# Patient Record
Sex: Male | Born: 1970 | Race: Black or African American | Hispanic: No | Marital: Single | Smoking: Former smoker
Health system: Southern US, Community
[De-identification: ages and names within clinical notes are randomized; demographics above are authoritative.]

## PROBLEM LIST (undated history)

## (undated) DIAGNOSIS — I429 Cardiomyopathy, unspecified: Secondary | ICD-10-CM

## (undated) DIAGNOSIS — I509 Heart failure, unspecified: Secondary | ICD-10-CM

## (undated) DIAGNOSIS — I10 Essential (primary) hypertension: Secondary | ICD-10-CM

## (undated) DIAGNOSIS — I639 Cerebral infarction, unspecified: Secondary | ICD-10-CM

## (undated) DIAGNOSIS — M1990 Unspecified osteoarthritis, unspecified site: Secondary | ICD-10-CM

## (undated) DIAGNOSIS — I428 Other cardiomyopathies: Secondary | ICD-10-CM

## (undated) HISTORY — DX: Cerebral infarction, unspecified: I63.9

---

## 2014-02-01 ENCOUNTER — Encounter (HOSPITAL_COMMUNITY): Payer: Self-pay

## 2014-02-01 ENCOUNTER — Ambulatory Visit (HOSPITAL_COMMUNITY)
Admission: RE | Admit: 2014-02-01 | Discharge: 2014-02-01 | Disposition: A | Discharge status: Home / Self Care | Payer: Medicaid Other | Source: Ambulatory Visit | Attending: Internal Medicine | Admitting: Internal Medicine

## 2014-02-01 VITALS — BP 112/58 | HR 95 | Wt 156.5 lb

## 2014-02-01 DIAGNOSIS — I428 Other cardiomyopathies: Secondary | ICD-10-CM | POA: Diagnosis not present

## 2014-02-01 DIAGNOSIS — I429 Cardiomyopathy, unspecified: Secondary | ICD-10-CM

## 2014-02-01 DIAGNOSIS — M109 Gout, unspecified: Secondary | ICD-10-CM | POA: Diagnosis not present

## 2014-02-01 DIAGNOSIS — E859 Amyloidosis, unspecified: Secondary | ICD-10-CM | POA: Insufficient documentation

## 2014-02-01 HISTORY — DX: Cardiomyopathy, unspecified: I42.9

## 2014-02-01 HISTORY — DX: Other cardiomyopathies: I42.8

## 2014-02-01 HISTORY — DX: Essential (primary) hypertension: I10

## 2014-02-01 HISTORY — DX: Unspecified osteoarthritis, unspecified site: M19.90

## 2014-02-01 LAB — CBC WITH DIFFERENTIAL/PLATELET
Basophils Absolute: 0 10*3/uL (ref 0.0–0.1)
Basophils Relative: 1 % (ref 0–1)
Eosinophils Absolute: 0 10*3/uL (ref 0.0–0.7)
Eosinophils Relative: 0 % (ref 0–5)
HCT: 44.9 % (ref 39.0–52.0)
Hemoglobin: 16.1 g/dL (ref 13.0–17.0)
Lymphocytes Relative: 35 % (ref 12–46)
Lymphs Abs: 1.4 10*3/uL (ref 0.7–4.0)
MCH: 31.8 pg (ref 26.0–34.0)
MCHC: 35.9 g/dL (ref 30.0–36.0)
MCV: 88.6 fL (ref 78.0–100.0)
Monocytes Absolute: 0.4 10*3/uL (ref 0.1–1.0)
Monocytes Relative: 9 % (ref 3–12)
Neutro Abs: 2.2 10*3/uL (ref 1.7–7.7)
Neutrophils Relative %: 55 % (ref 43–77)
Platelets: 168 10*3/uL (ref 150–400)
RBC: 5.07 MIL/uL (ref 4.22–5.81)
RDW: 15.1 % (ref 11.5–15.5)
WBC: 4 10*3/uL (ref 4.0–10.5)

## 2014-02-01 LAB — COMPREHENSIVE METABOLIC PANEL
ALT: 22 U/L (ref 0–53)
AST: 35 U/L (ref 0–37)
Albumin: 4 g/dL (ref 3.5–5.2)
Alkaline Phosphatase: 116 U/L (ref 39–117)
BUN: 8 mg/dL (ref 6–23)
CO2: 21 mEq/L (ref 19–32)
Calcium: 9.2 mg/dL (ref 8.4–10.5)
Chloride: 103 mEq/L (ref 96–112)
Creatinine, Ser: 0.72 mg/dL (ref 0.50–1.35)
GFR calc Af Amer: >90 mL/min (ref >90)
GFR calc non Af Amer: >90 mL/min (ref >90)
Glucose, Bld: 92 mg/dL (ref 70–99)
Potassium: 4.2 mEq/L (ref 3.7–5.3)
Sodium: 141 mEq/L (ref 137–147)
Total Bilirubin: 0.7 mg/dL (ref 0.3–1.2)
Total Protein: 7.9 g/dL (ref 6.0–8.3)

## 2014-02-01 LAB — SEDIMENTATION RATE: Sed Rate: 0 mm/hr (ref 0–16)

## 2014-02-01 LAB — URIC ACID: Uric Acid, Serum: 6.5 mg/dL (ref 4.0–7.8)

## 2014-02-01 LAB — RHEUMATOID FACTOR: Rhuematoid fact SerPl-aCnc: <10 IU/mL (ref <=14)

## 2014-02-01 MED ORDER — PREDNISONE 20 MG PO TABS: 40.0000 mg | ORAL_TABLET | Freq: Every day | ORAL | Status: AC

## 2014-02-01 MED ORDER — ALLOPURINOL 100 MG PO TABS: 200.0000 mg | ORAL_TABLET | Freq: Every day | ORAL | Status: DC

## 2014-02-01 NOTE — Progress Notes (Signed)
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 Gershon Mussel Povsic at [facility] had OHTx at 32 age 61 months old due to reported infiltrative CM (exact diagnosis not reported in records I have). Apparently after that, Charles Hawkins underwent cardiac workup and cMRI suggested infiltrative CM with normal EF. Has had exhaustive w/u since that time under the guidance of Dr. Loyal Buba at [facility] without clear diagnosis. 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 Wife got job in [employer] with [employer] and [place]   Say Dr. Amalia Hailey in hematology and [name] in Rheumatology and he was apaprently told he did not have a systemic problem. Marland Kitchen   cMRI - LVEF 63% moderate concentric LVH 1.6 cm. RV normal. Biatrial enlargement, Diffuse infiltrative process involving both ventricles EM biopsy 11/14 - negative Fat pad biopsy - neagtive Serologies all normal Had ETT in 9/14 (no CPX). 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 After about 20 mins of walking back and legs start hurting and limit him. No edema. No syncope. Occasional dizziness. Denies DOE. He denies FHx of HF except for his son.   He is very anxious and worried he is going to die.  Review of Systems: [y] = yes, [ ]  = no   General: Weight gain [ ] ; Weight loss [ ] ; Anorexia [ ] ; Fatigue Blue.Reese ]; Fever [ ] ; Chills [ ] ; Weakness [ ]   Cardiac: Chest pain/pressure [ ] ; Resting SOB [ ] ; Exertional SOB [ ] ; Orthopnea [ ] ; Pedal Edema [ ] ; Palpitations [ ] ; Syncope [ ] ; Presyncope [ ] ; Paroxysmal nocturnal dyspnea[ ]   Pulmonary: Cough [ ] ; Wheezing[ ] ; Hemoptysis[ ] ; Sputum [ ] ; Snoring [ ]   GI: Vomiting[ ] ; Dysphagia[ ] ; Melena[ ] ; Hematochezia [ ] ; Heartburn[ ] ; Abdominal pain [ ] ; Constipation [ ] ;  Diarrhea [ ] ; BRBPR [ ]   GU: Hematuria[ ] ; Dysuria [ ] ; Nocturia[ ]   Vascular: Pain in legs with walking Blue.Reese ]; Pain in feet with lying flat [ ] ; Non-healing sores [ ] ; Stroke [ ] ; TIA [ ] ; Slurred speech [ ] ;  Neuro: Headaches[ ] ; Vertigo[ ] ; Seizures[ ] ; Paresthesias[ ] ;Blurred vision [ ] ; Diplopia [ ] ; Vision changes [ ]   Ortho/Skin: Arthritis Blue.Reese ]; Joint pain Blue.Reese ]; Muscle pain [ ] ; Joint swelling [ y]; Back Pain [ ] ; Rash [ ]   Psych: Depression[ ] ; Anxiety[y ]  Heme: Bleeding problems [ ] ; Clotting disorders [ ] ; Anemia [ ]   Endocrine: Diabetes [ ] ; Thyroid dysfunction[ ]    Past Medical History  Diagnosis Date  . Infiltrative cardiomyopathy     EF normal. Multiple areas of subendocardial enhancement on MRI. Fat pad and myocardial biopsy negative at [facility].   Marland Kitchen HTN (hypertension)   . Arthritis     Current Outpatient Prescriptions  Medication Sig Dispense Refill  . amLODipine (NORVASC) 5 MG tablet Take 5 mg by mouth daily.      . Multiple Vitamin (MULTIVITAMIN) tablet Take 1 tablet by mouth daily.      Marland Kitchen allopurinol (ZYLOPRIM) 100 MG tablet Take 2 tablets (200 mg total) by mouth daily.  60 tablet  6   No current facility-administered medications for this  encounter.    No Known Allergies  History   Social History  . Marital Status: Single    Spouse Name: N/A    Number of Children: N/A  . Years of Education: N/A   Occupational History  . Not on file.   Social History Main Topics  . Smoking status: Current Every Day Smoker -- 0.25 packs/day    Types: Cigarettes  . Smokeless tobacco: Not on file  . Alcohol Use: Yes     Comment: occasionally  . Drug Use: Yes     Comment: marijuana occasionally  . Sexual Activity: Not on file   Other Topics Concern  . Not on file   Social History Narrative  . No narrative on file    Family History  Problem Relation Age of Onset  . Heart failure Son 1    s/p heart transplant  . Stroke Brother 66    Deceased  . Stroke Mother   .  Hypertension Father     PHYSICAL EXAM: Filed Vitals:   02/01/14 1042  BP: 112/58  Pulse: 95  Weight: 156 lb 8 oz (70.988 kg)  SpO2: 100%    General:  Well appearing. No respiratory difficulty HEENT: normal Neck: supple. no JVD. Carotids 2+ bilat; no bruits. No lymphadenopathy or thryomegaly appreciated. Cor: PMI nondisplaced.tachy + s4 no murmur Lungs: clear Abdomen: soft, nontender, nondistended. No hepatosplenomegaly. No bruits or masses. Good bowel sounds. Extremities: braces on LE. no cyanosis, clubbing, rash, edema L 4-5 th fingers DIP and PIP swollen tender. + gout nodule on L elbow,  Neuro: alert & oriented x 3, cranial nerves grossly intact. moves all 4 extremities w/o difficulty. Affect pleasant.   ASSESSMENT & PLAN:  1. 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 Gout, acute 3. HTN - controlled 4. Arthritis  Very complicated situation. cMRI is very suggestive of infiltrative process but an extensive, multidisciplinary work-up at [facility] including endomyocardial biopsy has been completely unrevealing. In addition his LV function remains normal and he has no clinical signs or symptoms of HF. On exam today has evidence of acute gout in L hand. We will start with CPX testing as tolearted by his arthritis to assess for HF limitation. We then have to decide between repeating his cMRI or EM biopsy. I think it may be reasonable to repeat cMRI to look for persistent hyperenhancement versus resolving myocarditis. If hyperenhancement persists will need repeat EM biopsy. I will also attempt to get records from [place] to get further details on his work-up to date. Will start prednisone 40mg  daily for 3 days for acute gout and add allopurinol 200 daily. Check labs including uric acid.   Time spent 50 minutes. With about 1/2 that time discussing the above with patient and the other half reviewing records that came with him from [place].   Shaune Pascal Leman Martinek,MD 10:52 PM

## 2014-02-01 NOTE — Patient Instructions (Signed)
Prednisone 40 mg (2 tabs) daily for 3 days   Allopurinol 200 mg (2 tabs) daily  Labs today  Your physician has recommended that you have a cardiopulmonary stress test (CPX). CPX testing is a non-invasive measurement of heart and lung function. It replaces a traditional treadmill stress test. This type of test provides a tremendous amount of information that relates not only to your present condition but also for future outcomes. This test combines measurements of you ventilation, respiratory gas exchange in the lungs, electrocardiogram (EKG), blood pressure and physical response before, during, and following an exercise protocol.  Your physician recommends that you schedule a follow-up appointment in: 3-4 weeks

## 2014-02-02 LAB — ANA: Anti Nuclear Antibody(ANA): NEGATIVE

## 2014-02-06 ENCOUNTER — Encounter (HOSPITAL_COMMUNITY): Payer: Self-pay

## 2014-02-06 DIAGNOSIS — I429 Cardiomyopathy, unspecified: Secondary | ICD-10-CM | POA: Insufficient documentation

## 2014-02-06 DIAGNOSIS — I428 Other cardiomyopathies: Secondary | ICD-10-CM | POA: Insufficient documentation

## 2014-02-06 DIAGNOSIS — M109 Gout, unspecified: Secondary | ICD-10-CM | POA: Insufficient documentation

## 2014-02-15 ENCOUNTER — Ambulatory Visit (HOSPITAL_COMMUNITY): Payer: Medicaid Other | Attending: Internal Medicine

## 2014-02-15 DIAGNOSIS — I428 Other cardiomyopathies: Secondary | ICD-10-CM

## 2014-02-15 DIAGNOSIS — E859 Amyloidosis, unspecified: Secondary | ICD-10-CM | POA: Insufficient documentation

## 2014-02-22 ENCOUNTER — Encounter (HOSPITAL_COMMUNITY): Payer: Medicaid Other

## 2014-02-22 ENCOUNTER — Other Ambulatory Visit (HOSPITAL_COMMUNITY): Payer: Self-pay | Admitting: Cardiology

## 2014-02-22 DIAGNOSIS — I428 Other cardiomyopathies: Secondary | ICD-10-CM

## 2014-02-26 ENCOUNTER — Encounter: Payer: Self-pay | Admitting: Cardiology

## 2014-03-03 ENCOUNTER — Encounter (HOSPITAL_COMMUNITY): Payer: Self-pay

## 2014-03-03 ENCOUNTER — Ambulatory Visit (HOSPITAL_COMMUNITY)
Admission: RE | Admit: 2014-03-03 | Discharge: 2014-03-03 | Disposition: A | Discharge status: Home / Self Care | Payer: Medicaid Other | Source: Ambulatory Visit | Attending: Internal Medicine | Admitting: Internal Medicine

## 2014-03-03 VITALS — BP 90/64 | HR 97 | Wt 151.4 lb

## 2014-03-03 DIAGNOSIS — I428 Other cardiomyopathies: Secondary | ICD-10-CM | POA: Diagnosis not present

## 2014-03-03 DIAGNOSIS — I509 Heart failure, unspecified: Secondary | ICD-10-CM | POA: Insufficient documentation

## 2014-03-03 DIAGNOSIS — I5033 Acute on chronic diastolic (congestive) heart failure: Secondary | ICD-10-CM | POA: Insufficient documentation

## 2014-03-03 DIAGNOSIS — I5032 Chronic diastolic (congestive) heart failure: Secondary | ICD-10-CM | POA: Diagnosis not present

## 2014-03-03 DIAGNOSIS — I10 Essential (primary) hypertension: Secondary | ICD-10-CM | POA: Insufficient documentation

## 2014-03-03 DIAGNOSIS — I429 Cardiomyopathy, unspecified: Secondary | ICD-10-CM

## 2014-03-03 DIAGNOSIS — R002 Palpitations: Secondary | ICD-10-CM | POA: Diagnosis not present

## 2014-03-03 MED ORDER — CARVEDILOL 3.125 MG PO TABS: 3.1250 mg | ORAL_TABLET | Freq: Two times a day (BID) | ORAL | Status: DC

## 2014-03-03 NOTE — Progress Notes (Signed)
Patient ID: Charles Hawkins, male   DOB: Apr 05, 1971, 43 y.o.   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 Gershon Mussel Povsic at [facility] for further evaluation. I first saw him in 4/15.   Son had OHTx at 22 age 27 months old due to reported infiltrative CM (exact diagnosis not reported in records I have). Apparently after that, Charles Hawkins underwent cardiac workup and cMRI suggested infiltrative CM with normal EF. Has had exhaustive w/u since that time under the guidance of Dr. Loyal Buba at [facility] without clear diagnosis. 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 Wife got job in [employer] with [employer] and [place]. He denies FHx of HF except for his son  Say Dr. Amalia Hailey in hematology and [name] in Rheumatology and he was apaprently told he did not have a systemic problem.   cMRI - LVEF 63% moderate concentric LVH 1.6 cm. RV normal. Biatrial enlargement, Diffuse infiltrative process involving both ventricles EM biopsy 11/14 - negative Fat pad biopsy - neagtive Serologies all normal Had ETT in 9/14 (no CPX). 7.6 METs HR 76-156  He is here for follow-up. Gets winded with mild activity. Occasional episodes of chest pressure can happen at any time. Mild DOE. Losing weight.  No edema. Occasional dizziness. Frequent palpitations + presyncope. Lasts 2-3 minutes. BP remains low. Schedule for repeat cMRI this month.   Had CPX 4/15 which showed significant cardiac limitation  Resting HR: 75 Peak HR: 130 (72% age predicted max HR) BP rest: 102/58 BP peak: 128/62 Peak VO2: 13.9 (38.8% predicted peak VO2) VE/VCO2 slope: 41.4 OUES: 1.09 Peak RER: 1.13 Ventilatory Threshold: 10.2 (28.5% predicted peak VO2) Peak RR 32 Peak Ventilation: 46.3 VE/MVV: 32.2% PETCO2 at peak: 26 O2pulse: 8 (57% predicted O2pulse)   Current Outpatient  Prescriptions on File Prior to Encounter  Medication Sig Dispense Refill  . allopurinol (ZYLOPRIM) 100 MG tablet Take 2 tablets (200 mg total) by mouth daily.  60 tablet  6  . amLODipine (NORVASC) 5 MG tablet Take 5 mg by mouth daily.      . Multiple Vitamin (MULTIVITAMIN) tablet Take 1 tablet by mouth daily.       No current facility-administered medications on file prior to encounter.    PHYSICAL EXAM: Filed Vitals:   03/03/14 0947  BP: 90/64  Pulse: 97  Weight: 151 lb 6.4 oz (68.675 kg)  SpO2: 97%    General:  Well appearing. No respiratory difficulty. Walks with canf HEENT: normal Neck: supple. no JVD. Carotids 2+ bilat; no bruits. No lymphadenopathy or thryomegaly appreciated. Cor: PMI nondisplaced.RRR no murmur Lungs: clear Abdomen: soft, nontender, nondistended. No hepatosplenomegaly. No bruits or masses. Good bowel sounds. Extremities: braces on LE. no cyanosis, clubbing, rash, edema + gout nodules  Neuro: alert & oriented x 3, cranial nerves grossly intact. moves all 4 extremities w/o difficulty. Affect pleasant.   ASSESSMENT & PLAN:  1. 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 Chronic diastolic HF 3. Palpitations with presyncope 4. Gout, 5. HTN -  BP now low   Very complicated situation. cMRI is very suggestive of infiltrative process but an extensive, multidisciplinary work-up at [facility] including endomyocardial biopsy has been completely unrevealing. However, CPX reveals severe cardiac limitation. Will repeat cMRI in next week or two and then refer back  to [facility] for repeat endomyocardial biopsy. He is at high risk for cardiac arrhythmias will place event monitor to evaluate palpitations. Stop amlodipine. Add low dose carvedilol 3.125 bid.   Shaune Pascal Iori Gigante,MD 10:23 AM

## 2014-03-03 NOTE — Patient Instructions (Signed)
Stop Amlodipine (Norvasc)  Start Carvedilol 3.125 mg Twice daily   Your physician has recommended that you wear an event monitor. Event monitors are medical devices that record the heart's electrical activity. Doctors most often [place] these monitors to diagnose arrhythmias. Arrhythmias are problems with the speed or rhythm of the heartbeat. The monitor is a small, portable device. You can wear one while you do your normal daily activities. This is usually used to diagnose what is causing palpitations/syncope (passing out).  Your physician recommends that you schedule a follow-up appointment in: 3-4 weeks  Cardiac MRI is scheduled for 5/18 we will try to move this up

## 2014-03-03 NOTE — Addendum Note (Signed)
Encounter addended by: Scarlette Calico, RN on: 03/03/2014 10:47 AM<BR>     Documentation filed: Medications, Patient Instructions Section, Orders

## 2014-03-05 ENCOUNTER — Ambulatory Visit (HOSPITAL_COMMUNITY)
Admission: RE | Admit: 2014-03-05 | Discharge: 2014-03-05 | Disposition: A | Discharge status: Home / Self Care | Payer: Medicaid Other | Source: Ambulatory Visit | Attending: Cardiology | Admitting: Cardiology

## 2014-03-05 DIAGNOSIS — I428 Other cardiomyopathies: Secondary | ICD-10-CM

## 2014-03-05 MED ORDER — GADOBENATE DIMEGLUMINE 529 MG/ML IV SOLN
20.0000 mL | Freq: Once | INTRAVENOUS | Status: AC
  Administered 2014-03-05: 20 mL via INTRAVENOUS

## 2014-03-08 ENCOUNTER — Encounter: Payer: Self-pay | Admitting: *Deleted

## 2014-03-08 ENCOUNTER — Encounter (INDEPENDENT_AMBULATORY_CARE_PROVIDER_SITE_OTHER): Payer: Medicaid Other

## 2014-03-08 DIAGNOSIS — R002 Palpitations: Secondary | ICD-10-CM

## 2014-03-08 NOTE — Progress Notes (Signed)
Patient ID: Charles Hawkins, male   DOB: 06/30/1971, 43 y.o.   MRN: [id number] E-Cardio verite 30 day cardiac event monitor applied to patient.

## 2014-03-15 ENCOUNTER — Inpatient Hospital Stay (HOSPITAL_COMMUNITY): Admission: RE | Admit: 2014-03-15 | Payer: Medicaid Other | Source: Ambulatory Visit

## 2014-04-01 ENCOUNTER — Inpatient Hospital Stay (HOSPITAL_COMMUNITY): Admission: RE | Admit: 2014-04-01 | Payer: Medicaid Other | Source: Ambulatory Visit

## 2014-05-14 ENCOUNTER — Telehealth (HOSPITAL_COMMUNITY): Payer: Self-pay | Admitting: *Deleted

## 2014-05-14 NOTE — Telephone Encounter (Signed)
Per Dr Vaughan Browner pt has been referred to [facility] for Heart Transplant Eval, records faxed to them at [phone]

## 2014-11-08 ENCOUNTER — Ambulatory Visit (HOSPITAL_COMMUNITY)
Admission: RE | Admit: 2014-11-08 | Discharge: 2014-11-08 | Disposition: A | Discharge status: Home / Self Care | Payer: Medicaid Other | Source: Ambulatory Visit | Attending: Internal Medicine | Admitting: Internal Medicine

## 2014-11-08 ENCOUNTER — Encounter (HOSPITAL_COMMUNITY): Payer: Self-pay

## 2014-11-08 VITALS — BP 120/78 | HR 112 | Wt 161.4 lb

## 2014-11-08 DIAGNOSIS — I493 Ventricular premature depolarization: Secondary | ICD-10-CM | POA: Insufficient documentation

## 2014-11-08 DIAGNOSIS — Z79899 Other long term (current) drug therapy: Secondary | ICD-10-CM | POA: Insufficient documentation

## 2014-11-08 DIAGNOSIS — M109 Gout, unspecified: Secondary | ICD-10-CM | POA: Diagnosis not present

## 2014-11-08 DIAGNOSIS — I5032 Chronic diastolic (congestive) heart failure: Secondary | ICD-10-CM | POA: Insufficient documentation

## 2014-11-08 DIAGNOSIS — I425 Other restrictive cardiomyopathy: Secondary | ICD-10-CM | POA: Insufficient documentation

## 2014-11-08 DIAGNOSIS — I10 Essential (primary) hypertension: Secondary | ICD-10-CM | POA: Insufficient documentation

## 2014-11-08 DIAGNOSIS — I517 Cardiomegaly: Secondary | ICD-10-CM | POA: Insufficient documentation

## 2014-11-08 DIAGNOSIS — I4589 Other specified conduction disorders: Secondary | ICD-10-CM | POA: Insufficient documentation

## 2014-11-08 DIAGNOSIS — R002 Palpitations: Secondary | ICD-10-CM | POA: Insufficient documentation

## 2014-11-08 DIAGNOSIS — Z720 Tobacco use: Secondary | ICD-10-CM | POA: Diagnosis not present

## 2014-11-08 DIAGNOSIS — R000 Tachycardia, unspecified: Secondary | ICD-10-CM | POA: Insufficient documentation

## 2014-11-08 LAB — BASIC METABOLIC PANEL
Anion gap: 7 (ref 5–15)
BUN: 6 mg/dL (ref 6–23)
CO2: 24 mmol/L (ref 19–32)
Calcium: 8.8 mg/dL (ref 8.4–10.5)
Chloride: 101 mEq/L (ref 96–112)
Creatinine, Ser: 0.84 mg/dL (ref 0.50–1.35)
GFR calc Af Amer: >90 mL/min (ref >90)
GFR calc non Af Amer: >90 mL/min (ref >90)
Glucose, Bld: 93 mg/dL (ref 70–99)
Potassium: 4.1 mmol/L (ref 3.5–5.1)
Sodium: 132 mmol/L — ABNORMAL LOW (ref 135–145)

## 2014-11-08 LAB — BRAIN NATRIURETIC PEPTIDE: B Natriuretic Peptide: 2358.6 pg/mL — ABNORMAL HIGH (ref 0.0–100.0)

## 2014-11-08 MED ORDER — CARVEDILOL 6.25 MG PO TABS: 3.1250 mg | ORAL_TABLET | Freq: Two times a day (BID) | ORAL | Status: DC

## 2014-11-08 MED ORDER — ALDACTONE 25 MG PO TABS: 25.0000 mg | ORAL_TABLET | Freq: Every day | ORAL | Status: DC

## 2014-11-08 NOTE — Addendum Note (Signed)
Encounter addended by: Kerry Dory, CMA on: 11/08/2014 10:59 AM<BR>     Documentation filed: Dx Association, Patient Instructions Section, Orders

## 2014-11-08 NOTE — Patient Instructions (Addendum)
Your physician has requested that you have an echocardiogram. Echocardiography is a painless test that uses sound waves to create images of your heart. It provides your doctor with information about the size and shape of your heart and how well your heart's chambers and valves are working. This procedure takes approximately one hour. There are no restrictions for this procedure.  Your physician has recommended that you wear an event monitor. Event monitors are medical devices that record the heart's electrical activity. Doctors most often [place] these monitors to diagnose arrhythmias. Arrhythmias are problems with the speed or rhythm of the heartbeat. The monitor is a small, portable device. You can wear one while you do your normal daily activities. This is usually used to diagnose what is causing palpitations/syncope (passing out).   INCREASE Carvedilol to 6.25 mg twice a day START Spironolactone 25 mg daily  Labs today and again in 1 week (BMET)  Your physician recommends that you schedule a follow-up appointment in: 3-4 weeks  Do the following things EVERYDAY: 1) Weigh yourself in the morning before breakfast. Write it down and keep it in a log. 2) Take your medicines as prescribed 3) Eat low salt foods-Limit salt (sodium) to 2000 mg per day.  4) Stay as active as you can everyday 5) Limit all fluids for the day to less than 2 liters 6)

## 2014-11-08 NOTE — Addendum Note (Signed)
Encounter addended by: Jolaine Artist, MD on: 11/08/2014  2:59 PM<BR>     Documentation filed: Notes Section

## 2014-11-08 NOTE — Progress Notes (Addendum)
Patient ID: Charles Hawkins, male   DOB: February 11, 1971, 44 y.o.   MRN: [id number]   ADVANCED HF CLINIC CONSULT NOTE  HPI:  Charles Hawkins is a 44 y/o male with h/o HTN, h/o bilateral club feet s/p surgical repair, gout, arthritis and restrictive (?infiltrative) CM referred by Dr. Gershon Mussel Povsic at [facility] for further evaluation. I first saw him in 4/15.   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 Apparently after that, Charles Hawkins underwent cardiac workup and cMRI suggested infiltrative CM with normal EF. Has had exhaustive w/u since that time under the guidance of Dr. Loyal Buba at [facility] without clear diagnosis. 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 Wife got job in [employer] with [employer] and [place]. He denies FHx of HF except for his son  Say Dr. Amalia Hailey in hematology and [name] in Rheumatology and he was apaprently told he did not have a systemic problem.   cMRI - LVEF 63% moderate concentric LVH 1.6 cm. RV normal. Biatrial enlargement, Diffuse infiltrative process involving both ventricles EM biopsy 11/14 - negative Fat pad biopsy - neagtive Serologies all normal Had ETT in 9/14 (no CPX). 7.6 METs HR 76-156 Event monitor 5/15; SR with PVCs Repeat cMRI in 5/15      1. Normal LV size and systolic function, EF 89%. Mild LV hypertrophy. Normal RV size and systolic function.    2. Moderate to severe biatrial enlargement.    3. Evidence for diffuse infiltrative process by delayed enhancement imaging, possible cardiac amyloidosis.  Had CPX 4/15 which showed significant cardiac limitation  Resting HR: 75 Peak HR: 130 (72% age predicted max HR) BP rest: 102/58 BP peak: 128/62 Peak VO2: 13.9 (38.8% predicted peak VO2) VE/VCO2 slope: 41.4 OUES: 1.09 Peak RER: 1.13 Ventilatory Threshold: 10.2 (28.5% predicted peak VO2) Peak RR 32 Peak Ventilation: 46.3 VE/MVV: 32.2% PETCO2 at peak: 26 O2pulse: 8 (57%  predicted O2pulse)  Follow-up: Saw Dr. Posey Pronto at [facility] on 06/04/14. They suggested stopping smoking and starting process for OHTx work-up. He has not followed up. Says he is overwhelmed with the process and very anxious and having hard time sleeping. Not weighing everyday. Weight 10 pounds up here since May. + edema. +DOE on 100 yards. No syncope. + severe arthritis. Continue to have tachypalpitations every day. Down to 2 cigarettes per day.    Current Outpatient Prescriptions on File Prior to Encounter  Medication Sig Dispense Refill  . carvedilol (COREG) 3.125 MG tablet Take 1 tablet (3.125 mg total) by mouth 2 (two) times daily. 60 tablet 3  . Multiple Vitamin (MULTIVITAMIN) tablet Take 1 tablet by mouth daily.     No current facility-administered medications on file prior to encounter.    PHYSICAL EXAM: Filed Vitals:   11/08/14 1029  BP: 120/78  Pulse: 112  Weight: 161 lb 6.4 oz (73.211 kg)  SpO2: 95%    General:  Well appearing. No respiratory difficulty.  HEENT: normal Neck: supple. JVP 9-10. Carotids 2+ bilat; no bruits. No lymphadenopathy or thryomegaly appreciated. Cor: PMI nondisplaced.Tachy regular. No s3 Lungs: clear Abdomen: soft, nontender, nondistended. No hepatosplenomegaly. No bruits or masses. Good bowel sounds. Extremities: braces on LE. no cyanosis, clubbing, rash, tr-1+ edema L>R  + gout nodules  Neuro: alert & oriented x 3, cranial nerves grossly intact. moves all 4 extremities w/o difficulty. Affect pleasant.   ASSESSMENT & PLAN:  1. 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 Chronic diastolic HF 3. Palpitations 4. Gout, 5. HTN 6. Tobacco use  cMRI is very suggestive of infiltrative process but an extensive, multidisciplinary work-up at [facility] including endomyocardial biopsy which has not confirmed infiltrative process. He remains with NYHA Class III symptoms with volume overload. Will start spiro  25 daily. Increase carvedilol to 6.25 bid. Will repeat echo. Stressed need to start transplant process through [place] will likely need repeat CPX as well. Add ACE-I at next visit if labs stable after spiro. Blood work today and in 10 days.   RTC in 3-4 weeks.   Lashawn Bromwell,MD 10:33 AM  Addendum: ECG shows sinus rhythm with run of atrial tach. Will place event monitor.   Pamila Mendibles,MD 2:59 PM

## 2014-11-08 NOTE — Addendum Note (Signed)
Encounter addended by: Kerry Dory, CMA on: 11/08/2014 11:25 AM<BR>     Documentation filed: Dx Association, Patient Instructions Section, Orders

## 2014-11-08 NOTE — Addendum Note (Signed)
Encounter addended by: Kerry Dory, CMA on: 11/08/2014 11:21 AM<BR>     Documentation filed: Dx Association, Orders

## 2014-11-09 NOTE — Addendum Note (Signed)
Encounter addended by: Melissa Montane, NT on: 11/09/2014 12:20 PM<BR>     Documentation filed: Charges VN

## 2014-11-17 ENCOUNTER — Encounter (INDEPENDENT_AMBULATORY_CARE_PROVIDER_SITE_OTHER): Payer: Medicaid Other

## 2014-11-17 ENCOUNTER — Ambulatory Visit (HOSPITAL_COMMUNITY)
Admission: RE | Admit: 2014-11-17 | Discharge: 2014-11-17 | Disposition: A | Discharge status: Home / Self Care | Payer: Medicaid Other | Source: Ambulatory Visit | Attending: Internal Medicine | Admitting: Internal Medicine

## 2014-11-17 ENCOUNTER — Encounter: Payer: Self-pay | Admitting: *Deleted

## 2014-11-17 DIAGNOSIS — R002 Palpitations: Secondary | ICD-10-CM | POA: Diagnosis not present

## 2014-11-17 DIAGNOSIS — I5032 Chronic diastolic (congestive) heart failure: Secondary | ICD-10-CM | POA: Diagnosis not present

## 2014-11-17 DIAGNOSIS — I5022 Chronic systolic (congestive) heart failure: Secondary | ICD-10-CM

## 2014-11-17 LAB — BASIC METABOLIC PANEL
Anion gap: 12 (ref 5–15)
BUN: 8 mg/dL (ref 6–23)
CO2: 23 mmol/L (ref 19–32)
Calcium: 9.1 mg/dL (ref 8.4–10.5)
Chloride: 103 mEq/L (ref 96–112)
Creatinine, Ser: 0.86 mg/dL (ref 0.50–1.35)
GFR calc Af Amer: >90 mL/min (ref >90)
GFR calc non Af Amer: >90 mL/min (ref >90)
Glucose, Bld: 95 mg/dL (ref 70–99)
Potassium: 4.6 mmol/L (ref 3.5–5.1)
Sodium: 138 mmol/L (ref 135–145)

## 2014-11-17 NOTE — Progress Notes (Signed)
Patient ID: Charles Hawkins, male   DOB: [birth year], 44 y.o.   MRN: [id number] [employer] 30 day cardiac event monitor applied to patient.

## 2014-11-18 ENCOUNTER — Telehealth (HOSPITAL_COMMUNITY): Payer: Self-pay | Admitting: *Deleted

## 2014-11-18 NOTE — Telephone Encounter (Signed)
Received call from monitor company, pt is wearing 30 day event monitor, they report pt had a 1 min run of SVT at 162, pt now is in SR w/PACs at 82, they did attempt to call pt to check on him with no answer, Dr Haroldine Laws is aware

## 2014-12-06 ENCOUNTER — Telehealth (HOSPITAL_COMMUNITY): Payer: Self-pay | Admitting: Vascular Surgery

## 2014-12-06 ENCOUNTER — Ambulatory Visit (HOSPITAL_BASED_OUTPATIENT_CLINIC_OR_DEPARTMENT_OTHER)
Admission: RE | Admit: 2014-12-06 | Discharge: 2014-12-06 | Disposition: A | Discharge status: Home / Self Care | Payer: Medicaid Other | Source: Ambulatory Visit | Attending: Internal Medicine | Admitting: Internal Medicine

## 2014-12-06 ENCOUNTER — Ambulatory Visit (HOSPITAL_COMMUNITY)
Admission: RE | Admit: 2014-12-06 | Discharge: 2014-12-06 | Disposition: A | Discharge status: Home / Self Care | Payer: Medicaid Other | Source: Ambulatory Visit | Attending: Internal Medicine | Admitting: Internal Medicine

## 2014-12-06 ENCOUNTER — Encounter (HOSPITAL_COMMUNITY): Payer: Self-pay

## 2014-12-06 VITALS — BP 124/106 | HR 88 | Wt 157.8 lb

## 2014-12-06 DIAGNOSIS — I5032 Chronic diastolic (congestive) heart failure: Secondary | ICD-10-CM

## 2014-12-06 DIAGNOSIS — I4892 Unspecified atrial flutter: Secondary | ICD-10-CM | POA: Insufficient documentation

## 2014-12-06 DIAGNOSIS — I340 Nonrheumatic mitral (valve) insufficiency: Secondary | ICD-10-CM | POA: Insufficient documentation

## 2014-12-06 DIAGNOSIS — Z720 Tobacco use: Secondary | ICD-10-CM | POA: Insufficient documentation

## 2014-12-06 DIAGNOSIS — I10 Essential (primary) hypertension: Secondary | ICD-10-CM | POA: Insufficient documentation

## 2014-12-06 DIAGNOSIS — I425 Other restrictive cardiomyopathy: Secondary | ICD-10-CM | POA: Insufficient documentation

## 2014-12-06 DIAGNOSIS — M109 Gout, unspecified: Secondary | ICD-10-CM | POA: Diagnosis not present

## 2014-12-06 DIAGNOSIS — I509 Heart failure, unspecified: Secondary | ICD-10-CM

## 2014-12-06 DIAGNOSIS — Z79899 Other long term (current) drug therapy: Secondary | ICD-10-CM | POA: Diagnosis not present

## 2014-12-06 LAB — COMPREHENSIVE METABOLIC PANEL
ALT: 28 U/L (ref 0–53)
AST: 56 U/L — ABNORMAL HIGH (ref 0–37)
Albumin: 3.7 g/dL (ref 3.5–5.2)
Alkaline Phosphatase: 242 U/L — ABNORMAL HIGH (ref 39–117)
Anion gap: 7 (ref 5–15)
BUN: 7 mg/dL (ref 6–23)
CO2: 27 mmol/L (ref 19–32)
Calcium: 9.1 mg/dL (ref 8.4–10.5)
Chloride: 102 mmol/L (ref 96–112)
Creatinine, Ser: 0.89 mg/dL (ref 0.50–1.35)
GFR calc Af Amer: >90 mL/min (ref >90)
GFR calc non Af Amer: >90 mL/min (ref >90)
Glucose, Bld: 98 mg/dL (ref 70–99)
Potassium: 4.2 mmol/L (ref 3.5–5.1)
Sodium: 136 mmol/L (ref 135–145)
Total Bilirubin: 2.1 mg/dL — ABNORMAL HIGH (ref 0.3–1.2)
Total Protein: 7.7 g/dL (ref 6.0–8.3)

## 2014-12-06 LAB — CBC
HCT: 44.1 % (ref 39.0–52.0)
Hemoglobin: 14.8 g/dL (ref 13.0–17.0)
MCH: 30.9 pg (ref 26.0–34.0)
MCHC: 33.6 g/dL (ref 30.0–36.0)
MCV: 92.1 fL (ref 78.0–100.0)
Platelets: 162 10*3/uL (ref 150–400)
RBC: 4.79 MIL/uL (ref 4.22–5.81)
RDW: 16 % — ABNORMAL HIGH (ref 11.5–15.5)
WBC: 3.9 10*3/uL — ABNORMAL LOW (ref 4.0–10.5)

## 2014-12-06 MED ORDER — CARVEDILOL 6.25 MG PO TABS: 6.2500 mg | ORAL_TABLET | Freq: Two times a day (BID) | ORAL | Status: DC

## 2014-12-06 MED ORDER — AMIODARONE HCL 200 MG PO TABS: 200.0000 mg | ORAL_TABLET | Freq: Two times a day (BID) | ORAL | Status: DC

## 2014-12-06 MED ORDER — APIXABAN 5 MG PO TABS: 5.0000 mg | ORAL_TABLET | Freq: Two times a day (BID) | ORAL | Status: DC

## 2014-12-06 NOTE — Addendum Note (Signed)
Encounter addended by: Scarlette Calico, RN on: 12/06/2014 11:01 AM<BR>     Documentation filed: Dx Association, Patient Instructions Section, Orders

## 2014-12-06 NOTE — Progress Notes (Signed)
  Echocardiogram 2D Echocardiogram has been performed.  Johny Chess 12/06/2014, 9:47 AM

## 2014-12-06 NOTE — Telephone Encounter (Signed)
Open in error

## 2014-12-06 NOTE — Progress Notes (Signed)
Patient ID: Charles [facility], male   DOB: 1971-08-03, 44 y.o.   MRN: [id number]   ADVANCED HF CLINIC CONSULT NOTE  HPI:  Charles [facility] is a 44 y/o male with h/o HTN, h/o bilateral club feet s/p surgical repair, gout, arthritis and restrictive (?infiltrative) CM referred by Dr. Gershon Mussel [facility] at [facility] for further evaluation. I first saw him in 4/15.   Son had OHTx at 55 age 66 months old due to reported infiltrative CM (exact diagnosis not reported in records I have). Apparently after that, [name] underwent cardiac workup and cMRI suggested infiltrative CM with normal EF. Has had exhaustive w/u since that time under the guidance of Dr. Loyal [facility] at [facility] without clear diagnosis. 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 Wife got job in [employer] with [facility] and he has now relocated to the [facility]. He denies FHx of HF except for his son  Say [facility] in hematology and [facility] in Rheumatology and he was apaprently told he did not have a systemic problem.   cMRI - LVEF 63% moderate concentric LVH 1.6 cm. RV normal. Biatrial enlargement, Diffuse infiltrative process involving both ventricles EM biopsy 11/14 - negative Fat pad biopsy - neagtive Serologies all normal Had ETT in 9/14 (no CPX). 7.6 METs HR 76-156 Event monitor 5/15; SR with PVCs Repeat cMRI in 5/15      1. Normal LV size and systolic function, EF 08%. Mild LV hypertrophy. Normal RV size and systolic function.    2. Moderate to severe biatrial enlargement.    3. Evidence for diffuse infiltrative process by delayed enhancement imaging, possible cardiac amyloidosis.  Had CPX 4/15 which showed significant cardiac limitation  Resting HR: 75 Peak HR: 130 (72% age predicted max HR) BP rest: 102/58 BP peak: 128/62 Peak VO2: 13.9 (38.8% predicted peak VO2) VE/VCO2 slope: 41.4 OUES: 1.09 Peak RER: 1.13 Ventilatory Threshold: 10.2 (28.5% predicted peak VO2) Peak RR 32 Peak Ventilation: 46.3 VE/MVV: 32.2% PETCO2 at peak: 26 O2pulse: 8 (57%  predicted O2pulse)  Follow-up: Saw Dr. Posey [facility] at [facility] on 06/04/14. They suggested stopping smoking and starting process for OHTx work-up. He has not followed up. I saw him last month and started spiro 25 daily and increased carvedilol to 6.25 bid. Event monitor also placed for tachypalpitations Which last for a few minutes. Continues with Class III-IIIB symptoms with dyspnea any exertion. Gets sick to his stomach after am carvedilol. Denies edema. Weight down 4 pounds. Continues smoking 3 cigarettes per day.   Monitor reviewed with multiple runs of atrial tach/flutter 150-160s.  Echo reviewed from today: Shows thick RV and LV with starry night pattern. LFVEF 50% RV moderate HK. Severe biatrial enlargement.     Current Outpatient Prescriptions on File Prior to Encounter  Medication Sig Dispense Refill  . ALDACTONE 25 MG tablet Take 1 tablet (25 mg total) by mouth daily. 30 tablet 3  . carvedilol (COREG) 6.25 MG tablet Take 0.5 tablets (3.125 mg total) by mouth 2 (two) times daily. (Patient taking differently: Take 6.25 mg by mouth 2 (two) times daily. ) 60 tablet 3  . Multiple Vitamin (MULTIVITAMIN) tablet Take 1 tablet by mouth daily.    . pregabalin (LYRICA) 75 MG capsule Take 75 mg by mouth 2 (two) times daily.     No current facility-administered medications on file prior to encounter.    PHYSICAL EXAM: Filed Vitals:   12/06/14 0957  BP: 124/106  Pulse: 88  Weight: 157 lb 12.8 oz (71.578 kg)  SpO2: 98%    General:  Well appearing. No respiratory difficulty.  HEENT: normal Neck: supple. JVP 7 Carotids 2+ bilat; no bruits. No lymphadenopathy or thryomegaly appreciated. Cor: PMI nondisplaced. Regular. + s4No s3 Lungs: clear Abdomen: soft, nontender, nondistended. No hepatosplenomegaly. No bruits or masses. Good bowel sounds. Extremities: braces on LE. no cyanosis, clubbing, rash, no edema L>R  + gout nodules  Neuro: alert & oriented x 3, cranial nerves grossly  intact. moves all 4 extremities w/o difficulty. Affect pleasant.   ASSESSMENT & PLAN:  1. 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 Chronic diastolic HF 3. Palpitations with atrial tach/flutter on montior     --CHADSVASC = 2 4. Gout, 5. HTN 6. Tobacco use  cMRI is very suggestive of infiltrative process but an extensive, multidisciplinary work-up at [facility] including endomyocardial biopsy which has not confirmed infiltrative process. I reviewed his echo and his event monitor. Both his LV and RV EF seem down from previous with evidence of infiltrative process. He has sever biatrial enlargement suggestive of restrictive physiology. Monitor shows runs of AFL/ atrial tach. Will start Eliquis 5 bid.  I think he is clearly getting worse both by symptoms and on echo. Now with NYHA IIIB symptoms. I have urged him to follow back up in the [facility] transplant clinic for further testing including possible repeat endomyocardial biopsy. Given AFL/atach will start amiodarone 200 bid. Will see him back in near future to start ACE or ARB as BP tolerates. Will chek labs today. He understands he needs to stop smoking to be a transplant candidate. He is working on this.   RTC in 4 weeks.  Total time spent 40 minutes. Over half that time spent discussing above.     Charles Copado,MD 10:46 AM

## 2014-12-06 NOTE — Patient Instructions (Signed)
Start Amiodarone 200 mg Twice daily   Start Eliquis 5 mg Twice daily   Labs today  Your physician recommends that you schedule a follow-up appointment in: 4 weeks

## 2015-01-03 ENCOUNTER — Inpatient Hospital Stay (HOSPITAL_COMMUNITY): Admission: RE | Admit: 2015-01-03 | Payer: Medicaid Other | Source: Ambulatory Visit

## 2015-02-08 ENCOUNTER — Other Ambulatory Visit (HOSPITAL_COMMUNITY): Payer: Self-pay | Admitting: Cardiology

## 2015-02-08 DIAGNOSIS — I5032 Chronic diastolic (congestive) heart failure: Secondary | ICD-10-CM

## 2015-02-08 MED ORDER — CARVEDILOL 6.25 MG PO TABS: 6.2500 mg | ORAL_TABLET | Freq: Two times a day (BID) | ORAL | Status: DC

## 2015-02-09 ENCOUNTER — Encounter (HOSPITAL_COMMUNITY): Payer: Self-pay | Admitting: Vascular Surgery

## 2015-02-09 ENCOUNTER — Telehealth (HOSPITAL_COMMUNITY): Payer: Self-pay | Admitting: Vascular Surgery

## 2015-02-09 NOTE — Telephone Encounter (Signed)
Not a working #..kas will send letter to address on file

## 2015-03-04 ENCOUNTER — Telehealth (HOSPITAL_COMMUNITY): Payer: Self-pay | Admitting: Cardiology

## 2015-03-04 NOTE — Telephone Encounter (Signed)
Pt called with question regarding Eliquis Pt states he is scheduled for hernia surgery on 03/08/15  Pt would like to know when he should hold eliquis  Per vo Dr.McLean Pt should hold eliqis starting 03/05/15 make sure to keep appt on 03/07/15 for complete cardiac clearance  Pt aware

## 2015-03-07 ENCOUNTER — Encounter (HOSPITAL_COMMUNITY): Payer: Self-pay

## 2015-03-07 ENCOUNTER — Ambulatory Visit (HOSPITAL_COMMUNITY)
Admission: RE | Admit: 2015-03-07 | Discharge: 2015-03-07 | Disposition: A | Discharge status: Home / Self Care | Payer: Medicaid Other | Source: Ambulatory Visit | Attending: Internal Medicine | Admitting: Internal Medicine

## 2015-03-07 VITALS — BP 118/80 | HR 73 | Wt 162.8 lb

## 2015-03-07 DIAGNOSIS — I425 Other restrictive cardiomyopathy: Secondary | ICD-10-CM | POA: Insufficient documentation

## 2015-03-07 DIAGNOSIS — R002 Palpitations: Secondary | ICD-10-CM

## 2015-03-07 DIAGNOSIS — Z79899 Other long term (current) drug therapy: Secondary | ICD-10-CM | POA: Insufficient documentation

## 2015-03-07 DIAGNOSIS — I10 Essential (primary) hypertension: Secondary | ICD-10-CM | POA: Insufficient documentation

## 2015-03-07 DIAGNOSIS — I4892 Unspecified atrial flutter: Secondary | ICD-10-CM | POA: Diagnosis not present

## 2015-03-07 DIAGNOSIS — I429 Cardiomyopathy, unspecified: Secondary | ICD-10-CM

## 2015-03-07 DIAGNOSIS — K439 Ventral hernia without obstruction or gangrene: Secondary | ICD-10-CM | POA: Diagnosis not present

## 2015-03-07 DIAGNOSIS — F17210 Nicotine dependence, cigarettes, uncomplicated: Secondary | ICD-10-CM | POA: Diagnosis not present

## 2015-03-07 DIAGNOSIS — E8770 Fluid overload, unspecified: Secondary | ICD-10-CM | POA: Diagnosis not present

## 2015-03-07 DIAGNOSIS — Z7901 Long term (current) use of anticoagulants: Secondary | ICD-10-CM | POA: Insufficient documentation

## 2015-03-07 DIAGNOSIS — I428 Other cardiomyopathies: Secondary | ICD-10-CM

## 2015-03-07 LAB — HEPATIC FUNCTION PANEL
ALT: 26 U/L (ref 17–63)
AST: 52 U/L — ABNORMAL HIGH (ref 15–41)
Albumin: 3.9 g/dL (ref 3.5–5.0)
Alkaline Phosphatase: 252 U/L — ABNORMAL HIGH (ref 38–126)
Bilirubin, Direct: 1 mg/dL — ABNORMAL HIGH (ref 0.1–0.5)
Indirect Bilirubin: 1.3 mg/dL — ABNORMAL HIGH (ref 0.3–0.9)
Total Bilirubin: 2.3 mg/dL — ABNORMAL HIGH (ref 0.3–1.2)
Total Protein: 8.1 g/dL (ref 6.5–8.1)

## 2015-03-07 LAB — BASIC METABOLIC PANEL
Anion gap: 10 (ref 5–15)
BUN: 10 mg/dL (ref 6–20)
CO2: 23 mmol/L (ref 22–32)
Calcium: 9.1 mg/dL (ref 8.9–10.3)
Chloride: 99 mmol/L — ABNORMAL LOW (ref 101–111)
Creatinine, Ser: 0.93 mg/dL (ref 0.61–1.24)
GFR calc Af Amer: >60 mL/min (ref >60)
GFR calc non Af Amer: >60 mL/min (ref >60)
Glucose, Bld: 85 mg/dL (ref 70–99)
Potassium: 3.7 mmol/L (ref 3.5–5.1)
Sodium: 132 mmol/L — ABNORMAL LOW (ref 135–145)

## 2015-03-07 LAB — CBC
HCT: 42.8 % (ref 39.0–52.0)
Hemoglobin: 14.2 g/dL (ref 13.0–17.0)
MCH: 30.4 pg (ref 26.0–34.0)
MCHC: 33.2 g/dL (ref 30.0–36.0)
MCV: 91.6 fL (ref 78.0–100.0)
Platelets: 176 10*3/uL (ref 150–400)
RBC: 4.67 MIL/uL (ref 4.22–5.81)
RDW: 15.4 % (ref 11.5–15.5)
WBC: 3.6 10*3/uL — ABNORMAL LOW (ref 4.0–10.5)

## 2015-03-07 LAB — BRAIN NATRIURETIC PEPTIDE: B Natriuretic Peptide: 2282.2 pg/mL — ABNORMAL HIGH (ref 0.0–100.0)

## 2015-03-07 LAB — TSH: TSH: 8.357 u[IU]/mL — ABNORMAL HIGH (ref 0.350–4.500)

## 2015-03-07 MED ORDER — POTASSIUM CHLORIDE ER 10 MEQ PO TBCR: 20.0000 meq | EXTENDED_RELEASE_TABLET | Freq: Every day | ORAL | Status: DC

## 2015-03-07 MED ORDER — FUROSEMIDE 40 MG PO TABS: 40.0000 mg | ORAL_TABLET | Freq: Every day | ORAL | Status: DC

## 2015-03-07 MED ORDER — AMIODARONE HCL 200 MG PO TABS: 200.0000 mg | ORAL_TABLET | Freq: Every day | ORAL | Status: DC

## 2015-03-07 NOTE — Progress Notes (Signed)
Patient ID: Charles Hawkins, male   DOB: Mar 11, 1971, 44 y.o.   MRN: [id number]  ADVANCED HF CLINIC CONSULT NOTE  HPI:  Charles Hawkins is a 44 y/o male with h/o HTN, h/o bilateral club feet s/p surgical repair, gout, arthritis and restrictive (?infiltrative) CM referred by [facility] at [facility] for further evaluation. I first saw him in 4/15.   Son had heart transplant at 62 age 37 months old due to reported infiltrative CM (exact diagnosis not reported in records I have). Apparently after that, Charles [facility] underwent cardiac workup and cMRI suggested infiltrative CM with normal EF. Has had exhaustive w/u since that time under the guidance of [facility] at [facility] without clear diagnosis. 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 Wife got job in [employer] with [facility] and he has now relocated to the [facility]. He denies FHx of HF except for his son  Say [facility] in hematology and [facility] in Rheumatology and he was apparently told he did not have a systemic problem.   Studies:  - cMRI - LVEF 63% moderate concentric LVH 1.6 cm. RV normal. Biatrial enlargement, Diffuse infiltrative process involving both ventricles - EM biopsy 11/14 - negative - Fat pad biopsy - neagtive - Serologies all normal - Had ETT in 9/14 (no CPX). 7.6 METs HR 76-156 - Event monitor 5/15; SR with PVCs - Repeat cMRI in 5/15      1. Normal LV size and systolic function, EF 34%. Mild LV     hypertrophy. Normal RV size and systolic function.    2. Moderate to severe biatrial enlargement.    3. Evidence for diffuse infiltrative process by delayed enhancement     imaging, possible cardiac amyloidosis. - Had CPX 4/15 which showed significant cardiac limitation  Resting HR: 75 Peak HR: 130 (72% age predicted max HR) BP rest: 102/58 BP peak: 128/62 Peak VO2: 13.9 (38.8% predicted peak VO2) VE/VCO2 slope: 41.4 OUES: 1.09 Peak RER: 1.13 Ventilatory Threshold: 10.2 (28.5% predicted peak VO2) Peak RR 32 Peak Ventilation: 46.3 VE/MVV:  32.2% PETCO2 at peak: 26 O2pulse: 8 (57% predicted O2pulse) - Echo (2/16) with EF 45-50%, thick LV and RV with starry night pattern, moderately hypokinetic RV, severe biatrial enlargement.   Saw [facility] at [facility] on 06/04/14. They suggested stopping smoking and starting process for [facility]. He has not followed up at [facility] yet.   Event monitor was done, showing multiple runs of atrial tach/flutter 150-160s. He was started on amiodarone and apixaban.  He returns for followup.  Gradual worsening of symptoms.  He is short of breath after walking 1/2 block.  He has orthopnea.  He has early satiety and abdominal discomfort/bloating.  No chest pain.  He was started on spironolactone after last appointment but would become nauseated and vomit shortly after taking the medication.  This resolved when it was stopped. He continues to smoke 2 cigs/day.   He has been evaluated at [facility] for ventral hernia repair.  He has a small, non-incarcerated and nontender ventral hernia.  He has no symptoms clearly referable to this.   Labs (2/16): K 4.2, creatinine 0.89, AST 56, ALT 28, tbili 2.1, HCT 41.1   Current Outpatient Prescriptions on File Prior to Encounter  Medication Sig Dispense Refill  . apixaban (ELIQUIS) 5 MG TABS tablet Take 1 tablet (5 mg total) by mouth 2 (two) times daily. 60 tablet 6  . carvedilol (COREG) 6.25 MG tablet Take 1 tablet (6.25 mg total) by mouth  2 (two) times daily. 60 tablet 3  . Multiple Vitamin (MULTIVITAMIN) tablet Take 1 tablet by mouth daily.    . pregabalin (LYRICA) 75 MG capsule Take 75 mg by mouth every other day.     Marland Kitchen ALDACTONE 25 MG tablet Take 1 tablet (25 mg total) by mouth daily. (Patient not taking: Reported on 03/07/2015) 30 tablet 3   No current facility-administered medications on file prior to encounter.    PHYSICAL EXAM: Filed Vitals:   03/07/15 1117  BP: 118/80  Pulse: 73  Weight: 162 lb 12.8 oz (73.846 kg)  SpO2: 98%    General:  Well  appearing. No respiratory difficulty.  HEENT: normal Neck: supple. JVP 10-12 cm. Carotids 2+ bilat; no bruits. No lymphadenopathy or thryomegaly appreciated. Cor: PMI nondisplaced. Regular. + s4. No s3 Lungs: clear Abdomen: soft, nontender, nondistended. No hepatosplenomegaly. Small non-tender and non-incarcerated ventral hernia. Good bowel sounds. Extremities: braces on LE. no cyanosis, clubbing, rash. 1+ left ankle edema. Neuro: alert & oriented x 3, cranial nerves grossly intact. moves all 4 extremities w/o difficulty. Affect pleasant.   ASSESSMENT & PLAN:  1. Restrictive cardiomyopathy: Cardiac MRI suggestive of infiltration but an extensive, multidisciplinary [facility] at [facility] including endomyocardial biopsy has not confirmed infiltrative process. On last echo in 2/16, both his LV and RV EF seem down from previous with evidence of infiltrative process. He has sever biatrial enlargement suggestive of restrictive physiology.  He had severe cardiac limitation on CPX.  He is getting worse gradually, NYHA class III symptoms.  On exam today, he is volume overloaded.   - I will refer back to [facility] transplant clinic for further evaluation and possible repeat endomyocardial biopsy.  - Needs to quit smoking completely to be a transplant candidate.  - Continue Coreg 6.25 mg bid.  He was unable to tolerate spironolactone.  - Start Lasix 40 mg daily with KCl 20 daily. Check BMET/BNP today and repeat BMET in 2 wks.  - Next appointment, will add ACEI.   2. Palpitations with atrial tach/flutter on monitor: He is now on amiodarone and Eliquis (CHADSVASC = 2 => HTN, CHF).  He is in NSR today and says palpitations improved on amiodarone.  - Decrease amiodarone to 200 mg daily.  LFTs were elevated in 2/16.  Need to recheck LFTs today along with TSH.  He will need regular eye exams with amiodarone use.   - Continue Eliquis, will need to check CBC today.  3. Ventral hernia: Small, non-tender, and non-incarcerated.  He is scheduled for surgery this week at [facility].  I think that his symptoms of abdominal fullness are likely due to CHF/volume overload.  I think he would be high risk for surgery and should avoid surgery for something that is not significantly symptomatic or life-threatening. I advised him against ventral hernia surgery at this time if the hernia is not symptomatic (and it does not seem to be symptomatic).  4. Smoking: I advised him strongly to quit smoking.   Fantasy Donald,MD 03/07/2015

## 2015-03-07 NOTE — Patient Instructions (Signed)
START Lasix 40 mg daily START Potassium 20 meQ daily DECREASE Amiodarone to 200 mg daily Labs today  You have NOT been cleared for surgery, we will re evaulate you in 2 weeks  You have been referred back to [facility] transplant clinic, once they have reviewed your records they will be in contact to arrange a appointment.  Your physician recommends that you schedule a follow-up appointment in: 2 weeks with labs (BMET)  Do the following things EVERYDAY: 1) Weigh yourself in the morning before breakfast. Write it down and keep it in a log. 2) Take your medicines as prescribed 3) Eat low salt foods-Limit salt (sodium) to 2000 mg per day.  4) Stay as active as you can everyday 5) Limit all fluids for the day to less than 2 liters 6)

## 2015-03-22 ENCOUNTER — Encounter (HOSPITAL_COMMUNITY): Payer: Self-pay

## 2015-03-22 ENCOUNTER — Ambulatory Visit (HOSPITAL_COMMUNITY)
Admission: RE | Admit: 2015-03-22 | Discharge: 2015-03-22 | Disposition: A | Discharge status: Home / Self Care | Payer: Medicaid Other | Source: Ambulatory Visit | Attending: Cardiology | Admitting: Cardiology

## 2015-03-22 VITALS — BP 120/68 | HR 74 | Wt 149.5 lb

## 2015-03-22 DIAGNOSIS — I4892 Unspecified atrial flutter: Secondary | ICD-10-CM | POA: Diagnosis not present

## 2015-03-22 DIAGNOSIS — I10 Essential (primary) hypertension: Secondary | ICD-10-CM | POA: Insufficient documentation

## 2015-03-22 DIAGNOSIS — M109 Gout, unspecified: Secondary | ICD-10-CM | POA: Diagnosis not present

## 2015-03-22 DIAGNOSIS — K439 Ventral hernia without obstruction or gangrene: Secondary | ICD-10-CM | POA: Insufficient documentation

## 2015-03-22 DIAGNOSIS — I429 Cardiomyopathy, unspecified: Secondary | ICD-10-CM | POA: Diagnosis not present

## 2015-03-22 DIAGNOSIS — R002 Palpitations: Secondary | ICD-10-CM | POA: Insufficient documentation

## 2015-03-22 DIAGNOSIS — Z79899 Other long term (current) drug therapy: Secondary | ICD-10-CM | POA: Diagnosis not present

## 2015-03-22 DIAGNOSIS — I5032 Chronic diastolic (congestive) heart failure: Secondary | ICD-10-CM | POA: Diagnosis not present

## 2015-03-22 DIAGNOSIS — I428 Other cardiomyopathies: Secondary | ICD-10-CM

## 2015-03-22 DIAGNOSIS — Z7901 Long term (current) use of anticoagulants: Secondary | ICD-10-CM | POA: Insufficient documentation

## 2015-03-22 DIAGNOSIS — I425 Other restrictive cardiomyopathy: Secondary | ICD-10-CM | POA: Insufficient documentation

## 2015-03-22 DIAGNOSIS — F17210 Nicotine dependence, cigarettes, uncomplicated: Secondary | ICD-10-CM | POA: Insufficient documentation

## 2015-03-22 LAB — BASIC METABOLIC PANEL
Anion gap: 9 (ref 5–15)
BUN: 12 mg/dL (ref 6–20)
CO2: 24 mmol/L (ref 22–32)
Calcium: 9.6 mg/dL (ref 8.9–10.3)
Chloride: 98 mmol/L — ABNORMAL LOW (ref 101–111)
Creatinine, Ser: 0.91 mg/dL (ref 0.61–1.24)
GFR calc Af Amer: >60 mL/min (ref >60)
GFR calc non Af Amer: >60 mL/min (ref >60)
Glucose, Bld: 95 mg/dL (ref 65–99)
Potassium: 4.5 mmol/L (ref 3.5–5.1)
Sodium: 131 mmol/L — ABNORMAL LOW (ref 135–145)

## 2015-03-22 LAB — HEPATIC FUNCTION PANEL
ALT: 41 U/L (ref 17–63)
AST: 62 U/L — ABNORMAL HIGH (ref 15–41)
Albumin: 4.3 g/dL (ref 3.5–5.0)
Alkaline Phosphatase: 267 U/L — ABNORMAL HIGH (ref 38–126)
Bilirubin, Direct: 0.7 mg/dL — ABNORMAL HIGH (ref 0.1–0.5)
Indirect Bilirubin: 1 mg/dL — ABNORMAL HIGH (ref 0.3–0.9)
Total Bilirubin: 1.7 mg/dL — ABNORMAL HIGH (ref 0.3–1.2)
Total Protein: 9.6 g/dL — ABNORMAL HIGH (ref 6.5–8.1)

## 2015-03-22 LAB — BRAIN NATRIURETIC PEPTIDE: B Natriuretic Peptide: 1242.5 pg/mL — ABNORMAL HIGH (ref 0.0–100.0)

## 2015-03-22 MED ORDER — AMIODARONE HCL 100 MG PO TABS: 100.0000 mg | ORAL_TABLET | Freq: Every day | ORAL | Status: DC

## 2015-03-22 MED ORDER — LISINOPRIL 5 MG PO TABS: 5.0000 mg | ORAL_TABLET | Freq: Every day | ORAL | Status: DC

## 2015-03-22 MED ORDER — NICOTINE 7 MG/24HR TD PT24: 7.0000 mg | MEDICATED_PATCH | Freq: Every day | TRANSDERMAL | Status: DC

## 2015-03-22 MED ORDER — FUROSEMIDE 40 MG PO TABS: ORAL_TABLET | ORAL | Status: DC

## 2015-03-22 NOTE — Patient Instructions (Signed)
DECREASE Amiodarone to 100 mg once daily. Can cut current 200 mg tablets in half.  New Rx will be 100 mg tablets.  INCREASE Lasix to 40 mg (1 tablet) in am and 20 mg (1/2 tablet) in pm.  START Lisinopril 5mg  (1 tablet) once daily. May have a dry cough with this medication.  Let us know if this does not subside after 7-10 days.  START Nicoderm patch once every 24 hrs. Put on clean, dry skin.  Avoid lotions/creams.  Change sites each time.  Return in 2 weeks for lab work.  Follow up 1 month.  Do the following things EVERYDAY: 1) Weigh yourself in the morning before breakfast. Write it down and keep it in a log. 2) Take your medicines as prescribed 3) Eat low salt foods-Limit salt (sodium) to 2000 mg per day.  4) Stay as active as you can everyday 5) Limit all fluids for the day to less than 2 liters

## 2015-03-22 NOTE — Progress Notes (Signed)
Patient ID: Charles [facility], male   DOB: 23-Oct-1971, 44 y.o.   MRN: [id number]  ADVANCED HF CLINIC CONSULT NOTE  HPI:  Mr. Zeiss is a 44 y/o male with h/o HTN, h/o bilateral club feet s/p surgical repair, gout, arthritis and restrictive (?infiltrative) CM referred by Dr. Gershon Mussel Povsic at [facility] for further evaluation. I first saw him in 42/15.   Son had heart transplant at 74 age 67 months old due to reported infiltrative CM (exact diagnosis not reported in records I have). Apparently after that, Mr. Haggart underwent cardiac workup and cMRI suggested infiltrative CM with normal EF. Has had exhaustive w/u since that time under the guidance of Dr. Loyal Buba at [facility] without clear diagnosis. 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 Wife got job in [employer] with [employer] and he has now relocated to the [place]. He denies FHx of HF except for his son  Saw Dr. Amalia Hailey in hematology and [name] in Rheumatology and he was apparently told he did not have a systemic problem at that time.   Studies:  - cMRI - LVEF 63% moderate concentric LVH 1.6 cm. RV normal. Biatrial enlargement, Diffuse infiltrative process involving both ventricles - EM biopsy 11/14 - negative - Fat pad biopsy - neagtive - Serologies all normal - Had ETT in 9/14 (no CPX). 7.6 METs HR 76-156 - Event monitor 5/15; SR with PVCs - Repeat cMRI in 5/15      1. Normal LV size and systolic function, EF 32%. Mild LV     hypertrophy. Normal RV size and systolic function.    2. Moderate to severe biatrial enlargement.    3. Evidence for diffuse infiltrative process by delayed enhancement     imaging, possible cardiac amyloidosis. - Had CPX 4/15 which showed significant cardiac limitation  Resting HR: 75 Peak HR: 130 (72% age predicted max HR) BP rest: 102/58 BP peak: 128/62 Peak VO2: 13.9 (38.8% predicted peak VO2) VE/VCO2 slope: 41.4 OUES: 1.09 Peak RER: 1.13 Ventilatory Threshold: 10.2 (28.5% predicted peak VO2) Peak RR 32 Peak Ventilation:  46.3 VE/MVV: 32.2% PETCO2 at peak: 26 O2pulse: 8 (57% predicted O2pulse) - Echo (2/16) with EF 45-50%, thick LV and RV with starry night pattern, moderately hypokinetic RV, severe biatrial enlargement.   Saw Dr. Posey Pronto at [facility] on 06/04/14. They suggested stopping smoking and starting process for OHTx work-up. He has not followed up at [facility] yet.   Event monitor was done, showing multiple runs of atrial tach/flutter 150-160s. He was started on amiodarone and apixaban.  He returns for followup.  Did not have the ventral hernia repair.  I started him on Lasix at last appointment for volume overload.  Weight is down 13 lbs and his breathing is much better.  He is not short of breath walking on flat ground or walking up a flight of stairs.  Dyspnea with heavier exertion.  No chest pain, no palpitations, no lightheadedness/syncope.  No BRBPR or melena. He continues to smoke 2-3 cigs/day, and he drinks two 24 ounce beer cans usually twice a week on the weekend typically. AST and bilirubin have been high on LFTs.   Labs (2/16): K 4.2, creatinine 0.89, AST 56, ALT 28, tbili 2.1, HCT 41.1 Labs (5/16): K 3.7, creatinine 0.93, TSH 8.357, BNP 2282, AST 52, ALT 26, tbili 2.3, HCT 42.8   Current Outpatient Prescriptions on File Prior to Encounter  Medication Sig Dispense Refill  . apixaban (ELIQUIS) 5 MG TABS tablet Take 1 tablet (5 mg total)  by mouth 2 (two) times daily. 60 tablet 6  . carvedilol (COREG) 6.25 MG tablet Take 1 tablet (6.25 mg total) by mouth 2 (two) times daily. 60 tablet 3  . Multiple Vitamin (MULTIVITAMIN) tablet Take 1 tablet by mouth daily.    . potassium chloride (K-DUR) 10 MEQ tablet Take 2 tablets (20 mEq total) by mouth daily. 60 tablet 3  . pregabalin (LYRICA) 75 MG capsule Take 75 mg by mouth every other day.      No current facility-administered medications on file prior to encounter.    PHYSICAL EXAM: Filed Vitals:   03/22/15 0940  BP: 120/68  Pulse: 74   Weight: 149 lb 8 oz (67.813 kg)  SpO2: 98%    General:  Well appearing. No respiratory difficulty.  HEENT: normal Neck: supple. JVP 8-9 cm cm. Carotids 2+ bilat; no bruits. No lymphadenopathy or thryomegaly appreciated. Cor: PMI nondisplaced. Regular. +s4. No s3 Lungs: clear Abdomen: soft, nontender, nondistended. No hepatosplenomegaly. Small non-tender and non-incarcerated ventral hernia. Good bowel sounds. Extremities: braces on LE. no cyanosis, clubbing, rash.  No edema. Neuro: alert & oriented x 3, cranial nerves grossly intact. moves all 4 extremities w/o difficulty. Affect pleasant.   ASSESSMENT & PLAN:  1. Restrictive cardiomyopathy: Cardiac MRI suggestive of infiltration but an extensive, multidisciplinary work-up at [facility] including endomyocardial biopsy has not confirmed infiltrative process. On last echo in 2/16, both his LV and RV EF seem down from previous with evidence of infiltrative process. He has severe biatrial enlargement suggestive of restrictive physiology.  He had severe cardiac limitation on CPX.  He has gotten worse gradually, NYHA class III symptoms at last appointment.  I started him on Lasix, and symptoms have improved => NYHA class II.  On exam today, he is still volume overloaded but improved from last visit.  Weight down 13 lbs.   - He has an appointment Thursday at the [facility] transplant clinic for further evaluation and possible repeat endomyocardial biopsy.  - Needs to quit smoking completely to be a transplant candidate => he wants to try nicotine patches, I will prescribe.  Given cardiomyopathy, I asked him to cut back on ETOH intake.  - Continue Coreg 6.25 mg bid.  He was unable to tolerate spironolactone.  - Increase Lasix to 40 qam, 20 qpm.  He will need BMET in 2 wks.  - Add lisinopril 5 mg daily.   2. Palpitations with atrial tach/flutter on monitor: He is now on amiodarone and Eliquis (CHADSVASC = 2 => HTN, CHF).  He is in NSR today and says palpitations  are improved on amiodarone.  LFTs are elevated, this may be due to hepatic congestion from CHF but amiodarone liver toxicity is a concern.  TSH is also high.  - Decrease amiodarone to 100 mg daily.  Repeat LFTs today (see if they improved with diuresis => would suggest congestive hepatopathy), check TSH/free T4/free T3.  He will need regular eye exams with amiodarone use.   - Continue Eliquis.  3. Ventral hernia: Small, non-tender, and non-incarcerated. Given lack of symptoms, no surgery.   4. Smoking: I advised him strongly to quit smoking. He wants to try nicotine patches. I will provide prescription.   Supreme Rybarczyk,MD 03/22/2015

## 2015-04-06 ENCOUNTER — Other Ambulatory Visit (HOSPITAL_COMMUNITY): Payer: Medicaid Other

## 2015-04-13 ENCOUNTER — Telehealth (HOSPITAL_COMMUNITY): Payer: Self-pay | Admitting: Cardiology

## 2015-04-13 ENCOUNTER — Ambulatory Visit (HOSPITAL_COMMUNITY)
Admission: RE | Admit: 2015-04-13 | Discharge: 2015-04-13 | Disposition: A | Discharge status: Home / Self Care | Payer: Medicaid Other | Source: Ambulatory Visit | Attending: Cardiology | Admitting: Cardiology

## 2015-04-13 DIAGNOSIS — I5022 Chronic systolic (congestive) heart failure: Secondary | ICD-10-CM | POA: Diagnosis not present

## 2015-04-13 DIAGNOSIS — I5032 Chronic diastolic (congestive) heart failure: Secondary | ICD-10-CM | POA: Diagnosis not present

## 2015-04-13 LAB — BASIC METABOLIC PANEL
Anion gap: 11 (ref 5–15)
BUN: 15 mg/dL (ref 6–20)
CO2: 21 mmol/L — ABNORMAL LOW (ref 22–32)
Calcium: 9.8 mg/dL (ref 8.9–10.3)
Chloride: 103 mmol/L (ref 101–111)
Creatinine, Ser: 1.05 mg/dL (ref 0.61–1.24)
GFR calc Af Amer: >60 mL/min (ref >60)
GFR calc non Af Amer: >60 mL/min (ref >60)
Glucose, Bld: 81 mg/dL (ref 65–99)
Potassium: 4 mmol/L (ref 3.5–5.1)
Sodium: 135 mmol/L (ref 135–145)

## 2015-04-13 NOTE — Telephone Encounter (Signed)
During patients lab appointment, patient requested a letter of support for his disability claim States he is in his 4th appeal and was told by disability coordinator that a letter of support could possibly help his claim  Thanks

## 2015-04-21 ENCOUNTER — Encounter (HOSPITAL_COMMUNITY): Payer: Self-pay

## 2015-04-21 ENCOUNTER — Ambulatory Visit (HOSPITAL_COMMUNITY)
Admission: RE | Admit: 2015-04-21 | Discharge: 2015-04-21 | Disposition: A | Discharge status: Home / Self Care | Payer: Medicare Other | Source: Ambulatory Visit | Attending: Cardiology | Admitting: Cardiology

## 2015-04-21 VITALS — BP 116/62 | HR 66 | Wt 147.2 lb

## 2015-04-21 DIAGNOSIS — R002 Palpitations: Secondary | ICD-10-CM

## 2015-04-21 DIAGNOSIS — I428 Other cardiomyopathies: Secondary | ICD-10-CM

## 2015-04-21 DIAGNOSIS — I429 Cardiomyopathy, unspecified: Secondary | ICD-10-CM

## 2015-04-21 DIAGNOSIS — F17210 Nicotine dependence, cigarettes, uncomplicated: Secondary | ICD-10-CM | POA: Insufficient documentation

## 2015-04-21 DIAGNOSIS — Z7901 Long term (current) use of anticoagulants: Secondary | ICD-10-CM | POA: Insufficient documentation

## 2015-04-21 DIAGNOSIS — I5032 Chronic diastolic (congestive) heart failure: Secondary | ICD-10-CM

## 2015-04-21 DIAGNOSIS — I425 Other restrictive cardiomyopathy: Secondary | ICD-10-CM | POA: Insufficient documentation

## 2015-04-21 DIAGNOSIS — Z79899 Other long term (current) drug therapy: Secondary | ICD-10-CM | POA: Insufficient documentation

## 2015-04-21 DIAGNOSIS — K439 Ventral hernia without obstruction or gangrene: Secondary | ICD-10-CM | POA: Diagnosis not present

## 2015-04-21 LAB — COMPREHENSIVE METABOLIC PANEL
ALT: 35 U/L (ref 17–63)
AST: 55 U/L — ABNORMAL HIGH (ref 15–41)
Albumin: 4.5 g/dL (ref 3.5–5.0)
Alkaline Phosphatase: 194 U/L — ABNORMAL HIGH (ref 38–126)
Anion gap: 9 (ref 5–15)
BUN: 10 mg/dL (ref 6–20)
CO2: 26 mmol/L (ref 22–32)
Calcium: 9.8 mg/dL (ref 8.9–10.3)
Chloride: 100 mmol/L — ABNORMAL LOW (ref 101–111)
Creatinine, Ser: 1.03 mg/dL (ref 0.61–1.24)
GFR calc Af Amer: >60 mL/min (ref >60)
GFR calc non Af Amer: >60 mL/min (ref >60)
Glucose, Bld: 88 mg/dL (ref 65–99)
Potassium: 5.3 mmol/L — ABNORMAL HIGH (ref 3.5–5.1)
Sodium: 135 mmol/L (ref 135–145)
Total Bilirubin: 1.9 mg/dL — ABNORMAL HIGH (ref 0.3–1.2)
Total Protein: 9.8 g/dL — ABNORMAL HIGH (ref 6.5–8.1)

## 2015-04-21 LAB — T4, FREE: Free T4: 1.2 ng/dL — ABNORMAL HIGH (ref 0.61–1.12)

## 2015-04-21 LAB — TSH: TSH: 4.289 u[IU]/mL (ref 0.350–4.500)

## 2015-04-21 LAB — BRAIN NATRIURETIC PEPTIDE: B Natriuretic Peptide: 1202.5 pg/mL — ABNORMAL HIGH (ref 0.0–100.0)

## 2015-04-21 MED ORDER — BUPROPION HCL ER (SR) 150 MG PO TB12: 150.0000 mg | ORAL_TABLET | Freq: Two times a day (BID) | ORAL | Status: DC

## 2015-04-21 MED ORDER — SACUBITRIL-VALSARTAN 24-26 MG PO TABS: 1.0000 | ORAL_TABLET | Freq: Two times a day (BID) | ORAL | Status: DC

## 2015-04-21 NOTE — Patient Instructions (Signed)
LABS today (tsh cmet bnp freet3 freet4)  START Wellbutrin 150mg  (1 tablet) daily for 3 days then TAKE 1 tablet twice a day.    START Entresto 24-26mg  (1 tablet) on Saturday.  LABS in 10days 9bmet)  FOLLOW UP in 2 months.

## 2015-04-21 NOTE — Progress Notes (Signed)
Patient ID: Charles Hawkins, male   DOB: 1971-09-28, 44 y.o.   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 Gershon Mussel Povsic at [facility] for further evaluation. I first saw him in 4/15.   Son had heart transplant at 41 age 63 months old due to reported infiltrative CM (exact diagnosis not reported in records I have). Apparently after that, Charles Hawkins underwent cardiac workup and cMRI suggested infiltrative CM with normal EF. Has had exhaustive w/u since that time under the guidance of Dr. Loyal Buba at [facility] without clear diagnosis. 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 Wife got job in [employer] with [employer] and [place] now [place]. He denies FHx of HF except for his son  Saw Dr. Amalia Hailey in hematology and [name] in Rheumatology and he was apparently told he did not have a systemic problem at that time.   Studies:  - cMRI - LVEF 63% moderate concentric LVH 1.6 cm. RV normal. Biatrial enlargement, Diffuse infiltrative process involving both ventricles - EM biopsy 11/14 - negative - Fat pad biopsy - neagtive - Serologies all normal - Had ETT in 9/14 (no CPX). 7.6 METs HR 76-156 - Event monitor 5/15; SR with PVCs - Repeat cMRI in 5/15      1. Normal LV size and systolic function, EF 88%. Mild LV     hypertrophy. Normal RV size and systolic function.    2. Moderate to severe biatrial enlargement.    3. Evidence for diffuse infiltrative process by delayed enhancement     imaging, possible cardiac amyloidosis. - Had CPX 4/15 which showed significant cardiac limitation  Resting HR: 75 Peak HR: 130 (72% age predicted max HR) BP rest: 102/58 BP peak: 128/62 Peak VO2: 13.9 (38.8% predicted peak VO2) VE/VCO2 slope: 41.4 OUES: 1.09 Peak RER: 1.13 Ventilatory Threshold: 10.2 (28.5% predicted peak VO2) Peak RR 32 Peak Ventilation:  46.3 VE/MVV: 32.2% PETCO2 at peak: 26 O2pulse: 8 (57% predicted O2pulse) - Echo (2/16) with EF 35-40%, thick LV and RV with starry night pattern, moderately hypokinetic RV, severe biatrial enlargement.   Saw Dr. Posey Pronto at [facility] on 06/04/14. They suggested stopping smoking and starting process for OHTx work-up. Returns to [facility] later this month.   Event monitor was done recently, showing multiple runs of atrial tach/flutter 150-160s. He was started on amiodarone and apixaban.  He returns for followup.  Weight is down 15 lbs overall and his breathing continues to improve.  He is not short of breath walking about 100 yards on flat ground or walking up a flight of stairs. He says he got a little winded pulling the trashcans back from the curb on Monday, but otherwise hasn't had any problems, DOE only with moderate to heavy exertion. Had a little bit of chest pressure a few days ago, but thinks it was anxiety related when disability was denied though he says it did last for about 2 hours. He had some lightheadedness on standing along with this. No palpitations, no syncope.  No BRBPR or melena. He continues to smoke 2-3 cigs/day. Said he tried the nicotine patches but went back to smoking when he was denied disability. Still drinks two 24 ounce beer cans usually twice a week on the weekend typically.   Labs (2/16): K 4.2, creatinine 0.89, AST 56, ALT 28, tbili  2.1, HCT 41.1 Labs (5/16): K 3.7, creatinine 0.93, TSH 8.357, BNP 2282 => 1242, AST 52 => 62, ALT 26 => 41, tbili 2.3, HCT 42.8 Labs (04/13/15): K 4.0, Creatinine 1.05   Current Outpatient Prescriptions on File Prior to Encounter  Medication Sig Dispense Refill  . amiodarone (PACERONE) 100 MG tablet Take 1 tablet (100 mg total) by mouth daily. 30 tablet 6  . apixaban (ELIQUIS) 5 MG TABS tablet Take 1 tablet (5 mg total) by mouth 2 (two) times daily. 60 tablet 6  . carvedilol (COREG) 6.25 MG tablet Take 1 tablet (6.25 mg total) by  mouth 2 (two) times daily. 60 tablet 3  . furosemide (LASIX) 40 MG tablet Take 40 mg (1 tablet) in am and 20 mg (1/2 tablet) in pm 45 tablet 3  . lisinopril (PRINIVIL,ZESTRIL) 5 MG tablet Take 1 tablet (5 mg total) by mouth daily. 90 tablet 3  . nicotine (NICODERM CQ - DOSED IN MG/24 HR) 7 mg/24hr patch Place 1 patch (7 mg total) onto the skin daily. 28 patch 0  . potassium chloride (K-DUR) 10 MEQ tablet Take 2 tablets (20 mEq total) by mouth daily. 60 tablet 3  . pregabalin (LYRICA) 75 MG capsule Take 75 mg by mouth every other day.     . Multiple Vitamin (MULTIVITAMIN) tablet Take 1 tablet by mouth daily.     No current facility-administered medications on file prior to encounter.    PHYSICAL EXAM: Filed Vitals:   04/21/15 0933  BP: 116/62  Pulse: 66  Weight: 147 lb 4 oz (66.792 kg)  SpO2: 99%    General:  Well appearing. No respiratory difficulty.  HEENT: normal Neck: supple. JVP not elevated. Carotids 2+ bilat; no bruits. No lymphadenopathy or thryomegaly appreciated. Cor: PMI nondisplaced. Regular. +s4. No s3 Lungs: clear Abdomen: soft, nontender, nondistended. No hepatosplenomegaly. Small non-tender and non-incarcerated ventral hernia. Good bowel sounds. Extremities: braces on LE. no cyanosis, clubbing, rash.  No edema. Neuro: alert & oriented x 3, cranial nerves grossly intact. moves all 4 extremities w/o difficulty. Affect pleasant.  ASSESSMENT & PLAN:  1. Restrictive cardiomyopathy: Cardiac MRI suggestive of infiltration but an extensive, multidisciplinary work-up at [facility] including endomyocardial biopsy has not confirmed amyloidosis. 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  On last echo in 3/16, both his LV (EF 35-40%) and RV seemed down from previous with evidence of infiltrative process. He has severe biatrial enlargement suggestive of restrictive physiology.  He had severe cardiac limitation on CPX.  I started him on Lasix, and symptoms have improved =>  NYHA class II.  Weight down another 2 lbs. 15 lbs overall.  He is not volume overloaded on exam.  - He has an appointment Thursday at the [facility] transplant clinic for further evaluation.  - Needs to quit smoking completely to be a transplant candidate => nicotine patches failed, I will have him try Wellbutrin.  Given cardiomyopathy, needs to cut back on ETOH intake.  - Continue Coreg 6.25 mg bid.  He was unable to tolerate spironolactone.  - Continue Lasix to 40 qam, 20 qpm.  BMET today.  - Switch to Entresto 24/26 bid after 36 hr lisinopril washout. 2. Palpitations with atrial tach/flutter on monitor: He is now on amiodarone and Eliquis (CHADSVASC = 2 => HTN, CHF).  He is in NSR today and says palpitations are improved on amiodarone.  LFTs were elevated when last checked, this may be due to hepatic congestion from CHF but amiodarone liver toxicity is a concern.  TSH was also high.  - Continue amiodarone at low dose, 100 mg daily.  Will check CMET, TSH, T3/T4 today.  LFTs may be improved with diuresis if elevation is due to congestive hepatopathy.  He will need regular eye exams with amiodarone use.   - Continue Eliquis.  3. Ventral hernia: Small, non-tender, and non-incarcerated. Given lack of symptoms, no surgery.  Says it is still spontaneously reducible 4. Smoking: I advised him strongly to quit smoking.  - Nicotine patches tried, but smoked again 2/2 stress.  - Will try Wellbutrin  Loralie Champagne 04/21/2015

## 2015-04-22 LAB — T3, FREE: T3, Free: 3 pg/mL (ref 2.0–4.4)

## 2015-04-26 NOTE — Progress Notes (Signed)
Spoke with pt and gave lab results.  Pt verbally  Understood medication change.

## 2015-04-27 ENCOUNTER — Ambulatory Visit (HOSPITAL_COMMUNITY)
Admission: RE | Admit: 2015-04-27 | Discharge: 2015-04-27 | Disposition: A | Discharge status: Home / Self Care | Payer: Medicaid Other | Source: Ambulatory Visit | Attending: Cardiology | Admitting: Cardiology

## 2015-04-27 DIAGNOSIS — I5032 Chronic diastolic (congestive) heart failure: Secondary | ICD-10-CM | POA: Diagnosis not present

## 2015-04-27 DIAGNOSIS — I5022 Chronic systolic (congestive) heart failure: Secondary | ICD-10-CM

## 2015-04-27 LAB — BASIC METABOLIC PANEL
Anion gap: 9 (ref 5–15)
BUN: 21 mg/dL — ABNORMAL HIGH (ref 6–20)
CO2: 26 mmol/L (ref 22–32)
Calcium: 9.8 mg/dL (ref 8.9–10.3)
Chloride: 100 mmol/L — ABNORMAL LOW (ref 101–111)
Creatinine, Ser: 1.08 mg/dL (ref 0.61–1.24)
GFR calc Af Amer: >60 mL/min (ref >60)
GFR calc non Af Amer: >60 mL/min (ref >60)
Glucose, Bld: 68 mg/dL (ref 65–99)
Potassium: 3.8 mmol/L (ref 3.5–5.1)
Sodium: 135 mmol/L (ref 135–145)

## 2015-05-02 ENCOUNTER — Other Ambulatory Visit (HOSPITAL_COMMUNITY): Payer: Self-pay | Admitting: Cardiology

## 2015-05-17 ENCOUNTER — Telehealth (HOSPITAL_COMMUNITY): Payer: Self-pay | Admitting: *Deleted

## 2015-05-17 ENCOUNTER — Other Ambulatory Visit (HOSPITAL_COMMUNITY): Payer: Self-pay | Admitting: Internal Medicine

## 2015-05-17 NOTE — Telephone Encounter (Signed)
Completed PA for Entresto 24/26 through [employer], med was approved 05/03/15-04/27/16 PA #[id number], pharmacy aware

## 2015-06-19 ENCOUNTER — Other Ambulatory Visit (HOSPITAL_COMMUNITY): Payer: Self-pay | Admitting: Internal Medicine

## 2015-06-30 ENCOUNTER — Telehealth (HOSPITAL_COMMUNITY): Payer: Self-pay | Admitting: Cardiology

## 2015-06-30 NOTE — Telephone Encounter (Signed)
Patient called to request that someone review his cardiac medication list and see if blurred vision is a side effect Patient states for the past month,  his vision has a halo effect, can only see the boarder of images, "bright sun" at times Denies HA, SOB, change in blood pressure, CBG's normal at last PCP visit.  Just wanted to make sure these symptoms are not side effects before making eye appointment   Advised will review with provider and or HF pharmacist and some one will give him a call

## 2015-07-05 NOTE — Telephone Encounter (Signed)
Medications reviewed in detail with Jacob Moores, Pharm D Amiodarone is the main concern as significant  Adverse reactions include: Corneal deposits (>90%; causes visual disturbance in <10%), visual halos (?10%), visual disturbance (2% to 9%), optic neuritis (1%), photophobia Wellbutrin and Lyrica also have visual disturbance SE, however these symptoms are likely to present themselves shortly after starting    Patient advised he should follow up with Opthalmology ASAP Patient reports he is no longer taking Lyrica and Wellbutrin advised would speak with provider regarding continuing Amiodarone    Please advise

## 2015-07-20 NOTE — Telephone Encounter (Signed)
Agree with pharmacist note. Have him f/u with ophtho asap.  Marajade Lei,MD 11:04 PM

## 2015-08-17 ENCOUNTER — Other Ambulatory Visit (HOSPITAL_COMMUNITY): Payer: Self-pay | Admitting: Internal Medicine

## 2015-08-19 ENCOUNTER — Other Ambulatory Visit (HOSPITAL_COMMUNITY): Payer: Self-pay | Admitting: Cardiology

## 2015-08-22 MED ORDER — FUROSEMIDE 40 MG PO TABS: ORAL_TABLET | ORAL | Status: DC

## 2015-08-22 NOTE — Telephone Encounter (Signed)
Sent in refill for Lasix.  Called patient to let him know.  Patient requested 90 day supply with 3 refills

## 2015-09-23 ENCOUNTER — Other Ambulatory Visit (HOSPITAL_COMMUNITY): Payer: Self-pay | Admitting: Cardiology

## 2016-05-09 ENCOUNTER — Other Ambulatory Visit (HOSPITAL_COMMUNITY): Payer: Self-pay | Admitting: Internal Medicine

## 2016-05-14 ENCOUNTER — Other Ambulatory Visit (HOSPITAL_COMMUNITY): Payer: Self-pay | Admitting: Cardiology

## 2016-07-27 ENCOUNTER — Other Ambulatory Visit: Payer: Self-pay | Admitting: Physician Assistant

## 2016-07-27 DIAGNOSIS — R600 Localized edema: Secondary | ICD-10-CM

## 2016-07-31 ENCOUNTER — Ambulatory Visit
Admission: RE | Admit: 2016-07-31 | Discharge: 2016-07-31 | Disposition: A | Discharge status: Home / Self Care | Payer: Medicare Other | Source: Ambulatory Visit | Attending: Physician Assistant | Admitting: Physician Assistant

## 2016-07-31 DIAGNOSIS — R600 Localized edema: Secondary | ICD-10-CM

## 2016-08-02 ENCOUNTER — Other Ambulatory Visit (HOSPITAL_COMMUNITY): Payer: Self-pay | Admitting: Respiratory Therapy

## 2016-08-02 DIAGNOSIS — R569 Unspecified convulsions: Secondary | ICD-10-CM

## 2016-08-07 ENCOUNTER — Ambulatory Visit (HOSPITAL_COMMUNITY)
Admission: RE | Admit: 2016-08-07 | Discharge: 2016-08-07 | Disposition: A | Discharge status: Home / Self Care | Payer: Medicare Other | Source: Ambulatory Visit | Attending: Neurology | Admitting: Neurology

## 2016-08-07 DIAGNOSIS — Z7901 Long term (current) use of anticoagulants: Secondary | ICD-10-CM | POA: Diagnosis not present

## 2016-08-07 DIAGNOSIS — R42 Dizziness and giddiness: Secondary | ICD-10-CM | POA: Diagnosis not present

## 2016-08-07 DIAGNOSIS — Z79899 Other long term (current) drug therapy: Secondary | ICD-10-CM | POA: Insufficient documentation

## 2016-08-07 DIAGNOSIS — R569 Unspecified convulsions: Secondary | ICD-10-CM | POA: Diagnosis not present

## 2016-08-07 NOTE — Progress Notes (Signed)
EEG completed, results pending. 

## 2016-08-07 NOTE — Procedures (Signed)
ELECTROENCEPHALOGRAM REPORT  Date of Study: 08/07/2016  Patient's Name: Charles Hawkins MRN: [id number] Date of Birth: 12/02/1970  Referring Provider: Renaldo Fiddler   Clinical History: This is a 45 year old man with dizziness and episodes of shaking.  Medications: pregabalin (LYRICA) 75 MG capsule Take 75 mg by mouth every other day.  amiodarone (PACERONE) 100 MG tablet Take 1 tablet (100 mg total) by mouth daily. amiodarone (PACERONE) 200 MG tablet TAKE 1 TABLET (200 MG TOTAL) BY MOUTH 2 (TWO) TIMES DAILY. buPROPion (WELLBUTRIN SR) 150 MG 12 hr tablet Take 1 tablet (150 mg total) by mouth 2 (two) times daily. carvedilol (COREG) 6.25 MG tablet TAKE 1 TABLET BY MOUTH TWICE A DAY ELIQUIS 5 MG TABS tablet TAKE 1 TABLET (5 MG TOTAL) BY MOUTH 2 (TWO) TIMES DAILY. furosemide (LASIX) 40 MG tablet Take 40 mg (1 tablet) in the morningTake 20 mg ( 1/2 tablet) in the evening Multiple Vitamin (MULTIVITAMIN) tablet Take 1 tablet by mouth daily. nicotine (NICODERM CQ - DOSED IN MG/24 HR) 7 mg/24hr patch PLACE 1 PATCH (7 MG TOTAL) ONTO THE SKIN DAILY. potassium chloride (MICRO-K) 10 MEQ CR capsule TAKE 2 TABLETS (20 MEQ TOTAL) BY MOUTH DAILY. sacubitril-valsartan (ENTRESTO) 24-26 MG   Technical Summary: A multichannel digital EEG recording measured by the international 10-20 system with electrodes applied with paste and impedances below 5000 ohms performed in our laboratory with EKG monitoring in an awake and drowsy patient.  Hyperventilation was not performed. Photic stimulation was performed.  The digital EEG was referentially recorded, reformatted, and digitally filtered in a variety of bipolar and referential montages for optimal display.    Description: The patient is awake and drowsy during the recording.  During maximal wakefulness, there is a symmetric, medium voltage 8.5 Hz posterior dominant rhythm that attenuates with eye opening.  The record is symmetric.  During drowsiness, there is  an increase in theta slowing of the background.  Deeper stages of sleep were not seen.  Photic stimulation did not elicit any abnormalities.  There were no epileptiform discharges or electrographic seizures seen.    EKG lead was unremarkable.  Impression: This awake and drowsy EEG is normal.    Clinical Correlation: A normal EEG does not exclude a clinical diagnosis of epilepsy. Clinical correlation is advised.   Ellouise Newer, M.D.

## 2016-08-28 ENCOUNTER — Other Ambulatory Visit (HOSPITAL_BASED_OUTPATIENT_CLINIC_OR_DEPARTMENT_OTHER): Payer: Self-pay

## 2016-08-28 DIAGNOSIS — R5383 Other fatigue: Secondary | ICD-10-CM

## 2016-09-23 ENCOUNTER — Ambulatory Visit (HOSPITAL_BASED_OUTPATIENT_CLINIC_OR_DEPARTMENT_OTHER): Payer: Medicare Other

## 2016-09-26 ENCOUNTER — Other Ambulatory Visit: Payer: Self-pay | Admitting: Unknown Physician Specialty

## 2016-09-26 DIAGNOSIS — D110 Benign neoplasm of parotid gland: Secondary | ICD-10-CM

## 2016-10-03 ENCOUNTER — Ambulatory Visit: Payer: Medicare Other

## 2016-10-03 ENCOUNTER — Ambulatory Visit
Admission: RE | Admit: 2016-10-03 | Discharge: 2016-10-03 | Disposition: A | Discharge status: Home / Self Care | Payer: Medicare Other | Source: Ambulatory Visit | Attending: Unknown Physician Specialty | Admitting: Unknown Physician Specialty

## 2016-10-03 DIAGNOSIS — R590 Localized enlarged lymph nodes: Secondary | ICD-10-CM | POA: Insufficient documentation

## 2016-10-03 DIAGNOSIS — D110 Benign neoplasm of parotid gland: Secondary | ICD-10-CM | POA: Diagnosis not present

## 2016-10-03 DIAGNOSIS — J90 Pleural effusion, not elsewhere classified: Secondary | ICD-10-CM | POA: Diagnosis not present

## 2016-10-03 HISTORY — DX: Heart failure, unspecified: I50.9

## 2016-10-03 LAB — POCT I-STAT CREATININE: Creatinine, Ser: 0.8 mg/dL (ref 0.61–1.24)

## 2016-10-03 MED ORDER — IOPAMIDOL (ISOVUE-300) INJECTION 61%
75.0000 mL | Freq: Once | INTRAVENOUS | Status: AC | PRN
  Administered 2016-10-03: 75 mL via INTRAVENOUS

## 2016-10-15 ENCOUNTER — Other Ambulatory Visit: Payer: Self-pay | Admitting: Unknown Physician Specialty

## 2016-10-15 DIAGNOSIS — R222 Localized swelling, mass and lump, trunk: Secondary | ICD-10-CM

## 2016-10-15 DIAGNOSIS — K118 Other diseases of salivary glands: Secondary | ICD-10-CM

## 2016-10-16 ENCOUNTER — Other Ambulatory Visit: Payer: Self-pay | Admitting: Radiology

## 2016-10-16 NOTE — Discharge Instructions (Signed)
Needle Biopsy, Care After °Introduction °Refer to this sheet in the next few weeks. These instructions provide you with information about caring for yourself after your procedure. Your health care provider may also give you more specific instructions. Your treatment has been planned according to current medical practices, but problems sometimes occur. Call your health care provider if you have any problems or questions after your procedure. °What can I expect after the procedure? °After your procedure, it is common to have soreness, bruising, or mild pain at the biopsy site. This should go away in a few days. °Follow these instructions at home: °· Rest as directed by your health care provider. °· Take medicines only as directed by your health care provider. °· There are many different ways to close and cover the biopsy site, including stitches (sutures), skin glue, and adhesive strips. Follow your health care provider's instructions about: °¨ Biopsy site care. °¨ Bandage (dressing) changes and removal. °¨ Biopsy site closure removal. °· Check your biopsy site every day for signs of infection. Watch for: °¨ Redness, swelling, or pain. °¨ Fluid, blood, or pus. °Contact a health care provider if: °· You have a fever. °· You have redness, swelling, or pain at the biopsy site that lasts longer than a few days. °· You have fluid, blood, or pus coming from the biopsy site. °· You feel nauseous. °· You vomit. °Get help right away if: °· You have shortness of breath. °· You have trouble breathing. °· You have chest pain. °· You feel dizzy or you faint. °· You have bleeding that does not stop with pressure or a bandage. °· You cough up blood. °· You have pain in your abdomen. °This information is not intended to replace advice given to you by your health care provider. Make sure you discuss any questions you have with your health care provider. °Document Released: 03/01/2015 Document Revised: 03/22/2016 Document Reviewed:  10/11/2014 °© 2017 Elsevier ° °

## 2016-10-17 ENCOUNTER — Other Ambulatory Visit: Payer: Self-pay | Admitting: Unknown Physician Specialty

## 2016-10-17 ENCOUNTER — Ambulatory Visit
Admission: RE | Admit: 2016-10-17 | Discharge: 2016-10-17 | Disposition: A | Discharge status: Home / Self Care | Payer: Medicare Other | Source: Ambulatory Visit | Attending: Unknown Physician Specialty | Admitting: Unknown Physician Specialty

## 2016-10-17 DIAGNOSIS — R590 Localized enlarged lymph nodes: Secondary | ICD-10-CM | POA: Diagnosis not present

## 2016-10-17 DIAGNOSIS — I425 Other restrictive cardiomyopathy: Secondary | ICD-10-CM | POA: Insufficient documentation

## 2016-10-17 DIAGNOSIS — R05 Cough: Secondary | ICD-10-CM | POA: Insufficient documentation

## 2016-10-17 DIAGNOSIS — K118 Other diseases of salivary glands: Secondary | ICD-10-CM

## 2016-10-17 DIAGNOSIS — E041 Nontoxic single thyroid nodule: Secondary | ICD-10-CM | POA: Insufficient documentation

## 2016-10-17 DIAGNOSIS — Z87891 Personal history of nicotine dependence: Secondary | ICD-10-CM | POA: Insufficient documentation

## 2016-10-17 DIAGNOSIS — R222 Localized swelling, mass and lump, trunk: Secondary | ICD-10-CM

## 2016-10-17 DIAGNOSIS — I509 Heart failure, unspecified: Secondary | ICD-10-CM | POA: Diagnosis not present

## 2016-10-17 DIAGNOSIS — Z79899 Other long term (current) drug therapy: Secondary | ICD-10-CM | POA: Diagnosis not present

## 2016-10-17 LAB — CBC
HCT: 41.3 % (ref 40.0–52.0)
Hemoglobin: 13.9 g/dL (ref 13.0–18.0)
MCH: 29.2 pg (ref 26.0–34.0)
MCHC: 33.6 g/dL (ref 32.0–36.0)
MCV: 87 fL (ref 80.0–100.0)
Platelets: 209 10*3/uL (ref 150–440)
RBC: 4.75 MIL/uL (ref 4.40–5.90)
RDW: 18.9 % — ABNORMAL HIGH (ref 11.5–14.5)
WBC: 2.6 10*3/uL — ABNORMAL LOW (ref 3.8–10.6)

## 2016-10-17 LAB — PROTIME-INR
INR: 1.46
Prothrombin Time: 17.9 seconds — ABNORMAL HIGH (ref 11.4–15.2)

## 2016-10-17 LAB — APTT: aPTT: 35 seconds (ref 24–36)

## 2016-10-17 MED ORDER — SODIUM CHLORIDE 0.9 % IV SOLN: INTRAVENOUS | Status: DC

## 2016-10-17 NOTE — Progress Notes (Signed)
Dr Louanna Raw in to speak with pt with questions answered regarding procedure, pt and md agreed upon no iv placement, pt states he was ok for no iv,just numbing area. COnsent signed.

## 2016-10-17 NOTE — H&P (Signed)
Chief Complaint: Patient was seen in consultation today for ultrasound guided biopsies at the request of [place]  Referring Physician(s): McQueen,Chapman  Patient Status: [facility] - Out-pt  History of Present Illness: Charles Hawkins is a 45 y.o. male with a complex medical history including restrictive cardiomyopathy. Patient recently had a brain MRI that demonstrated a small nodule in the deep left parotid lobe. Patient also had a neck CT which demonstrated a vague nodule in the left parotid region and also demonstrated some lymphadenopathy in the mediastinum and right supraclavicular area. Patient has been referred to radiology for fine-needle aspiration of the left parotid lesion and biopsy of the supraclavicular lymph node. Patient has had a long-standing cough for approximately 2 months. He says that the cough is getting better but still has difficulty breathing when he is lying down. He stopped antibiotics approximately 1 month ago.   Past Medical History:  Diagnosis Date  . Arthritis   . CHF (congestive heart failure) ([place])   . HTN (hypertension)   . 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 Multiple areas of subendocardial enhancement on MRI. Fat pad and myocardial biopsy negative at [facility].     No past surgical history on file.  Allergies: Patient has no known allergies.  Medications: Prior to Admission medications   Medication Sig Start Date End Date Taking? Authorizing Provider  amiodarone (PACERONE) 100 MG tablet Take 1 tablet (100 mg total) by mouth daily. 03/22/15  Yes Larey Dresser, MD  amiodarone (PACERONE) 200 MG tablet TAKE 1 TABLET (200 MG TOTAL) BY MOUTH 2 (TWO) TIMES DAILY. 05/17/15  Yes Jolaine Artist, MD  buPROPion [facility] SR) 150 MG 12 hr tablet Take 1 tablet (150 mg total) by mouth 2 (two) times daily. 04/21/15  Yes Shaune Pascal Bensimhon, MD  carvedilol (COREG) 6.25 MG tablet TAKE 1 TABLET BY MOUTH TWICE A DAY 06/20/15  Yes Shaune Pascal Bensimhon,  MD  ELIQUIS 5 MG TABS tablet TAKE 1 TABLET (5 MG TOTAL) BY MOUTH 2 (TWO) TIMES DAILY. 05/09/16  Yes Jolaine Artist, MD  furosemide (LASIX) 40 MG tablet Take 40 mg (1 tablet) in the morningTake 20 mg ( 1/2 tablet) in the evening 08/22/15  Yes Larey Dresser, MD  Multiple Vitamin (MULTIVITAMIN) tablet Take 1 tablet by mouth daily.   Yes Historical Provider, MD  potassium chloride (MICRO-K) 10 MEQ CR capsule TAKE 2 TABLETS (20 MEQ TOTAL) BY MOUTH DAILY. 05/14/16  Yes Larey Dresser, MD  pregabalin (LYRICA) 75 MG capsule Take 75 mg by mouth every other day.    Yes Historical Provider, MD  sacubitril-valsartan (ENTRESTO) 24-26 MG Take 1 tablet by mouth 2 (two) times daily. 04/21/15  Yes Shirley Friar, PA-C  nicotine (NICODERM CQ - DOSED IN MG/24 HR) 7 mg/24hr patch PLACE 1 PATCH (7 MG TOTAL) ONTO THE SKIN DAILY. Patient not taking: Reported on 10/17/2016 05/03/15   Larey Dresser, MD     Family History  Problem Relation Age of Onset  . Heart failure Son 1    s/p heart transplant  . Stroke Brother 23    Deceased  . Stroke Mother   . Hypertension Father     Social History   Social History  . Marital status: Single    Spouse name: N/A  . Number of children: N/A  . Years of education: N/A   Social History Main Topics  . Smoking status: Former Smoker    Packs/day: 0.25    Types: Cigarettes  .  Smokeless tobacco: Not on file  . Alcohol use Yes     Comment: occasionally  . Drug use:      Comment: marijuana occasionally  . Sexual activity: Not on file   Other Topics Concern  . Not on file   Social History Narrative  . No narrative on file    Review of Systems  Constitutional: Negative.   Respiratory: Positive for cough and shortness of breath.     Vital Signs: BP 114/85   Pulse 88   Temp 98.7 F (37.1 C) (Oral)   Resp 20   Ht 5\' 8"  (1.727 m)   Wt 165 lb (74.8 kg)   SpO2 96%   BMI 25.09 kg/m   Physical Exam  Cardiovascular:  Irregular heart rhythm.    Pulmonary/Chest: Effort normal and breath sounds normal.  Abdominal: Soft. Bowel sounds are normal.    Mallampati Score:  MD Evaluation Airway: WNL Heart: Other (comments) Heart  comments: Irregular heart beat Abdomen: WNL Chest/ Lungs: WNL ASA  Classification: 2 Mallampati/Airway Score: Two  Imaging: Ct Soft Tissue Neck W Contrast  Result Date: 10/03/2016 CLINICAL DATA:  44 year old male with possible left deep lobe parotid mass. The patient cannot palpate the lesion. He reports congestion and nonproductive cough for 48 weeks. Initial encounter. EXAM: CT NECK WITH CONTRAST TECHNIQUE: Multidetector CT imaging of the neck was performed using the standard protocol following the bolus administration of intravenous contrast. CONTRAST:  34mL ISOVUE-300 IOPAMIDOL (ISOVUE-300) INJECTION 61% COMPARISON:  By report there was a recent neck ultrasound, but I find no images or report of that exam in [facility] PACS or toggle case. There was a [facility] lower extremity ultrasound on 07/31/2016. Prior chest radiographs 10/05/2014. FINDINGS: Pharynx and larynx: The glottis is closed. Laryngeal soft tissue contours are within normal limits. Pharyngeal soft tissue contours are within normal limits. Both parapharyngeal spaces appear normal. Negative retropharyngeal space. Salivary glands: Negative sublingual space. The submandibular glands appear symmetric and normal aside from mild hilar ductal ectasia. No sialolithiasis. The right parotid gland appears within normal limits. The left parotid gland also appears largely normal. There could be a small nearly isointense 11 mm soft tissue nodule in the deep lobe of the parotid as seen on series 2, image 24, but this might instead be streak artifact from the adjacent mandibular ramus. There is no mass effect or abnormality of the left parapharyngeal space. The left stylomastoid foramen appears normal by CT. Thyroid: Negative. Lymph nodes: Asymmetric appearance of  the bilateral level 4 nodal stations near the thoracic inlet. On the left there is indistinct fat stranding best seen on coronal image 56, and at the right level 4 and supraclavicular nodal station there is an abnormal number of lymph nodes ranging from 3 mm short axis up to 9 in 10 mm short axis. See also series 2, image 77. However, the bilateral level 3 nodal stations then appears symmetric and normal. The bilateral level 2 nodal stations also appears symmetric and within normal limits. Small level 1 lymph nodes are within normal limits. No parotid space lymphadenopathy identified. Vascular: Major vascular flow voids in the neck and at the skullbase are patent. Minimal carotid atherosclerosis with no stenosis in the neck. Limited intracranial: Negative. Visualized orbits: Negative. Mastoids and visualized paranasal sinuses: Clear. Skeleton: No acute osseous abnormality identified. Upper chest: Abnormal superior mediastinum with increased size and number of lymph nodes at all visible mediastinal nodal stations. The largest prevascular nodes are 12 mm short axis. There are  right peritracheal and anterior carina nodes up to 12 mm short axis. Visible left axillary lymph nodes by comparison appear fairly normal. There is a layering right pleural effusion, and a right pleural effusion was also present in 2015. Scattered pulmonary nodularity, and partially visible more confluent abnormal peribronchial ground-glass and nodular opacity near the right hilum. Trace retained secretions in the left mainstem bronchus. IMPRESSION: 1. No definite left parotid lesion, although it is difficult to exclude 11 mm isodense nodule in the deep lobe immediately posterior to the left mandible ramus on series 2, image 24. Face MRI (or an IAC protocol brain MRI with special instruction to include all of the parotid tissue) without and with contrast would best confirm. 2. Abnormal lymphadenopathy at the thoracic inlet and in the visualized  mediastinum. Superimposed right pleural effusion (apparently chronic) and abnormal pulmonary nodularity. Sarcoidosis would be a leading consideration, although the appearance is nonspecific and lymphoproliferative disorders and metastatic disease could also have this appearance. 3. No cervical lymphadenopathy above the level 4 stations. Electronically Signed   By: Genevie Ann M.D.   On: 10/03/2016 14:04    Labs:  CBC:  Recent Labs  10/17/16 1336  WBC 2.6*  HGB 13.9  HCT 41.3  PLT 209    COAGS:  Recent Labs  10/17/16 1336  INR 1.46  APTT 35    BMP:  Recent Labs  10/03/16 1114  CREATININE 0.80    LIVER FUNCTION TESTS: No results for input(s): BILITOT, AST, ALT, ALKPHOS, PROT, ALBUMIN in the last 8760 hours.  TUMOR MARKERS: No results for input(s): AFPTM, CEA, CA199, CHROMGRNA in the last 8760 hours.  Assessment and Plan:  45 year old male with left parotid lesion and right supraclavicular lymphadenopathy. Plan for ultrasound guided biopsy of both areas. Discussed the procedure with the patient and informed consent was obtained. Patient would like to try the procedures without moderate sedation and local anesthetic only. Plan for ultrasound-guided fine-needle aspiration of the left parotid lesion and core biopsy of the right supraclavicular lymph nodes.  Thank you for this interesting consult.  I greatly enjoyed meeting [facility] and look forward to participating in their care.  A copy of this report was sent to the requesting provider on this date.  Electronically Signed: Carylon Perches 10/17/2016, 2:04 PM   I spent a total of  15 Minutes   in face to face in clinical consultation, greater than 50% of which was counseling/coordinating care for ultrasound guided biopsies.

## 2016-10-17 NOTE — Procedures (Signed)
US guided FNA of right supraclavicular lymph node.  6 FNAs obtained, attempted cores but unsuccessful.  No immediate complication.  Evaluated left parotid gland.  Identified small intra-parotid lymph nodes, no suspicious parotid lesion.  No parotid biopsy performed.

## 2016-10-18 LAB — CYTOLOGY - NON PAP

## 2016-10-24 ENCOUNTER — Ambulatory Visit: Payer: Medicare Other

## 2016-11-07 ENCOUNTER — Encounter (HOSPITAL_BASED_OUTPATIENT_CLINIC_OR_DEPARTMENT_OTHER): Payer: Medicare Other

## 2016-11-08 DIAGNOSIS — I4439 Other atrioventricular block: Secondary | ICD-10-CM | POA: Diagnosis not present

## 2016-11-08 DIAGNOSIS — Z7901 Long term (current) use of anticoagulants: Secondary | ICD-10-CM | POA: Diagnosis not present

## 2016-11-08 DIAGNOSIS — R9431 Abnormal electrocardiogram [ECG] [EKG]: Secondary | ICD-10-CM | POA: Diagnosis not present

## 2016-11-08 DIAGNOSIS — F17200 Nicotine dependence, unspecified, uncomplicated: Secondary | ICD-10-CM | POA: Diagnosis not present

## 2016-11-08 DIAGNOSIS — I425 Other restrictive cardiomyopathy: Secondary | ICD-10-CM | POA: Diagnosis not present

## 2016-11-08 DIAGNOSIS — Z79899 Other long term (current) drug therapy: Secondary | ICD-10-CM | POA: Diagnosis not present

## 2016-11-08 DIAGNOSIS — I5022 Chronic systolic (congestive) heart failure: Secondary | ICD-10-CM | POA: Diagnosis not present

## 2016-11-08 DIAGNOSIS — I421 Obstructive hypertrophic cardiomyopathy: Secondary | ICD-10-CM | POA: Diagnosis not present

## 2016-11-08 DIAGNOSIS — I422 Other hypertrophic cardiomyopathy: Secondary | ICD-10-CM | POA: Diagnosis not present

## 2016-11-08 DIAGNOSIS — I4892 Unspecified atrial flutter: Secondary | ICD-10-CM | POA: Diagnosis not present

## 2016-11-08 DIAGNOSIS — I517 Cardiomegaly: Secondary | ICD-10-CM | POA: Diagnosis not present

## 2016-11-08 DIAGNOSIS — F419 Anxiety disorder, unspecified: Secondary | ICD-10-CM | POA: Diagnosis not present

## 2016-11-14 ENCOUNTER — Other Ambulatory Visit: Payer: Self-pay | Admitting: Internal Medicine

## 2016-12-03 DIAGNOSIS — I425 Other restrictive cardiomyopathy: Secondary | ICD-10-CM | POA: Diagnosis not present

## 2016-12-03 DIAGNOSIS — K219 Gastro-esophageal reflux disease without esophagitis: Secondary | ICD-10-CM | POA: Diagnosis not present

## 2016-12-03 DIAGNOSIS — I429 Cardiomyopathy, unspecified: Secondary | ICD-10-CM | POA: Diagnosis not present

## 2016-12-03 DIAGNOSIS — J309 Allergic rhinitis, unspecified: Secondary | ICD-10-CM | POA: Diagnosis not present

## 2016-12-03 DIAGNOSIS — Z0000 Encounter for general adult medical examination without abnormal findings: Secondary | ICD-10-CM | POA: Diagnosis not present

## 2016-12-03 DIAGNOSIS — Z720 Tobacco use: Secondary | ICD-10-CM | POA: Diagnosis not present

## 2016-12-03 DIAGNOSIS — I10 Essential (primary) hypertension: Secondary | ICD-10-CM | POA: Diagnosis not present

## 2016-12-03 DIAGNOSIS — G629 Polyneuropathy, unspecified: Secondary | ICD-10-CM | POA: Diagnosis not present

## 2016-12-03 DIAGNOSIS — I5020 Unspecified systolic (congestive) heart failure: Secondary | ICD-10-CM | POA: Diagnosis not present

## 2016-12-03 DIAGNOSIS — Z125 Encounter for screening for malignant neoplasm of prostate: Secondary | ICD-10-CM | POA: Diagnosis not present

## 2016-12-12 ENCOUNTER — Other Ambulatory Visit: Payer: Self-pay | Admitting: Cardiology

## 2017-01-07 NOTE — Addendum Note (Signed)
Encounter addended by: Vernie Murders, RT on: 01/07/2017 10:29 AM<BR>    Actions taken: Imaging Exam ended, Charge Capture section accepted

## 2017-01-22 ENCOUNTER — Other Ambulatory Visit: Payer: Self-pay | Admitting: Cardiology

## 2017-02-07 ENCOUNTER — Other Ambulatory Visit: Payer: Self-pay | Admitting: Cardiology

## 2017-02-12 DIAGNOSIS — F419 Anxiety disorder, unspecified: Secondary | ICD-10-CM | POA: Diagnosis not present

## 2017-02-12 DIAGNOSIS — I425 Other restrictive cardiomyopathy: Secondary | ICD-10-CM | POA: Diagnosis not present

## 2017-02-12 DIAGNOSIS — I5022 Chronic systolic (congestive) heart failure: Secondary | ICD-10-CM | POA: Diagnosis not present

## 2017-02-12 DIAGNOSIS — I422 Other hypertrophic cardiomyopathy: Secondary | ICD-10-CM | POA: Diagnosis not present

## 2017-02-12 DIAGNOSIS — Z79899 Other long term (current) drug therapy: Secondary | ICD-10-CM | POA: Diagnosis not present

## 2017-02-12 DIAGNOSIS — Z7901 Long term (current) use of anticoagulants: Secondary | ICD-10-CM | POA: Diagnosis not present

## 2017-02-12 DIAGNOSIS — I4892 Unspecified atrial flutter: Secondary | ICD-10-CM | POA: Diagnosis not present

## 2017-02-20 ENCOUNTER — Other Ambulatory Visit (HOSPITAL_COMMUNITY): Payer: Self-pay | Admitting: Cardiology

## 2017-02-26 DIAGNOSIS — E039 Hypothyroidism, unspecified: Secondary | ICD-10-CM | POA: Diagnosis not present

## 2017-02-26 DIAGNOSIS — I10 Essential (primary) hypertension: Secondary | ICD-10-CM | POA: Diagnosis not present

## 2017-02-26 DIAGNOSIS — K219 Gastro-esophageal reflux disease without esophagitis: Secondary | ICD-10-CM | POA: Diagnosis not present

## 2017-02-26 DIAGNOSIS — R7303 Prediabetes: Secondary | ICD-10-CM | POA: Diagnosis not present

## 2017-02-26 DIAGNOSIS — Z720 Tobacco use: Secondary | ICD-10-CM | POA: Diagnosis not present

## 2017-02-26 DIAGNOSIS — J309 Allergic rhinitis, unspecified: Secondary | ICD-10-CM | POA: Diagnosis not present

## 2017-02-26 DIAGNOSIS — I425 Other restrictive cardiomyopathy: Secondary | ICD-10-CM | POA: Diagnosis not present

## 2017-02-26 DIAGNOSIS — I5020 Unspecified systolic (congestive) heart failure: Secondary | ICD-10-CM | POA: Diagnosis not present

## 2017-03-11 ENCOUNTER — Other Ambulatory Visit (HOSPITAL_COMMUNITY): Payer: Self-pay | Admitting: *Deleted

## 2017-03-11 MED ORDER — POTASSIUM CHLORIDE ER 10 MEQ PO CPCR: ORAL_CAPSULE | ORAL | 3 refills | Status: DC

## 2017-05-15 DIAGNOSIS — Z720 Tobacco use: Secondary | ICD-10-CM | POA: Diagnosis not present

## 2017-05-15 DIAGNOSIS — I5020 Unspecified systolic (congestive) heart failure: Secondary | ICD-10-CM | POA: Diagnosis not present

## 2017-05-15 DIAGNOSIS — K219 Gastro-esophageal reflux disease without esophagitis: Secondary | ICD-10-CM | POA: Diagnosis not present

## 2017-05-15 DIAGNOSIS — R7303 Prediabetes: Secondary | ICD-10-CM | POA: Diagnosis not present

## 2017-05-15 DIAGNOSIS — J309 Allergic rhinitis, unspecified: Secondary | ICD-10-CM | POA: Diagnosis not present

## 2017-05-15 DIAGNOSIS — E039 Hypothyroidism, unspecified: Secondary | ICD-10-CM | POA: Diagnosis not present

## 2017-05-15 DIAGNOSIS — I10 Essential (primary) hypertension: Secondary | ICD-10-CM | POA: Diagnosis not present

## 2017-05-15 DIAGNOSIS — I425 Other restrictive cardiomyopathy: Secondary | ICD-10-CM | POA: Diagnosis not present

## 2017-05-27 DIAGNOSIS — J309 Allergic rhinitis, unspecified: Secondary | ICD-10-CM | POA: Diagnosis not present

## 2017-05-27 DIAGNOSIS — R7303 Prediabetes: Secondary | ICD-10-CM | POA: Diagnosis not present

## 2017-05-27 DIAGNOSIS — K219 Gastro-esophageal reflux disease without esophagitis: Secondary | ICD-10-CM | POA: Diagnosis not present

## 2017-05-27 DIAGNOSIS — I10 Essential (primary) hypertension: Secondary | ICD-10-CM | POA: Diagnosis not present

## 2017-05-27 DIAGNOSIS — E039 Hypothyroidism, unspecified: Secondary | ICD-10-CM | POA: Diagnosis not present

## 2017-05-27 DIAGNOSIS — Z720 Tobacco use: Secondary | ICD-10-CM | POA: Diagnosis not present

## 2017-05-27 DIAGNOSIS — I5020 Unspecified systolic (congestive) heart failure: Secondary | ICD-10-CM | POA: Diagnosis not present

## 2017-05-27 DIAGNOSIS — I425 Other restrictive cardiomyopathy: Secondary | ICD-10-CM | POA: Diagnosis not present

## 2017-07-03 ENCOUNTER — Other Ambulatory Visit: Payer: Self-pay | Admitting: Internal Medicine

## 2017-07-16 ENCOUNTER — Other Ambulatory Visit: Payer: Self-pay | Admitting: Internal Medicine

## 2017-09-03 DIAGNOSIS — K219 Gastro-esophageal reflux disease without esophagitis: Secondary | ICD-10-CM | POA: Diagnosis not present

## 2017-09-03 DIAGNOSIS — I10 Essential (primary) hypertension: Secondary | ICD-10-CM | POA: Diagnosis not present

## 2017-09-03 DIAGNOSIS — E039 Hypothyroidism, unspecified: Secondary | ICD-10-CM | POA: Diagnosis not present

## 2017-09-03 DIAGNOSIS — J309 Allergic rhinitis, unspecified: Secondary | ICD-10-CM | POA: Diagnosis not present

## 2017-09-03 DIAGNOSIS — I5020 Unspecified systolic (congestive) heart failure: Secondary | ICD-10-CM | POA: Diagnosis not present

## 2017-09-03 DIAGNOSIS — Z23 Encounter for immunization: Secondary | ICD-10-CM | POA: Diagnosis not present

## 2017-09-03 DIAGNOSIS — Z720 Tobacco use: Secondary | ICD-10-CM | POA: Diagnosis not present

## 2017-09-03 DIAGNOSIS — I425 Other restrictive cardiomyopathy: Secondary | ICD-10-CM | POA: Diagnosis not present

## 2017-09-03 DIAGNOSIS — R7303 Prediabetes: Secondary | ICD-10-CM | POA: Diagnosis not present

## 2017-12-01 ENCOUNTER — Emergency Department (HOSPITAL_COMMUNITY)
Admission: EM | Admit: 2017-12-01 | Discharge: 2017-12-01 | Disposition: A | Discharge status: Home / Self Care | Payer: Medicare Other | Attending: Emergency Medicine | Admitting: Emergency Medicine

## 2017-12-01 ENCOUNTER — Emergency Department (HOSPITAL_COMMUNITY): Payer: Medicare Other

## 2017-12-01 ENCOUNTER — Other Ambulatory Visit: Payer: Self-pay

## 2017-12-01 ENCOUNTER — Encounter (HOSPITAL_COMMUNITY): Payer: Self-pay

## 2017-12-01 DIAGNOSIS — I110 Hypertensive heart disease with heart failure: Secondary | ICD-10-CM | POA: Diagnosis not present

## 2017-12-01 DIAGNOSIS — Z79899 Other long term (current) drug therapy: Secondary | ICD-10-CM | POA: Insufficient documentation

## 2017-12-01 DIAGNOSIS — I5032 Chronic diastolic (congestive) heart failure: Secondary | ICD-10-CM | POA: Diagnosis not present

## 2017-12-01 DIAGNOSIS — M79641 Pain in right hand: Secondary | ICD-10-CM | POA: Diagnosis not present

## 2017-12-01 DIAGNOSIS — M25521 Pain in right elbow: Secondary | ICD-10-CM | POA: Diagnosis not present

## 2017-12-01 DIAGNOSIS — Z7901 Long term (current) use of anticoagulants: Secondary | ICD-10-CM | POA: Diagnosis not present

## 2017-12-01 DIAGNOSIS — Z87891 Personal history of nicotine dependence: Secondary | ICD-10-CM | POA: Insufficient documentation

## 2017-12-01 DIAGNOSIS — M7521 Bicipital tendinitis, right shoulder: Secondary | ICD-10-CM | POA: Diagnosis not present

## 2017-12-01 DIAGNOSIS — M7989 Other specified soft tissue disorders: Secondary | ICD-10-CM | POA: Diagnosis not present

## 2017-12-01 LAB — COMPREHENSIVE METABOLIC PANEL
ALT: 30 U/L (ref 17–63)
AST: 50 U/L — ABNORMAL HIGH (ref 15–41)
Albumin: 3.6 g/dL (ref 3.5–5.0)
Alkaline Phosphatase: 195 U/L — ABNORMAL HIGH (ref 38–126)
Anion gap: 13 (ref 5–15)
BUN: 10 mg/dL (ref 6–20)
CO2: 22 mmol/L (ref 22–32)
Calcium: 9 mg/dL (ref 8.9–10.3)
Chloride: 96 mmol/L — ABNORMAL LOW (ref 101–111)
Creatinine, Ser: 0.96 mg/dL (ref 0.61–1.24)
GFR calc Af Amer: >60 mL/min (ref >60)
GFR calc non Af Amer: >60 mL/min (ref >60)
Glucose, Bld: 92 mg/dL (ref 65–99)
Potassium: 3.7 mmol/L (ref 3.5–5.1)
Sodium: 131 mmol/L — ABNORMAL LOW (ref 135–145)
Total Bilirubin: 2.8 mg/dL — ABNORMAL HIGH (ref 0.3–1.2)
Total Protein: 7.9 g/dL (ref 6.5–8.1)

## 2017-12-01 LAB — CBC WITH DIFFERENTIAL/PLATELET
Basophils Absolute: 0 10*3/uL (ref 0.0–0.1)
Basophils Relative: 1 %
Eosinophils Absolute: 0 10*3/uL (ref 0.0–0.7)
Eosinophils Relative: 0 %
HCT: 45.2 % (ref 39.0–52.0)
Hemoglobin: 15.5 g/dL (ref 13.0–17.0)
Lymphocytes Relative: 35 %
Lymphs Abs: 1.3 10*3/uL (ref 0.7–4.0)
MCH: 31.8 pg (ref 26.0–34.0)
MCHC: 34.3 g/dL (ref 30.0–36.0)
MCV: 92.8 fL (ref 78.0–100.0)
Monocytes Absolute: 0.5 10*3/uL (ref 0.1–1.0)
Monocytes Relative: 13 %
Neutro Abs: 1.9 10*3/uL (ref 1.7–7.7)
Neutrophils Relative %: 51 %
Platelets: 185 10*3/uL (ref 150–400)
RBC: 4.87 MIL/uL (ref 4.22–5.81)
RDW: 15.1 % (ref 11.5–15.5)
WBC: 3.7 10*3/uL — ABNORMAL LOW (ref 4.0–10.5)

## 2017-12-01 LAB — C-REACTIVE PROTEIN: CRP: 4.6 mg/dL — ABNORMAL HIGH (ref <1.0)

## 2017-12-01 LAB — SEDIMENTATION RATE: Sed Rate: 23 mm/hr — ABNORMAL HIGH (ref 0–16)

## 2017-12-01 MED ORDER — COLCHICINE 0.6 MG PO TABS
1.2000 mg | ORAL_TABLET | Freq: Once | ORAL | Status: AC
  Administered 2017-12-01: 1.2 mg via ORAL
  Filled 2017-12-01: qty 2

## 2017-12-01 MED ORDER — GADOBENATE DIMEGLUMINE 529 MG/ML IV SOLN
15.0000 mL | Freq: Once | INTRAVENOUS | Status: AC | PRN
  Administered 2017-12-01: 15 mL via INTRAVENOUS

## 2017-12-01 MED ORDER — COLCHICINE 0.6 MG PO TABS: 0.6000 mg | ORAL_TABLET | Freq: Every day | ORAL | 0 refills | Status: DC

## 2017-12-01 NOTE — ED Notes (Signed)
Patient transported to MRI 

## 2017-12-01 NOTE — ED Triage Notes (Signed)
Hot tea given to family.

## 2017-12-01 NOTE — ED Notes (Signed)
Patient transported to X-ray 

## 2017-12-01 NOTE — ED Triage Notes (Signed)
Patient complains of right elbow swelling for the past several days and right hand pain. Patient thinks its gout or arthritis. No trauma

## 2017-12-01 NOTE — Discharge Instructions (Signed)
Please take Colchicine 0.6mg  1-2 times a day for the next week or until the pain and swelling gets better Please follow up with rheumatology Return if worsening

## 2017-12-01 NOTE — ED Provider Notes (Signed)
[place] EMERGENCY DEPARTMENT Provider Note   CSN: [id number] Arrival date & time: 12/01/17  1044     History   Chief Complaint No chief complaint on file.   HPI Charles Hawkins is a 47 y.o. male who presents with right elbow pain and right hand pain. PMH significant for infiltrative restrictive cardiomyopathy, CHF EF 55% (in 2016), A flutter on Eliquis, HTN, hx of gout and arthritis. He states that he first started to have issues with his right elbow and hand about a month ago. It got better on its own. Over the past couple of days the swelling and pain has return and gradually worsened. He cannot fully range his elbow. He has been diagnosed with osteoarthritis in the past and gout. He has not had any imaging of his joints. He previously worked as a Administrator.  Cardiologist: Dr. Posey Pronto at [facility] PCP: Osei-Bonsu  HPI  Past Medical History:  Diagnosis Date  . Arthritis   . CHF (congestive heart failure) ([place])   . HTN (hypertension)   . 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 Multiple areas of subendocardial enhancement on MRI. Fat pad and myocardial biopsy negative at [facility].     Patient Active Problem List   Diagnosis Date Noted  . Atrial flutter ([place]) 12/06/2014  . Chronic diastolic heart failure ([place]) 03/03/2014  . Palpitations 03/03/2014  . Acute gout 02/06/2014  . Infiltrative cardiomyopathy ([place]) 02/06/2014    History reviewed. No pertinent surgical history.     Home Medications    Prior to Admission medications   Medication Sig Start Date End Date Taking? Authorizing Provider  amiodarone (PACERONE) 100 MG tablet Take 1 tablet (100 mg total) by mouth daily. 03/22/15   Larey Dresser, MD  amiodarone (PACERONE) 200 MG tablet TAKE 1 TABLET (200 MG TOTAL) BY MOUTH 2 (TWO) TIMES DAILY. 05/17/15   Bensimhon, Shaune Pascal, MD  buPROPion (WELLBUTRIN SR) 150 MG 12 hr tablet Take 1 tablet (150 mg total) by mouth 2 (two) times daily. 04/21/15    Bensimhon, Shaune Pascal, MD  carvedilol (COREG) 6.25 MG tablet TAKE 1 TABLET BY MOUTH TWICE A DAY 06/20/15   Bensimhon, Shaune Pascal, MD  ELIQUIS 5 MG TABS tablet TAKE 1 TABLET (5 MG TOTAL) BY MOUTH 2 (TWO) TIMES DAILY. 11/16/16   Bensimhon, Shaune Pascal, MD  furosemide (LASIX) 40 MG tablet Take 40 mg (1 tablet) in the morningTake 20 mg ( 1/2 tablet) in the evening 08/22/15   Larey Dresser, MD  Multiple Vitamin (MULTIVITAMIN) tablet Take 1 tablet by mouth daily.    [provider]  nicotine (NICODERM CQ - DOSED IN MG/24 HR) 7 mg/24hr patch PLACE 1 PATCH (7 MG TOTAL) ONTO THE SKIN DAILY. Patient not taking: Reported on 10/17/2016 05/03/15   Larey Dresser, MD  potassium chloride (MICRO-K) 10 MEQ CR capsule TAKE 2 TABLETS (20 MEQ TOTAL) BY MOUTH DAILY. 03/11/17   Larey Dresser, MD  pregabalin (LYRICA) 75 MG capsule Take 75 mg by mouth every other day.     [provider]  sacubitril-valsartan (ENTRESTO) 24-26 MG Take 1 tablet by mouth 2 (two) times daily. 04/21/15   Shirley Friar, PA-C    Family History Family History  Problem Relation Age of Onset  . Heart failure Son 1       s/p heart transplant  . Stroke Brother 40       Deceased  . Stroke Mother   . Hypertension Father  Social History Social History   Tobacco Use  . Smoking status: Former Smoker    Packs/day: 0.25    Types: Cigarettes  Substance Use Topics  . Alcohol use: Yes    Comment: occasionally  . Drug use: Yes    Comment: marijuana occasionally     Allergies   Patient has no known allergies.   Review of Systems Review of Systems  Constitutional: Negative for fever.  Musculoskeletal: Positive for arthralgias and joint swelling. Negative for gait problem.  Skin: Negative for wound.  Neurological: Negative for weakness and numbness.     Physical Exam Updated Vital Signs BP 118/84 (BP Location: Left Arm)   Pulse 75   Temp 99 F (37.2 C) (Oral)   Resp 12   Ht 5\' 8"  (1.727 m)   Wt  73.5 kg (162 lb)   SpO2 99%   BMI 24.63 kg/m   Physical Exam  Constitutional: He is oriented to person, place, and time. He appears well-developed and well-nourished. No distress.  HENT:  Head: Normocephalic and atraumatic.  Eyes: Conjunctivae are normal. Pupils are equal, round, and reactive to light. Right eye exhibits no discharge. Left eye exhibits no discharge. No scleral icterus.  Neck: Normal range of motion.  Cardiovascular: Normal rate.  Pulmonary/Chest: Effort normal. No respiratory distress.  Abdominal: He exhibits no distension.  Musculoskeletal:  Right elbow: Mild-moderate swelling of olecranon without redness or warmth. Tenderness to palpation of medial aspect of the elbow. ROM without pain to ~100 degrees of flexion. 5/5 strength. N/V intact.  Right hand: Mild ulnar deviation. MCP joints are swollen. There is a hard nodule over the dorsal aspect of hand over the 5th metacarpal which is nontender   Neurological: He is alert and oriented to person, place, and time.  Skin: Skin is warm and dry.  Psychiatric: He has a normal mood and affect. His behavior is normal.  Nursing note and vitals reviewed.    ED Treatments / Results  Labs (all labs ordered are listed, but only abnormal results are displayed) Labs Reviewed  CBC WITH DIFFERENTIAL/PLATELET - Abnormal; Notable for the following components:      Result Value   WBC 3.7 (*)    All other components within normal limits  COMPREHENSIVE METABOLIC PANEL - Abnormal; Notable for the following components:   Sodium 131 (*)    Chloride 96 (*)    AST 50 (*)    Alkaline Phosphatase 195 (*)    Total Bilirubin 2.8 (*)    All other components within normal limits  SEDIMENTATION RATE - Abnormal; Notable for the following components:   Sed Rate 23 (*)    All other components within normal limits  C-REACTIVE PROTEIN - Abnormal; Notable for the following components:   CRP 4.6 (*)    All other components within normal limits      EKG  EKG Interpretation None       Radiology Dg Elbow Complete Right  Result Date: 12/01/2017 CLINICAL DATA:  Right elbow swelling. EXAM: RIGHT ELBOW - COMPLETE 3+ VIEW COMPARISON:  None. FINDINGS: There is no evidence of fracture, or dislocation. Well corticated erosive changes are seen at the olecranon, where there is overlying soft tissue swelling. IMPRESSION: Well corticated erosive changes at the olecranon with overlying soft tissue swelling. These findings may represent inflammatory arthropathy or potentially osteomyelitis. Electronically Signed   By: Fidela Salisbury M.D.   On: 12/01/2017 12:55   Mr Elbow Right W Wo Contrast  Result Date: 12/01/2017 CLINICAL  DATA:  Right elbow pain. EXAM: MRI OF THE RIGHT ELBOW WITHOUT AND WITH CONTRAST TECHNIQUE: Multiplanar, multisequence MR imaging of the elbow was performed before and after the administration of intravenous contrast. CONTRAST:  10mL MULTIHANCE GADOBENATE DIMEGLUMINE 529 MG/ML IV SOLN COMPARISON:  None. FINDINGS: Patient motion degrades image quality limiting evaluation. TENDONS Common forearm flexor origin: Intact. Common forearm extensor origin: Intact. Biceps: Intact. Triceps: Severe tendinosis of the triceps tendon at its insertion with severe surrounding soft tissue inflammatory changes and a erosion in the olecranon adjacent to the triceps tendon insertion. 9 x 3 x 5 mm fluid collection along the anterior distal insertion of the triceps tendon adjacent to the erosion. These findings can be seen with a crystalline arthropathy such as gout versus less likely infection. LIGAMENTS Medial stabilizers: Intact. Lateral stabilizers:  Intact. Cartilage: No chondral defect. Joint: Small joint effusion. No synovitis. No intra-articular loose body. Cubital tunnel: Normal. Bones: No other marrow signal abnormality. No fracture or dislocation. No aggressive osseous lesion. Soft tissue: Muscles are normal.  No muscle atrophy. IMPRESSION: 1.  Severe tendinosis of the triceps tendon at its insertion with severe surrounding soft tissue inflammatory changes and a erosion in the olecranon adjacent to the triceps tendon insertion. These findings can be seen with a crystalline arthropathy such as gout versus less likely infection. Correlate with uric acid levels. Electronically Signed   By: Kathreen Devoid   On: 12/01/2017 16:30   Dg Hand Complete Right  Result Date: 12/01/2017 CLINICAL DATA:  RIGHT elbow swelling.  RIGHT hand pain. EXAM: RIGHT HAND - COMPLETE 3+ VIEW COMPARISON:  None. FINDINGS: There is no evidence of fracture or dislocation. There is no evidence of arthropathy or other focal bone abnormality. There is mild dorsal soft tissue swelling, at the MCP joints, but no erosive changes are seen. IMPRESSION: No abnormal osseous findings are identified. Dorsal soft tissue swelling Electronically Signed   By: Staci Righter M.D.   On: 12/01/2017 12:55    Procedures Procedures (including critical care time)  Medications Ordered in ED Medications - No data to display   Initial Impression / Assessment and Plan / ED Course  I have reviewed the triage vital signs and the nursing notes.  Pertinent labs & imaging results that were available during my care of the patient were reviewed by me and considered in my medical decision making (see chart for details).  47 year old male presents with atraumatic right elbow and right hand pain and swelling. Vitals are normal. Exam is remarkable for a significant amount of swelling. He is able to range the elbow and fingers without significant pain so doubt a septic joint. Xray obtained to further evaluated reveals soft tissue swelling of right hand without osseous abnormality and erosive changes of the olecranon. Most likely it is due to an inflammatory arthritis however osteomyelitis is on the differential. Discussed with Dr. Lita Mains. Will obtain lab work and MRI to further evaluate.  MRI shows evidence  of severe tendinosis, a joint effusion, and erosive arthropathy the most likely due to gout.  CBC is remarkable for mild leukopenia.  CMP is remarkable for mild hyponatremia, hypochloremia, and elevation of alkaline phosphatase, AST, bilirubin.  Sed rate and CRP were also mildly elevated.  Results were discussed with the patient and his wife.  They were given a dose of colchicine in the ED and a prescription for home.  They are again also given a referral for rheumatology.  Return precautions were given.   Final  Clinical Impressions(s) / ED Diagnoses   Final diagnoses:  Right elbow pain  Right hand pain    ED Discharge Orders    None       Recardo Evangelist, PA-C 12/01/17 1933    Julianne Rice, MD 12/05/17 [phone]

## 2017-12-26 DIAGNOSIS — I425 Other restrictive cardiomyopathy: Secondary | ICD-10-CM | POA: Diagnosis not present

## 2017-12-26 DIAGNOSIS — R898 Other abnormal findings in specimens from other organs, systems and tissues: Secondary | ICD-10-CM | POA: Diagnosis not present

## 2017-12-26 DIAGNOSIS — Z8249 Family history of ischemic heart disease and other diseases of the circulatory system: Secondary | ICD-10-CM | POA: Diagnosis not present

## 2017-12-26 DIAGNOSIS — I4892 Unspecified atrial flutter: Secondary | ICD-10-CM | POA: Diagnosis not present

## 2017-12-26 DIAGNOSIS — Z79899 Other long term (current) drug therapy: Secondary | ICD-10-CM | POA: Diagnosis not present

## 2017-12-31 DIAGNOSIS — Z0000 Encounter for general adult medical examination without abnormal findings: Secondary | ICD-10-CM | POA: Diagnosis not present

## 2017-12-31 DIAGNOSIS — I429 Cardiomyopathy, unspecified: Secondary | ICD-10-CM | POA: Diagnosis not present

## 2017-12-31 DIAGNOSIS — I5020 Unspecified systolic (congestive) heart failure: Secondary | ICD-10-CM | POA: Diagnosis not present

## 2017-12-31 DIAGNOSIS — I425 Other restrictive cardiomyopathy: Secondary | ICD-10-CM | POA: Diagnosis not present

## 2017-12-31 DIAGNOSIS — K219 Gastro-esophageal reflux disease without esophagitis: Secondary | ICD-10-CM | POA: Diagnosis not present

## 2017-12-31 DIAGNOSIS — Z125 Encounter for screening for malignant neoplasm of prostate: Secondary | ICD-10-CM | POA: Diagnosis not present

## 2017-12-31 DIAGNOSIS — J309 Allergic rhinitis, unspecified: Secondary | ICD-10-CM | POA: Diagnosis not present

## 2017-12-31 DIAGNOSIS — Z720 Tobacco use: Secondary | ICD-10-CM | POA: Diagnosis not present

## 2017-12-31 DIAGNOSIS — G629 Polyneuropathy, unspecified: Secondary | ICD-10-CM | POA: Diagnosis not present

## 2017-12-31 DIAGNOSIS — I10 Essential (primary) hypertension: Secondary | ICD-10-CM | POA: Diagnosis not present

## 2018-02-06 DIAGNOSIS — M2550 Pain in unspecified joint: Secondary | ICD-10-CM | POA: Diagnosis not present

## 2018-02-06 DIAGNOSIS — Z6825 Body mass index (BMI) 25.0-25.9, adult: Secondary | ICD-10-CM | POA: Diagnosis not present

## 2018-02-06 DIAGNOSIS — M1A09X1 Idiopathic chronic gout, multiple sites, with tophus (tophi): Secondary | ICD-10-CM | POA: Diagnosis not present

## 2018-02-06 DIAGNOSIS — E663 Overweight: Secondary | ICD-10-CM | POA: Diagnosis not present

## 2018-02-11 DIAGNOSIS — I425 Other restrictive cardiomyopathy: Secondary | ICD-10-CM | POA: Diagnosis not present

## 2018-02-11 DIAGNOSIS — J309 Allergic rhinitis, unspecified: Secondary | ICD-10-CM | POA: Diagnosis not present

## 2018-02-11 DIAGNOSIS — Z720 Tobacco use: Secondary | ICD-10-CM | POA: Diagnosis not present

## 2018-02-11 DIAGNOSIS — I429 Cardiomyopathy, unspecified: Secondary | ICD-10-CM | POA: Diagnosis not present

## 2018-02-11 DIAGNOSIS — G629 Polyneuropathy, unspecified: Secondary | ICD-10-CM | POA: Diagnosis not present

## 2018-02-11 DIAGNOSIS — I10 Essential (primary) hypertension: Secondary | ICD-10-CM | POA: Diagnosis not present

## 2018-02-11 DIAGNOSIS — K219 Gastro-esophageal reflux disease without esophagitis: Secondary | ICD-10-CM | POA: Diagnosis not present

## 2018-02-11 DIAGNOSIS — M109 Gout, unspecified: Secondary | ICD-10-CM | POA: Diagnosis not present

## 2018-02-11 DIAGNOSIS — I5020 Unspecified systolic (congestive) heart failure: Secondary | ICD-10-CM | POA: Diagnosis not present

## 2018-03-04 DIAGNOSIS — M2550 Pain in unspecified joint: Secondary | ICD-10-CM | POA: Diagnosis not present

## 2018-03-04 DIAGNOSIS — M1A09X1 Idiopathic chronic gout, multiple sites, with tophus (tophi): Secondary | ICD-10-CM | POA: Diagnosis not present

## 2018-03-04 DIAGNOSIS — E663 Overweight: Secondary | ICD-10-CM | POA: Diagnosis not present

## 2018-03-04 DIAGNOSIS — Z6825 Body mass index (BMI) 25.0-25.9, adult: Secondary | ICD-10-CM | POA: Diagnosis not present

## 2018-03-26 DIAGNOSIS — M1A09X1 Idiopathic chronic gout, multiple sites, with tophus (tophi): Secondary | ICD-10-CM | POA: Diagnosis not present

## 2018-03-26 DIAGNOSIS — Z79899 Other long term (current) drug therapy: Secondary | ICD-10-CM | POA: Diagnosis not present

## 2018-04-17 DIAGNOSIS — M1A09X1 Idiopathic chronic gout, multiple sites, with tophus (tophi): Secondary | ICD-10-CM | POA: Diagnosis not present

## 2018-05-18 ENCOUNTER — Inpatient Hospital Stay (HOSPITAL_COMMUNITY): Payer: Medicare Other

## 2018-05-18 ENCOUNTER — Inpatient Hospital Stay (HOSPITAL_COMMUNITY)
Admission: EM | Admit: 2018-05-18 | Discharge: 2018-05-20 | DRG: 023 | Disposition: A | Discharge status: Home / Self Care | Payer: Medicare Other | Attending: Neurology | Admitting: Neurology

## 2018-05-18 ENCOUNTER — Inpatient Hospital Stay (HOSPITAL_COMMUNITY): Payer: Medicare Other | Admitting: Certified Registered Nurse Anesthetist

## 2018-05-18 ENCOUNTER — Emergency Department (HOSPITAL_COMMUNITY): Payer: Medicare Other

## 2018-05-18 ENCOUNTER — Encounter (HOSPITAL_COMMUNITY): Payer: Self-pay | Admitting: *Deleted

## 2018-05-18 ENCOUNTER — Encounter (HOSPITAL_COMMUNITY)
Admission: EM | Disposition: A | Discharge status: Home / Self Care | Payer: Self-pay | Source: Home / Self Care | Attending: Neurology

## 2018-05-18 DIAGNOSIS — I340 Nonrheumatic mitral (valve) insufficiency: Secondary | ICD-10-CM | POA: Diagnosis not present

## 2018-05-18 DIAGNOSIS — Z8249 Family history of ischemic heart disease and other diseases of the circulatory system: Secondary | ICD-10-CM | POA: Diagnosis not present

## 2018-05-18 DIAGNOSIS — E039 Hypothyroidism, unspecified: Secondary | ICD-10-CM | POA: Diagnosis present

## 2018-05-18 DIAGNOSIS — I639 Cerebral infarction, unspecified: Secondary | ICD-10-CM

## 2018-05-18 DIAGNOSIS — E785 Hyperlipidemia, unspecified: Secondary | ICD-10-CM | POA: Diagnosis present

## 2018-05-18 DIAGNOSIS — Z823 Family history of stroke: Secondary | ICD-10-CM | POA: Diagnosis not present

## 2018-05-18 DIAGNOSIS — R29721 NIHSS score 21: Secondary | ICD-10-CM | POA: Diagnosis present

## 2018-05-18 DIAGNOSIS — M109 Gout, unspecified: Secondary | ICD-10-CM | POA: Diagnosis present

## 2018-05-18 DIAGNOSIS — R4701 Aphasia: Secondary | ICD-10-CM | POA: Diagnosis not present

## 2018-05-18 DIAGNOSIS — J189 Pneumonia, unspecified organism: Secondary | ICD-10-CM | POA: Diagnosis not present

## 2018-05-18 DIAGNOSIS — I491 Atrial premature depolarization: Secondary | ICD-10-CM | POA: Diagnosis not present

## 2018-05-18 DIAGNOSIS — I10 Essential (primary) hypertension: Secondary | ICD-10-CM | POA: Diagnosis present

## 2018-05-18 DIAGNOSIS — J440 Chronic obstructive pulmonary disease with acute lower respiratory infection: Secondary | ICD-10-CM | POA: Diagnosis present

## 2018-05-18 DIAGNOSIS — R4781 Slurred speech: Secondary | ICD-10-CM | POA: Diagnosis not present

## 2018-05-18 DIAGNOSIS — I4892 Unspecified atrial flutter: Secondary | ICD-10-CM | POA: Diagnosis not present

## 2018-05-18 DIAGNOSIS — Z7901 Long term (current) use of anticoagulants: Secondary | ICD-10-CM | POA: Diagnosis not present

## 2018-05-18 DIAGNOSIS — I482 Chronic atrial fibrillation: Secondary | ICD-10-CM | POA: Diagnosis present

## 2018-05-18 DIAGNOSIS — Z720 Tobacco use: Secondary | ICD-10-CM | POA: Diagnosis present

## 2018-05-18 DIAGNOSIS — I110 Hypertensive heart disease with heart failure: Secondary | ICD-10-CM | POA: Diagnosis present

## 2018-05-18 DIAGNOSIS — R29810 Facial weakness: Secondary | ICD-10-CM | POA: Diagnosis not present

## 2018-05-18 DIAGNOSIS — R918 Other nonspecific abnormal finding of lung field: Secondary | ICD-10-CM

## 2018-05-18 DIAGNOSIS — I425 Other restrictive cardiomyopathy: Secondary | ICD-10-CM | POA: Diagnosis not present

## 2018-05-18 DIAGNOSIS — R531 Weakness: Secondary | ICD-10-CM | POA: Diagnosis not present

## 2018-05-18 DIAGNOSIS — Z87891 Personal history of nicotine dependence: Secondary | ICD-10-CM | POA: Diagnosis not present

## 2018-05-18 DIAGNOSIS — H518 Other specified disorders of binocular movement: Secondary | ICD-10-CM | POA: Diagnosis present

## 2018-05-18 DIAGNOSIS — Z8673 Personal history of transient ischemic attack (TIA), and cerebral infarction without residual deficits: Secondary | ICD-10-CM

## 2018-05-18 DIAGNOSIS — I509 Heart failure, unspecified: Secondary | ICD-10-CM | POA: Diagnosis not present

## 2018-05-18 DIAGNOSIS — I63411 Cerebral infarction due to embolism of right middle cerebral artery: Principal | ICD-10-CM | POA: Diagnosis present

## 2018-05-18 DIAGNOSIS — I6601 Occlusion and stenosis of right middle cerebral artery: Secondary | ICD-10-CM | POA: Diagnosis present

## 2018-05-18 DIAGNOSIS — Z79899 Other long term (current) drug therapy: Secondary | ICD-10-CM | POA: Diagnosis not present

## 2018-05-18 DIAGNOSIS — R05 Cough: Secondary | ICD-10-CM | POA: Diagnosis not present

## 2018-05-18 DIAGNOSIS — Z886 Allergy status to analgesic agent status: Secondary | ICD-10-CM

## 2018-05-18 DIAGNOSIS — F1290 Cannabis use, unspecified, uncomplicated: Secondary | ICD-10-CM | POA: Diagnosis present

## 2018-05-18 DIAGNOSIS — F1011 Alcohol abuse, in remission: Secondary | ICD-10-CM | POA: Diagnosis present

## 2018-05-18 DIAGNOSIS — R29818 Other symptoms and signs involving the nervous system: Secondary | ICD-10-CM | POA: Diagnosis not present

## 2018-05-18 DIAGNOSIS — I4891 Unspecified atrial fibrillation: Secondary | ICD-10-CM | POA: Diagnosis not present

## 2018-05-18 DIAGNOSIS — R001 Bradycardia, unspecified: Secondary | ICD-10-CM | POA: Diagnosis not present

## 2018-05-18 HISTORY — PX: IR ANGIO VERTEBRAL SEL SUBCLAVIAN INNOMINATE UNI R MOD SED: IMG5365

## 2018-05-18 HISTORY — PX: IR CT HEAD LTD: IMG2386

## 2018-05-18 HISTORY — PX: RADIOLOGY WITH ANESTHESIA: SHX6223

## 2018-05-18 HISTORY — PX: IR PERCUTANEOUS ART THROMBECTOMY/INFUSION INTRACRANIAL INC DIAG ANGIO: IMG6087

## 2018-05-18 LAB — ETHANOL: Alcohol, Ethyl (B): 43 mg/dL — ABNORMAL HIGH (ref <10)

## 2018-05-18 LAB — I-STAT CHEM 8, ED
BUN: 10 mg/dL (ref 6–20)
Calcium, Ion: 0.98 mmol/L — ABNORMAL LOW (ref 1.15–1.40)
Chloride: 101 mmol/L (ref 98–111)
Creatinine, Ser: 0.9 mg/dL (ref 0.61–1.24)
Glucose, Bld: 101 mg/dL — ABNORMAL HIGH (ref 70–99)
HCT: 54 % — ABNORMAL HIGH (ref 39.0–52.0)
Hemoglobin: 18.4 g/dL — ABNORMAL HIGH (ref 13.0–17.0)
Potassium: 4.5 mmol/L (ref 3.5–5.1)
Sodium: 137 mmol/L (ref 135–145)
TCO2: 23 mmol/L (ref 22–32)

## 2018-05-18 LAB — COMPREHENSIVE METABOLIC PANEL
ALT: 28 U/L (ref 0–44)
AST: 48 U/L — ABNORMAL HIGH (ref 15–41)
Albumin: 3.2 g/dL — ABNORMAL LOW (ref 3.5–5.0)
Alkaline Phosphatase: 202 U/L — ABNORMAL HIGH (ref 38–126)
Anion gap: 15 (ref 5–15)
BUN: 7 mg/dL (ref 6–20)
CO2: 20 mmol/L — ABNORMAL LOW (ref 22–32)
Calcium: 8.8 mg/dL — ABNORMAL LOW (ref 8.9–10.3)
Chloride: 101 mmol/L (ref 98–111)
Creatinine, Ser: 1.04 mg/dL (ref 0.61–1.24)
GFR calc Af Amer: >60 mL/min (ref >60)
GFR calc non Af Amer: >60 mL/min (ref >60)
Glucose, Bld: 103 mg/dL — ABNORMAL HIGH (ref 70–99)
Potassium: 4.6 mmol/L (ref 3.5–5.1)
Sodium: 136 mmol/L (ref 135–145)
Total Bilirubin: 2.5 mg/dL — ABNORMAL HIGH (ref 0.3–1.2)
Total Protein: 7 g/dL (ref 6.5–8.1)

## 2018-05-18 LAB — RAPID URINE DRUG SCREEN, HOSP PERFORMED
Amphetamines: NOT DETECTED
Barbiturates: NOT DETECTED
Benzodiazepines: NOT DETECTED
Cocaine: NOT DETECTED
Opiates: NOT DETECTED
Tetrahydrocannabinol: POSITIVE — AB

## 2018-05-18 LAB — URINALYSIS, ROUTINE W REFLEX MICROSCOPIC
Bilirubin Urine: NEGATIVE
Glucose, UA: NEGATIVE mg/dL
Ketones, ur: NEGATIVE mg/dL
Leukocytes, UA: NEGATIVE
Nitrite: NEGATIVE
Protein, ur: NEGATIVE mg/dL
Specific Gravity, Urine: 1.011 (ref 1.005–1.030)
pH: 5 (ref 5.0–8.0)

## 2018-05-18 LAB — DIFFERENTIAL
Abs Immature Granulocytes: 0 10*3/uL (ref 0.0–0.1)
Basophils Absolute: 0 10*3/uL (ref 0.0–0.1)
Basophils Relative: 1 %
Eosinophils Absolute: 0 10*3/uL (ref 0.0–0.7)
Eosinophils Relative: 0 %
Immature Granulocytes: 0 %
Lymphocytes Relative: 49 %
Lymphs Abs: 2.7 10*3/uL (ref 0.7–4.0)
Monocytes Absolute: 0.7 10*3/uL (ref 0.1–1.0)
Monocytes Relative: 12 %
Neutro Abs: 2.1 10*3/uL (ref 1.7–7.7)
Neutrophils Relative %: 38 %

## 2018-05-18 LAB — CBC
HCT: 50.4 % (ref 39.0–52.0)
Hemoglobin: 16.3 g/dL (ref 13.0–17.0)
MCH: 29.5 pg (ref 26.0–34.0)
MCHC: 32.3 g/dL (ref 30.0–36.0)
MCV: 91.3 fL (ref 78.0–100.0)
Platelets: 315 10*3/uL (ref 150–400)
RBC: 5.52 MIL/uL (ref 4.22–5.81)
RDW: 15.3 % (ref 11.5–15.5)
WBC: 5.5 10*3/uL (ref 4.0–10.5)

## 2018-05-18 LAB — I-STAT TROPONIN, ED: Troponin i, poc: 0.03 ng/mL (ref 0.00–0.08)

## 2018-05-18 LAB — PROTIME-INR
INR: 1.31
Prothrombin Time: 16.1 seconds — ABNORMAL HIGH (ref 11.4–15.2)

## 2018-05-18 LAB — APTT: aPTT: 27 seconds (ref 24–36)

## 2018-05-18 SURGERY — RADIOLOGY WITH ANESTHESIA
Anesthesia: General

## 2018-05-18 MED ORDER — PHENYLEPHRINE HCL 10 MG/ML IJ SOLN
INTRAMUSCULAR | Status: DC | PRN
  Administered 2018-05-18 (×3): 120 ug via INTRAVENOUS

## 2018-05-18 MED ORDER — ACETAMINOPHEN 325 MG PO TABS: 650.0000 mg | ORAL_TABLET | ORAL | Status: DC | PRN

## 2018-05-18 MED ORDER — ACETAMINOPHEN 650 MG RE SUPP: 650.0000 mg | RECTAL | Status: DC | PRN

## 2018-05-18 MED ORDER — ONDANSETRON HCL 4 MG/2ML IJ SOLN: 4.0000 mg | Freq: Four times a day (QID) | INTRAMUSCULAR | Status: DC | PRN

## 2018-05-18 MED ORDER — IOPAMIDOL (ISOVUE-370) INJECTION 76%
50.0000 mL | Freq: Once | INTRAVENOUS | Status: AC | PRN
  Administered 2018-05-18: 50 mL via INTRAVENOUS

## 2018-05-18 MED ORDER — ROCURONIUM BROMIDE 100 MG/10ML IV SOLN
INTRAVENOUS | Status: DC | PRN
  Administered 2018-05-18: 50 mg via INTRAVENOUS

## 2018-05-18 MED ORDER — SODIUM CHLORIDE 0.9 % IV SOLN
INTRAVENOUS | Status: DC | PRN
  Administered 2018-05-18: 50 ug/min via INTRAVENOUS

## 2018-05-18 MED ORDER — ACETAMINOPHEN 160 MG/5ML PO SOLN: 650.0000 mg | ORAL | Status: DC | PRN

## 2018-05-18 MED ORDER — OXYCODONE HCL 5 MG/5ML PO SOLN: 5.0000 mg | Freq: Once | ORAL | Status: DC | PRN

## 2018-05-18 MED ORDER — FENTANYL CITRATE (PF) 100 MCG/2ML IJ SOLN: 25.0000 ug | INTRAMUSCULAR | Status: DC | PRN

## 2018-05-18 MED ORDER — LIDOCAINE HCL (CARDIAC) PF 100 MG/5ML IV SOSY
PREFILLED_SYRINGE | INTRAVENOUS | Status: DC | PRN
  Administered 2018-05-18: 60 mg via INTRAVENOUS

## 2018-05-18 MED ORDER — IOHEXOL 300 MG/ML  SOLN: 100.0000 mL | Freq: Once | INTRAMUSCULAR | Status: DC | PRN

## 2018-05-18 MED ORDER — EPTIFIBATIDE 20 MG/10ML IV SOLN
INTRAVENOUS | Status: AC
  Filled 2018-05-18: qty 10

## 2018-05-18 MED ORDER — PROPOFOL 10 MG/ML IV BOLUS
INTRAVENOUS | Status: DC | PRN
  Administered 2018-05-18: 100 mg via INTRAVENOUS

## 2018-05-18 MED ORDER — NITROGLYCERIN 1 MG/10 ML FOR IR/CATH LAB
INTRA_ARTERIAL | Status: AC
  Administered 2018-05-18 (×2): 25 ug via INTRA_ARTERIAL
  Administered 2018-05-18: 25 ug
  Administered 2018-05-18 (×2): 25 ug via INTRA_ARTERIAL
  Filled 2018-05-18: qty 10

## 2018-05-18 MED ORDER — SODIUM CHLORIDE 0.9 % IV SOLN
INTRAVENOUS | Status: DC
  Administered 2018-05-18: 18:00:00 via INTRAVENOUS

## 2018-05-18 MED ORDER — TICAGRELOR 90 MG PO TABS
ORAL_TABLET | ORAL | Status: AC
  Filled 2018-05-18: qty 2

## 2018-05-18 MED ORDER — CLOPIDOGREL BISULFATE 300 MG PO TABS
ORAL_TABLET | ORAL | Status: AC
  Filled 2018-05-18: qty 1

## 2018-05-18 MED ORDER — SODIUM CHLORIDE 0.9 % IV SOLN
INTRAVENOUS | Status: DC
  Administered 2018-05-18 – 2018-05-19 (×2): via INTRAVENOUS

## 2018-05-18 MED ORDER — ONDANSETRON HCL 4 MG/2ML IJ SOLN
INTRAMUSCULAR | Status: DC | PRN
  Administered 2018-05-18: 4 mg via INTRAVENOUS

## 2018-05-18 MED ORDER — SUCCINYLCHOLINE CHLORIDE 20 MG/ML IJ SOLN
INTRAMUSCULAR | Status: DC | PRN
  Administered 2018-05-18: 100 mg via INTRAVENOUS

## 2018-05-18 MED ORDER — SUGAMMADEX SODIUM 500 MG/5ML IV SOLN
INTRAVENOUS | Status: DC | PRN
  Administered 2018-05-18: 300 mg via INTRAVENOUS

## 2018-05-18 MED ORDER — OXYCODONE HCL 5 MG PO TABS: 5.0000 mg | ORAL_TABLET | Freq: Once | ORAL | Status: DC | PRN

## 2018-05-18 MED ORDER — CEFAZOLIN SODIUM-DEXTROSE 2-4 GM/100ML-% IV SOLN
INTRAVENOUS | Status: AC
  Filled 2018-05-18: qty 100

## 2018-05-18 MED ORDER — FENTANYL CITRATE (PF) 100 MCG/2ML IJ SOLN
INTRAMUSCULAR | Status: AC
  Filled 2018-05-18: qty 2

## 2018-05-18 MED ORDER — NICARDIPINE HCL IN NACL 40-0.83 MG/200ML-% IV SOLN
0.0000 mg/h | INTRAVENOUS | Status: DC
  Filled 2018-05-18: qty 200

## 2018-05-18 MED ORDER — STROKE: EARLY STAGES OF RECOVERY BOOK
Freq: Once | Status: AC
  Administered 2018-05-19: 06:00:00
  Filled 2018-05-18 (×2): qty 1

## 2018-05-18 MED ORDER — SENNOSIDES-DOCUSATE SODIUM 8.6-50 MG PO TABS: 1.0000 | ORAL_TABLET | Freq: Every evening | ORAL | Status: DC | PRN

## 2018-05-18 MED ORDER — EPHEDRINE SULFATE 50 MG/ML IJ SOLN
INTRAMUSCULAR | Status: DC | PRN
  Administered 2018-05-18 (×4): 5 mg via INTRAVENOUS

## 2018-05-18 MED ORDER — IOHEXOL 300 MG/ML  SOLN: 50.0000 mL | Freq: Once | INTRAMUSCULAR | Status: DC | PRN

## 2018-05-18 MED ORDER — ASPIRIN 325 MG PO TABS
ORAL_TABLET | ORAL | Status: AC
  Filled 2018-05-18: qty 1

## 2018-05-18 MED ORDER — CEFAZOLIN SODIUM-DEXTROSE 2-3 GM-%(50ML) IV SOLR
INTRAVENOUS | Status: DC | PRN
  Administered 2018-05-18: 2 g via INTRAVENOUS

## 2018-05-18 MED ORDER — TIROFIBAN HCL IN NACL 5-0.9 MG/100ML-% IV SOLN
INTRAVENOUS | Status: AC
  Filled 2018-05-18: qty 100

## 2018-05-18 NOTE — Anesthesia Procedure Notes (Addendum)
Procedure Name: Intubation Date/Time: 05/18/2018 5:56 PM Performed by: Babs Bertin, CRNA Pre-anesthesia Checklist: Patient identified, Emergency Drugs available, Suction available and Patient being monitored Patient Re-evaluated:Patient Re-evaluated prior to induction Oxygen Delivery Method: Circle system utilized Preoxygenation: Pre-oxygenation with 100% oxygen Induction Type: IV induction, Rapid sequence and Cricoid Pressure applied Laryngoscope Size: Mac and 3 Grade View: Grade I Tube type: Subglottic suction tube Tube size: 7.5 mm Number of attempts: 1 Airway Equipment and Method: Stylet Placement Confirmation: ETT inserted through vocal cords under direct vision,  positive ETCO2 and breath sounds checked- equal and bilateral Secured at: 23 cm Tube secured with: Tape Dental Injury: Teeth and Oropharynx as per pre-operative assessment

## 2018-05-18 NOTE — Anesthesia Preprocedure Evaluation (Addendum)
Anesthesia Evaluation  Patient identified by MRN, date of birth, ID band Patient confused    Reviewed: Unable to perform ROS - Chart review onlyPreop documentation limited or incomplete due to emergent nature of procedure.  Airway   TM Distance: >3 FB    Comment: uncooperative Dental  (+) Missing, Poor Dentition   Pulmonary former smoker,    breath sounds clear to auscultation       Cardiovascular hypertension, Pt. on medications +CHF  + dysrhythmias Atrial Fibrillation  Rhythm:Irregular Rate:Normal     Neuro/Psych CVA    GI/Hepatic   Endo/Other    Renal/GU      Musculoskeletal  (+) Arthritis ,   Abdominal   Peds  Hematology   Anesthesia Other Findings   Reproductive/Obstetrics                            Anesthesia Physical Anesthesia Plan  ASA: IV and emergent  Anesthesia Plan: General   Post-op Pain Management:    Induction: Intravenous  PONV Risk Score and Plan: 2 and Ondansetron, Dexamethasone and Treatment may vary due to age or medical condition  Airway Management Planned: Oral ETT  Additional Equipment: Arterial line  Intra-op Plan:   Post-operative Plan: Possible Post-op intubation/ventilation  Informed Consent: I have reviewed the patients History and Physical, chart, labs and discussed the procedure including the risks, benefits and alternatives for the proposed anesthesia with the patient or authorized representative who has indicated his/her understanding and acceptance.   History available from chart only and Only emergency history available  Plan Discussed with: Anesthesiologist, Surgeon and CRNA  Anesthesia Plan Comments:        Anesthesia Quick Evaluation

## 2018-05-18 NOTE — Procedures (Signed)
S/P RT common carotid arteriogram followed by endovascular complete revascularization of occluded RT MCA M 1 seg  With x 1 pass with 36mm x 64mm embotrap retrieve device achieving a TICI 3  Reperfusion.

## 2018-05-18 NOTE — Code Documentation (Signed)
Patient was last known well by wife at 53 when she came back upstairs he was not responding and not moving his left side.  EMS called code stroke enroute at 1635  Patient arrived at 1648  Stat labs and head CT done.  Dr Rory Percy met patient upon arrival.  Per patient he took his eliquis at 2pm this afternoon therefore decision was made not to give TPA.  CTA head and neck done.   NIHSS improving  Upon arrival NIHSS 21, post head CT NIHSS 17  And when transferred to IR table NIHSS 10.  2 PIV placed, 20 ga right arm, 18 ga left arm.  Foley placed, pedal pulses confirmed by doppler and marked.  Hand off with IR team.  Plan admit to neuro ICU

## 2018-05-18 NOTE — Progress Notes (Signed)
Patient ID: Charles Hawkins, male   DOB: 1971-02-28, 47 y.o.   MRN: [id number] INR.  47 Y RH male LSW 4.05 pm .Sudden onset of Lt sided weakness ,RT gaze deviation   And dysarthria.   Premorbid Modified rankin score  1. CT brain No ICH. ASPECTS 9. CTA RT MCA M1 occlusion. Endovascular revascularization of RT MCA discussed with spouse. Procedure,reasons ,risks  and alternatives discussed with spouse and sister.Risks of ICH 10 % ,worsening neuro deficit,vent dependency ,death,inability to revascularize and vascular injury discussed with family. .Questions answered to their understanding and satisfaction. Informed witnessed consent obtained for endovascular revascularization under [place]. S.Verla Bryngelson MD
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
## 2018-05-18 NOTE — H&P (Signed)
Neurology Consultation  CC: Right gaze, left sided weakness and neglect  History is obtained from:Chart, patient's wife  HPI: Charles Hawkins is a 47 y.o. 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 No preceding illnesses have been reported.  He has not had a stroke in his life before He took his last dose of Eliquis at noon today.  Reports compliance to all his medications. He follows with multiple doctors at [facility], [facility] and [facility] system. His wife and sister arrived at bedside and provided and confirmed the history that was initially provided by EMS. He was taken for a Noncon CT of the head that showed a dense MCA sign and aspect score of 9.  CT angiogram head and neck showed a right M1 cutoff. An endovascular consultation was obtained emergently and decision was made to take him for emergent endovascular thrombectomy.  He is not a candidate for TPA due to last dose of Eliquis being taken just a few hours prior to presentation.   LKW: 1605 hrs 05/18/18 tpa given?: no, on Eliquis Premorbid modified Rankin scale (mRS): 1  ROS: ROS was performed and is negative except as noted in the HPI.   Past Medical History:  Diagnosis Date  . Arthritis   . CHF (congestive heart failure) ([place])   . HTN (hypertension)   . 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 Multiple areas of subendocardial enhancement on MRI. Fat pad and myocardial biopsy negative at [facility].     Family History  Problem Relation Age of Onset  . Heart failure Son 1       s/p heart transplant  . Stroke Brother 56       Deceased  . Stroke Mother   . Hypertension Father    Social History:   reports that he has quit  smoking. His smoking use included cigarettes. He smoked 0.25 packs per day. He does not have any smokeless tobacco history on file. He reports that he drinks alcohol. He reports that he has current or past drug history.  Medications  Current Facility-Administered Medications:  .  iohexol (OMNIPAQUE) 300 MG/ML solution 50 mL, 50 mL, Intravenous, Once PRN, Amie Portland, MD  Current Outpatient Medications:  .  amiodarone (PACERONE) 100 MG tablet, Take 1 tablet (100 mg total) by mouth daily., Disp: 30 tablet, Rfl: 6 .  amiodarone (PACERONE) 200 MG tablet, TAKE 1 TABLET (200 MG TOTAL) BY MOUTH 2 (TWO) TIMES DAILY., Disp: 60 tablet, Rfl: 3 .  buPROPion (WELLBUTRIN SR) 150 MG 12 hr tablet, Take 1 tablet (150 mg total) by mouth 2 (two) times daily., Disp: 60 tablet, Rfl: 3 .  carvedilol (COREG) 6.25 MG tablet, TAKE 1 TABLET BY MOUTH TWICE A DAY, Disp: 60 tablet, Rfl: 3 .  colchicine 0.6 MG tablet, Take 1 tablet (0.6 mg total) by mouth daily. 1-2 times daily, Disp: 14 tablet, Rfl: 0 .  ELIQUIS 5 MG TABS tablet, TAKE 1 TABLET (5 MG TOTAL) BY MOUTH 2 (TWO) TIMES DAILY., Disp: 60 tablet, Rfl: 5 .  furosemide (LASIX) 40 MG tablet, Take 40 mg (1 tablet) in the morningTake 20 mg ( 1/2 tablet) in the evening, Disp: 135 tablet, Rfl: 3 .  Multiple Vitamin (  MULTIVITAMIN) tablet, Take 1 tablet by mouth daily., Disp: , Rfl:  .  nicotine (NICODERM CQ - DOSED IN MG/24 HR) 7 mg/24hr patch, PLACE 1 PATCH (7 MG TOTAL) ONTO THE SKIN DAILY. (Patient not taking: Reported on 10/17/2016), Disp: 28 patch, Rfl: 0 .  potassium chloride (MICRO-K) 10 MEQ CR capsule, TAKE 2 TABLETS (20 MEQ TOTAL) BY MOUTH DAILY., Disp: 60 capsule, Rfl: 3 .  pregabalin (LYRICA) 75 MG capsule, Take 75 mg by mouth every other day. , Disp: , Rfl:  .  sacubitril-valsartan (ENTRESTO) 24-26 MG, Take 1 tablet by mouth 2 (two) times daily., Disp: 60 tablet, Rfl: 3  Exam: Current vital signs: There were no vitals taken for this visit. Vital signs in last  24 hours:   GENERAL: Awake, alert in NAD HEENT: - Normocephalic and atraumatic, dry mm, no LN++, no Thyromegally LUNGS - Clear to auscultation bilaterally with no wheezes CV - S1S2 RRR, no m/r/g, equal pulses bilaterally. ABDOMEN - Soft, nontender, nondistended with normoactive BS Ext: warm, well perfused, intact peripheral pulses, no edema  NEURO:  Mental Status: AA&Ox3  Language: speech is severe dysartrhic.  Naming, repetition, fluency, and comprehension intact. Cranial Nerves: PERRL. EOM exam shows forced right gaze, left homonymous hemianopsia, , left facial weakness, sternocleidomastoid and trapezius muscle strength. No evidence of tongue atrophy or fibrillations Motor: 1/5 LUE, 4/5 LLE, 5/5 RUE, 5/5 RLE Tone: is normal and bulk is normal Sensation- severely diminished to LT on left. Neglects left side strongly. Has extinction - visual and tactile. Coordination: FTN intact right, unable to perform on left due to weakness Gait- deferred  NIHSS - 16 Labs I have reviewed labs in epic and the results pertinent to this consultation are: CBC    Component Value Date/Time   WBC 5.5 05/18/2018 1715   RBC 5.52 05/18/2018 1715   HGB 16.3 05/18/2018 1715   HCT 50.4 05/18/2018 1715   PLT 315 05/18/2018 1715   MCV 91.3 05/18/2018 1715   MCH 29.5 05/18/2018 1715   MCHC 32.3 05/18/2018 1715   RDW 15.3 05/18/2018 1715   LYMPHSABS 2.7 05/18/2018 1715   MONOABS 0.7 05/18/2018 1715   EOSABS 0.0 05/18/2018 1715   BASOSABS 0.0 05/18/2018 1715    CMP     Component Value Date/Time   NA 137 05/18/2018 1703   K 4.5 05/18/2018 1703   CL 101 05/18/2018 1703   CO2 22 12/01/2017 1311   GLUCOSE 101 (H) 05/18/2018 1703   BUN 10 05/18/2018 1703   CREATININE 0.90 05/18/2018 1703   CALCIUM 9.0 12/01/2017 1311   PROT 7.9 12/01/2017 1311   ALBUMIN 3.6 12/01/2017 1311   AST 50 (H) 12/01/2017 1311   ALT 30 12/01/2017 1311   ALKPHOS 195 (H) 12/01/2017 1311   BILITOT 2.8 (H) 12/01/2017 1311    GFRNONAA >60 12/01/2017 1311   GFRAA >60 12/01/2017 1311   Imaging I have reviewed the images obtained: CT-scan of the brain- ASPECTS 9 for RMCA territory stroke. Dense RMCA. CTA head neck: right distal M1 occlusion.  Assessment:  47 year old man with restrictive cardiomyopathy presenting with clinical signs of right MCA syndrome. CT head showed no bleed.  Aspect score 9.  CT angios head and neck showed right distal M1 occlusion. Last seen normal 4:05 PM today on the day of presentation.  He is a candidate for endovascular thrombectomy. Dr. Estanislado Pandy was contacted and IR team was paged. He will be undergoing interventional radiology guided endovascular thrombectomy and will be admitted to the neurological  ICU under the stroke service thereafter.  Impression: Acute ischemic stroke - likely cardioembolic from the cardiomyopathy   PLAN: Acute Ischemic Stroke Cerebral infarction due to embolism of left middle cerebral artery  Acuity: Acute Current Suspected Etiology: cardioembolic Continue Evaluation:  -Admit to: -Continue Aspirin/ Statin -Continue Statin -Hold Aspirin until 24 hour post tPA neuroimaging is stable and without evidence of bleeding -Blood pressure control, goal of SYS <140 if recanalized.  If the endovascular thrombectomy feels to recanalize, his systolic blood pressures should be allowed to rise high for permissive hypertension but given the fact that he is on Eliquis and has a right MCA stroke, I would keep the upper limit of his systolics less than 292 t reduce risk of hemorrhagic transformationo. -MRI/ECHO/A1C/Lipid panel. -Hyperglycemia management per SSI to maintain glucose 140-180mg /dL. -PT/OT/ST therapies and recommendations when able  CNS -Close neuro monitoring  Dysarthria Dysphagia following cerebral infarction  -NPO until cleared by speech -ST -Advance diet as tolerated  Hemiplegia and hemiparesis following cerebral infarction affecting left  non-dominant side  -PT/OT  RESP Will be Acute Respiratory Failure  -vent management per ICU -wean when able  CV Hypertensive encephalopathy Essential (primary) hypertension Hypertensive Emergency Hypertensive Urgency -Aggressive BP control - as above  Heart failure, unspecified Restrictive cardiomyopathy. -Cardiology consult-on call paged. Spoke with Dr. Jamelle Haring who will see the patient once out of IR. -TTE  Hyperlipidemia, unspecified  - Statin for goal LDL < 70  A. Flutter -On eliquis at home. -Hold for now. -May need to resume [facility] soon if LV thrombus.  HEME -Monitor -transfuse for hgb < 7  ENDO -goal HgbA1c < 7 -SSI  GI/GU -Gentle hydration  Fluid/Electrolyte Disorders -Replete -Repeat labs  ID Possible Aspiration PNA -CXR -NPO -Monitor  Prophylaxis DVT: SCDs for now   GI: NA Bowel: doc senna   Diet: NPO until cleared by speech  Code Status: Full Code  THE FOLLOWING WERE PRESENT ON ADMISSION: CNS -  AIS, hemiplegia ID: possible aspiration pna CVS: cardiomyopathy, a.flutter  -- Amie Portland, MD [facility] Pager: [phone] If 7pm to 7am, please call on call as listed on AMION.  CRITICAL CARE ATTESTATION This patient is critically ill and at significant risk of neurological worsening, death and care requires constant monitoring of vital signs, hemodynamics,respiratory and cardiac monitoring. I spent 55  minutes of neurocritical care time performing neurological assessment, discussion with family, other specialists and medical decision making of high complexityin the care of  this patient.

## 2018-05-18 NOTE — ED Triage Notes (Signed)
Pt here from home via [facility] as a code stroke.  Wife walked downstairs and husband was normal (lsn 1605) and when she came upstairs her husband was not responding, R sided gaze with L sided flacidity.  CBG 89.  VS stable.

## 2018-05-18 NOTE — Transfer of Care (Signed)
Immediate Anesthesia Transfer of Care Note  Patient: [facility]  Procedure(s) Performed: RADIOLOGY WITH ANESTHESIA (N/A )  Patient Location: PACU  Anesthesia Type:General  Level of Consciousness: awake, alert  and oriented  Airway & Oxygen Therapy: Patient Spontanous Breathing and Patient connected to nasal cannula oxygen  Post-op Assessment: Report given to RN, Post -op Vital signs reviewed and stable and Patient moving all extremities X 4  Post vital signs: Reviewed and stable  Last Vitals:  Vitals Value Taken Time  BP 111/83 05/18/2018  8:11 PM  Temp    Pulse    Resp 22 05/18/2018  8:11 PM  SpO2    Vitals shown include unvalidated device data.  Last Pain: There were no vitals filed for this visit.       Complications: No apparent anesthesia complications

## 2018-05-18 NOTE — ED Provider Notes (Signed)
[facility] EMERGENCY DEPARTMENT Provider Note   CSN: [id number] Arrival date & time: 05/18/18  1701     History   Chief Complaint Chief Complaint  Patient presents with  . Code Stroke    HPI Charles Hawkins is a 47 y.o. male.  47 yo M with a chief complaint of sudden onset difficulty with speech and left-sided weakness.  Patient arrived as a code stroke I saw the patient at the bridge and he was taken urgently back to CT.  Limited history due to urgency of illness.  Level 5 caveat  The history is provided by the patient.  Illness  This is a new problem. The current episode started less than 1 hour ago. The problem occurs constantly. The problem has not changed since onset.Pertinent negatives include no chest pain, no abdominal pain, no headaches and no shortness of breath. Nothing aggravates the symptoms. Nothing relieves the symptoms. He has tried nothing for the symptoms. The treatment provided no relief.    Past Medical History:  Diagnosis Date  . Arthritis   . CHF (congestive heart failure) ([place])   . HTN (hypertension)   . 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 Multiple areas of subendocardial enhancement on MRI. Fat pad and myocardial biopsy negative at [facility].     Patient Active Problem List   Diagnosis Date Noted  . Atrial flutter ([place]) 12/06/2014  . Chronic diastolic heart failure ([place]) 03/03/2014  . Palpitations 03/03/2014  . Acute gout 02/06/2014  . Infiltrative cardiomyopathy ([place]) 02/06/2014    No past surgical history on file.      Home Medications    Prior to Admission medications   Medication Sig Start Date End Date Taking? Authorizing Provider  amiodarone (PACERONE) 100 MG tablet Take 1 tablet (100 mg total) by mouth daily. 03/22/15   Larey Dresser, MD  amiodarone (PACERONE) 200 MG tablet TAKE 1 TABLET (200 MG TOTAL) BY MOUTH 2 (TWO) TIMES DAILY. 05/17/15   Bensimhon, Shaune Pascal, MD  buPROPion (WELLBUTRIN SR) 150 MG 12 hr  tablet Take 1 tablet (150 mg total) by mouth 2 (two) times daily. 04/21/15   Bensimhon, Shaune Pascal, MD  carvedilol (COREG) 6.25 MG tablet TAKE 1 TABLET BY MOUTH TWICE A DAY 06/20/15   Bensimhon, Shaune Pascal, MD  colchicine 0.6 MG tablet Take 1 tablet (0.6 mg total) by mouth daily. 1-2 times daily 12/01/17   Recardo Evangelist, PA-C  ELIQUIS 5 MG TABS tablet TAKE 1 TABLET (5 MG TOTAL) BY MOUTH 2 (TWO) TIMES DAILY. 11/16/16   Bensimhon, Shaune Pascal, MD  furosemide (LASIX) 40 MG tablet Take 40 mg (1 tablet) in the morningTake 20 mg ( 1/2 tablet) in the evening 08/22/15   Larey Dresser, MD  Multiple Vitamin (MULTIVITAMIN) tablet Take 1 tablet by mouth daily.    [provider]  nicotine (NICODERM CQ - DOSED IN MG/24 HR) 7 mg/24hr patch PLACE 1 PATCH (7 MG TOTAL) ONTO THE SKIN DAILY. Patient not taking: Reported on 10/17/2016 05/03/15   Larey Dresser, MD  potassium chloride (MICRO-K) 10 MEQ CR capsule TAKE 2 TABLETS (20 MEQ TOTAL) BY MOUTH DAILY. 03/11/17   Larey Dresser, MD  pregabalin (LYRICA) 75 MG capsule Take 75 mg by mouth every other day.     [provider]  sacubitril-valsartan (ENTRESTO) 24-26 MG Take 1 tablet by mouth 2 (two) times daily. 04/21/15   Shirley Friar, PA-C    Family History Family History  Problem  Relation Age of Onset  . Heart failure Son 1       s/p heart transplant  . Stroke Brother 49       Deceased  . Stroke Mother   . Hypertension Father     Social History Social History   Tobacco Use  . Smoking status: Former Smoker    Packs/day: 0.25    Types: Cigarettes  Substance Use Topics  . Alcohol use: Yes    Comment: occasionally  . Drug use: Yes    Comment: marijuana occasionally     Allergies   Nsaids   Review of Systems Review of Systems  Constitutional: Negative for chills and fever.  HENT: Negative for congestion and facial swelling.   Eyes: Negative for discharge and visual disturbance.  Respiratory: Negative for shortness of  breath.   Cardiovascular: Negative for chest pain and palpitations.  Gastrointestinal: Negative for abdominal pain, diarrhea and vomiting.  Musculoskeletal: Negative for arthralgias and myalgias.  Skin: Negative for color change and rash.  Neurological: Positive for speech difficulty and weakness. Negative for tremors, syncope and headaches.  Psychiatric/Behavioral: Negative for confusion and dysphoric mood.     Physical Exam Updated Vital Signs There were no vitals taken for this visit.  Physical Exam  Constitutional: He appears well-developed and well-nourished.  HENT:  Head: Normocephalic and atraumatic.  Eyes: Pupils are equal, round, and reactive to light. EOM are normal.  Neck: Normal range of motion. Neck supple. No JVD present.  Cardiovascular: Normal rate and regular rhythm. Exam reveals no gallop and no friction rub.  No murmur heard. Pulmonary/Chest: No respiratory distress. He has no wheezes.  Abdominal: He exhibits no distension. There is no rebound and no guarding.  Musculoskeletal: Normal range of motion.  Neurological: He is alert.  Left-sided neglect, left-sided paralysis  Skin: No rash noted. No pallor.  Psychiatric: He has a normal mood and affect. His behavior is normal.  Nursing note and vitals reviewed.    ED Treatments / Results  Labs (all labs ordered are listed, but only abnormal results are displayed) Labs Reviewed  PROTIME-INR - Abnormal; Notable for the following components:      Result Value   Prothrombin Time 16.1 (*)    All other components within normal limits  I-STAT CHEM 8, ED - Abnormal; Notable for the following components:   Glucose, Bld 101 (*)    Calcium, Ion 0.98 (*)    Hemoglobin 18.4 (*)    HCT 54.0 (*)    All other components within normal limits  APTT  CBC  DIFFERENTIAL  ETHANOL  COMPREHENSIVE METABOLIC PANEL  RAPID URINE DRUG SCREEN, HOSP PERFORMED  URINALYSIS, ROUTINE W REFLEX MICROSCOPIC  I-STAT TROPONIN, ED     EKG None  Radiology Ct Head Code Stroke Wo Contrast`  Result Date: 05/18/2018 CLINICAL DATA:  Code stroke.  LEFT-sided weakness.  Aphasia. EXAM: CT HEAD WITHOUT CONTRAST TECHNIQUE: Contiguous axial images were obtained from the base of the skull through the vertex without intravenous contrast. COMPARISON:  None. FINDINGS: The patient was unable to remain motionless for the exam. Small or subtle lesions could be overlooked. Brain: Slight hypoattenuation of the RIGHT insular ribbon as compared to the LEFT, could represent early ischemia. No hemorrhage, mass lesion, hydrocephalus, or extra-axial fluid. Normal for age cerebral volume. No definite white matter disease. Vascular: There is Hounsfield artifact and mild motion which obscure the slices through the circle of Willis. However, the RIGHT MCA is questionably more dense than the LEFT, as  seen on coronal series 5 image 25 and sagittal series 6, image 25. Skull: Normal. Negative for fracture or focal lesion. Sinuses/Orbits: No acute finding. Other: None. ASPECTS [facility] Stroke Program Early CT Score) - Ganglionic level infarction (caudate, lentiform nuclei, internal capsule, insula, M1-M3 cortex): 6 - Supraganglionic infarction (M4-M6 cortex): 3 Total score (0-10 with 10 being normal): 9 IMPRESSION: 1. Somewhat reduced quality exam demonstrating equivocal hypoattenuation of the RIGHT insular ribbon is compared to the LEFT. RIGHT MCA may be questionably more dense than LEFT, on multiplanar reformations. 2. ASPECTS is 9. These results were communicated to Dr. Rory Percy at 5:21 pmon 7/21/2019by text page via the [facility] messaging system. Electronically Signed   By: Staci Righter M.D.   On: 05/18/2018 17:22    Procedures Procedures (including critical care time)  Medications Ordered in ED Medications  iohexol (OMNIPAQUE) 300 MG/ML solution 50 mL ( Intravenous Canceled Entry 05/18/18 1731)  iopamidol (ISOVUE-370) 76 % injection 50 mL (50 mLs Intravenous  Contrast Given 05/18/18 1726)     Initial Impression / Assessment and Plan / ED Course  I have reviewed the triage vital signs and the nursing notes.  Pertinent labs & imaging results that were available during my care of the patient were reviewed by me and considered in my medical decision making (see chart for details).     47 yo M with a chief complaint of sudden onset difficulty with speech and left-sided weakness.  Patient was taken back as a code stroke.  He was protecting his airway and was evaluated at the bridge.  CT of the head without acute intracranial hemorrhage and viewed by me.  Angiogram concerning for a large vessel occlusion.  Taken urgently to IR.  CRITICAL CARE Performed by: Cecilio Asper   Total critical care time: 35 minutes  Critical care time was exclusive of separately billable procedures and treating other patients.  Critical care was necessary to treat or prevent imminent or life-threatening deterioration.  Critical care was time spent personally by me on the following activities: development of treatment plan with patient and/or surrogate as well as nursing, discussions with consultants, evaluation of patient's response to treatment, examination of patient, obtaining history from patient or surrogate, ordering and performing treatments and interventions, ordering and review of laboratory studies, ordering and review of radiographic studies, pulse oximetry and re-evaluation of patient's condition.  The patients results and plan were reviewed and discussed.   Any x-rays performed were independently reviewed by myself.   Differential diagnosis were considered with the presenting HPI.  Medications  iohexol (OMNIPAQUE) 300 MG/ML solution 50 mL ( Intravenous Canceled Entry 05/18/18 1731)  iopamidol (ISOVUE-370) 76 % injection 50 mL (50 mLs Intravenous Contrast Given 05/18/18 1726)    There were no vitals filed for this visit.  Final diagnoses:  Acute  ischemic stroke [facility])    Admission/ observation were discussed with the admitting physician, patient and/or family and they are comfortable with the plan.   Final Clinical Impressions(s) / ED Diagnoses   Final diagnoses:  Acute ischemic stroke [facility])    ED Discharge Orders    None       Deno Etienne, DO 05/18/18 1736

## 2018-05-19 ENCOUNTER — Inpatient Hospital Stay (HOSPITAL_COMMUNITY): Payer: Medicare Other

## 2018-05-19 ENCOUNTER — Other Ambulatory Visit: Payer: Self-pay

## 2018-05-19 ENCOUNTER — Encounter (HOSPITAL_COMMUNITY): Payer: Self-pay | Admitting: Interventional Radiology

## 2018-05-19 DIAGNOSIS — I425 Other restrictive cardiomyopathy: Secondary | ICD-10-CM | POA: Diagnosis present

## 2018-05-19 DIAGNOSIS — I340 Nonrheumatic mitral (valve) insufficiency: Secondary | ICD-10-CM

## 2018-05-19 LAB — LIPID PANEL
Cholesterol: 122 mg/dL (ref 0–200)
HDL: 32 mg/dL — ABNORMAL LOW (ref >40)
LDL Cholesterol: 71 mg/dL (ref 0–99)
Total CHOL/HDL Ratio: 3.8 RATIO
Triglycerides: 95 mg/dL (ref <150)
VLDL: 19 mg/dL (ref 0–40)

## 2018-05-19 LAB — BASIC METABOLIC PANEL WITH GFR
Anion gap: 8 (ref 5–15)
BUN: 6 mg/dL (ref 6–20)
CO2: 25 mmol/L (ref 22–32)
Calcium: 8.6 mg/dL — ABNORMAL LOW (ref 8.9–10.3)
Chloride: 103 mmol/L (ref 98–111)
Creatinine, Ser: 0.96 mg/dL (ref 0.61–1.24)
GFR calc Af Amer: >60 mL/min
GFR calc non Af Amer: >60 mL/min
Glucose, Bld: 97 mg/dL (ref 70–99)
Potassium: 3.8 mmol/L (ref 3.5–5.1)
Sodium: 136 mmol/L (ref 135–145)

## 2018-05-19 LAB — CBC WITH DIFFERENTIAL/PLATELET
Abs Immature Granulocytes: 0 K/uL (ref 0.0–0.1)
Basophils Absolute: 0 K/uL (ref 0.0–0.1)
Basophils Relative: 1 %
Eosinophils Absolute: 0 K/uL (ref 0.0–0.7)
Eosinophils Relative: 1 %
HCT: 46.4 % (ref 39.0–52.0)
Hemoglobin: 15 g/dL (ref 13.0–17.0)
Immature Granulocytes: 0 %
Lymphocytes Relative: 22 %
Lymphs Abs: 1.1 K/uL (ref 0.7–4.0)
MCH: 29.5 pg (ref 26.0–34.0)
MCHC: 32.3 g/dL (ref 30.0–36.0)
MCV: 91.2 fL (ref 78.0–100.0)
Monocytes Absolute: 0.7 K/uL (ref 0.1–1.0)
Monocytes Relative: 14 %
Neutro Abs: 2.9 K/uL (ref 1.7–7.7)
Neutrophils Relative %: 62 %
Platelets: 266 K/uL (ref 150–400)
RBC: 5.09 MIL/uL (ref 4.22–5.81)
RDW: 15.4 % (ref 11.5–15.5)
WBC: 4.7 K/uL (ref 4.0–10.5)

## 2018-05-19 LAB — HEMOGLOBIN A1C
Hgb A1c MFr Bld: 6 % — ABNORMAL HIGH (ref 4.8–5.6)
Mean Plasma Glucose: 125.5 mg/dL

## 2018-05-19 LAB — HIV ANTIBODY (ROUTINE TESTING W REFLEX): HIV Screen 4th Generation wRfx: NONREACTIVE

## 2018-05-19 LAB — ECHOCARDIOGRAM COMPLETE
Height: 68 in
Weight: 2610.25 oz

## 2018-05-19 LAB — MRSA PCR SCREENING: MRSA by PCR: NEGATIVE

## 2018-05-19 LAB — GLUCOSE, CAPILLARY: Glucose-Capillary: 82 mg/dL (ref 70–99)

## 2018-05-19 MED ORDER — ATORVASTATIN CALCIUM 10 MG PO TABS
10.0000 mg | ORAL_TABLET | Freq: Every day | ORAL | Status: DC
  Administered 2018-05-19: 10 mg via ORAL
  Filled 2018-05-19: qty 1

## 2018-05-19 MED ORDER — APIXABAN 5 MG PO TABS
5.0000 mg | ORAL_TABLET | Freq: Two times a day (BID) | ORAL | Status: DC
  Administered 2018-05-19 – 2018-05-20 (×3): 5 mg via ORAL
  Filled 2018-05-19 (×3): qty 1

## 2018-05-19 MED ORDER — LEVOTHYROXINE SODIUM 25 MCG PO TABS
25.0000 ug | ORAL_TABLET | Freq: Every day | ORAL | Status: DC
  Administered 2018-05-20: 25 ug via ORAL
  Filled 2018-05-19: qty 1

## 2018-05-19 MED ORDER — SACUBITRIL-VALSARTAN 24-26 MG PO TABS
1.0000 | ORAL_TABLET | Freq: Two times a day (BID) | ORAL | Status: DC
  Filled 2018-05-19 (×3): qty 1

## 2018-05-19 MED ORDER — CARVEDILOL 3.125 MG PO TABS
3.1250 mg | ORAL_TABLET | Freq: Two times a day (BID) | ORAL | Status: DC
  Administered 2018-05-19 – 2018-05-20 (×2): 3.125 mg via ORAL
  Filled 2018-05-19 (×2): qty 1

## 2018-05-19 NOTE — Evaluation (Signed)
Speech Language Pathology Evaluation Patient Details Name: Charles Hawkins MRN: [id number] DOB: 29-May-1971 Today's Date: 05/19/2018 Time: 9390-3009 SLP Time Calculation (min) (ACUTE ONLY): 32 min  Problem List:  Patient Active Problem List   Diagnosis Date Noted  . 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 Acute ischemic stroke ([place]) 05/18/2018  . 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 Atrial flutter ([place]) 12/06/2014  . Chronic diastolic heart failure ([place]) 03/03/2014  . Palpitations 03/03/2014  . Acute gout 02/06/2014  . Infiltrative cardiomyopathy ([place]) 02/06/2014   Past Medical History:  Past Medical History:  Diagnosis Date  . Arthritis   . CHF (congestive heart failure) ([place])   . HTN (hypertension)   . 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 Multiple areas of subendocardial enhancement on MRI. Fat pad and myocardial biopsy negative at [facility].    Past Surgical History: History reviewed. No pertinent surgical history. HPI:   Charles Hawkins is a 47 y.o. 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   Assessment / Plan / Recommendation Clinical Impression  Patient's wife was present for evaluation. Cognistat was given and patient score WFL for all areas of test. Pt's wife reported his language and cognition are at baseline and his slurred speech has resolved. No further speech services are recommended at this time. Signing off.     SLP Assessment  SLP Recommendation/Assessment: Patient does not need any further Speech Lanaguage Pathology Services                     SLP Evaluation Cognition  Overall Cognitive Status: Within Functional Limits for tasks  assessed Arousal/Alertness: Awake/alert Orientation Level: Oriented X4 Attention: Focused Memory: Appears intact Awareness: Appears intact Problem Solving: Appears intact Executive Function: Reasoning Safety/Judgment: Appears intact       Comprehension  Auditory Comprehension Overall Auditory Comprehension: Appears within functional limits for tasks assessed Yes/No Questions: Within Functional Limits Commands: Within Functional Limits Visual Recognition/Discrimination Discrimination: Within Function Limits Reading Comprehension Reading Status: Not tested    Expression Expression Primary Mode of Expression: Verbal Verbal Expression Overall Verbal Expression: Appears within functional limits for tasks assessed Initiation: No impairment Repetition: No impairment Naming: No impairment Pragmatics: No impairment Non-Verbal Means of Communication: Not applicable Written Expression Written Expression: Within Functional Limits   Oral / Motor  Oral Motor/Sensory Function Overall Oral Motor/Sensory Function: Within functional limits Motor Speech Overall Motor Speech: Appears within functional limits for tasks assessed Respiration: Within functional limits Phonation: Normal Resonance: Within functional limits Articulation: Within functional limitis Intelligibility: Intelligible Motor Planning: Witnin functional limits Motor Speech Errors: Not applicable   GO                    Charles Hawkins Charles Mestre, MA, CCC-SLP 05/19/2018 10:53 AM

## 2018-05-19 NOTE — Progress Notes (Signed)
Follow up neurological examination:  Ment: Alert and oriented. Speech fluent. No neglect. CN: EOMI, visual fields intact. Subtle left facial droop. Subtle dysarthria.  Motor: 5/5 in all 4 extremities Sensory: No extinction.  Cerebellar: No ataxia with FNF bilaterally.   A/P: 47 year old right handed male presenting with sudden onset of left sided weakness, right gaze deviation and dysarthria. CTA showed right M1 occlusion 1. Significant improvement on follow up exam following RT common carotid arteriogram followed by endovascular complete revascularization of occluded RT MCA M 1 seg  With x 1 pass with 19mm x 22mm embotrap retrieve device achieving a TICI 3  Reperfusion. 2. Continue to monitor  Electronically signed: Dr. Kerney Elbe
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
## 2018-05-19 NOTE — Progress Notes (Signed)
Spoke w/ echo dept to help facilitate echo. Mikki Ziff PA-C

## 2018-05-19 NOTE — Consult Note (Addendum)
Advanced Heart Failure Rounding Note  PCP-Cardiologist:Dr Benismhon  Subjective:    Feeling ok. Denies SOB. He is s/p clot removal. Stroke symptoms nearly resolved except for very mild left facial droop. He is adamant that he only missed one dose of Eliquis. Remains in rate-controlled AF.   ECHO 05/19/2018  Left ventricle: The cavity size was normal. There was moderate   concentric hypertrophy. Systolic function was normal. The   estimated ejection fraction was in the range of 55% to 60%. Wall   motion was normal; there were no regional wall motion   abnormalities. - Mitral valve: Mild, late systolicprolapse, involving the anterior   leaflet and the posterior leaflet. There was mild to moderate   regurgitation directed centrally. - Left atrium: The atrium was moderately to severely dilated. - Right ventricle: Systolic function was moderately reduced. - Right atrium: The atrium was severely dilated. - Pulmonary arteries: Systolic pressure was moderately increased.   PA peak pressure: 68 mm Hg (S). Impressions: - Compared to the 2016 study, left ventricular systolic function   has normalized, but there is persistent pulmonary artery   hypertension and there is evidence of right ventricular   dysfunction and increased right atrial pressure.   Objective:   Weight Range: 163 lb 2.3 oz (74 kg) Body mass index is 24.81 kg/m.   Vital Signs:   Temp:  [97.5 F (36.4 C)-97.9 F (36.6 C)] 97.7 F (36.5 C) (07/22 0800) Pulse Rate:  [71-106] 85 (07/22 0700) Resp:  [17-30] 17 (07/22 0700) BP: (92-120)/(63-86) 92/76 (07/22 0700) SpO2:  [93 %-100 %] 96 % (07/22 0700) Arterial Line BP: (100-127)/(66-87) 100/69 (07/22 0700) Weight:  [163 lb 2.3 oz (74 kg)] 163 lb 2.3 oz (74 kg) (07/21 1905)    Weight change: Filed Weights   05/18/18 1905  Weight: 163 lb 2.3 oz (74 kg)    Intake/Output:   Intake/Output Summary (Last 24 hours) at 05/19/2018 0923 Last data filed at 05/19/2018  0700 Gross per 24 hour  Intake 2348.86 ml  Output 1000 ml  Net 1348.86 ml      Physical Exam    General:  Well appearing. No resp difficulty HEENT: Normal except for very mild right facial droop  Neck: Supple. JVP 6-7  . Carotids 2+ bilat; no bruits. No lymphadenopathy or thyromegaly appreciated. Cor: PMI nondisplaced. Irregular rate & rhythm. No rubs, gallops or murmurs. Lungs: Clear Abdomen: Soft, nontender, nondistended. No hepatosplenomegaly. No bruits or masses. Good bowel sounds. Extremities: No cyanosis, clubbing, rash, edema Neuro: Alert & orientedx3, moves all 4 extremities w/o difficulty Seems to have full strength. No pronator drift . Affect pleasant   Telemetry    Atrial fibrillation in 70s. Personally reviewed   EKG    N/A   Labs    CBC Recent Labs    05/18/18 1715 05/19/18 0233  WBC 5.5 4.7  NEUTROABS 2.1 2.9  HGB 16.3 15.0  HCT 50.4 46.4  MCV 91.3 91.2  PLT 315 357   Basic Metabolic Panel Recent Labs    05/18/18 1715 05/19/18 0233  NA 136 136  K 4.6 3.8  CL 101 103  CO2 20* 25  GLUCOSE 103* 97  BUN 7 6  CREATININE 1.04 0.96  CALCIUM 8.8* 8.6*   Liver Function Tests Recent Labs    05/18/18 1715  AST 48*  ALT 28  ALKPHOS 202*  BILITOT 2.5*  PROT 7.0  ALBUMIN 3.2*   No results for input(s): LIPASE, AMYLASE in the last 72  hours. Cardiac Enzymes No results for input(s): CKTOTAL, CKMB, CKMBINDEX, TROPONINI in the last 72 hours.  BNP: BNP (last 3 results) No results for input(s): BNP in the last 8760 hours.  ProBNP (last 3 results) No results for input(s): PROBNP in the last 8760 hours.   D-Dimer No results for input(s): DDIMER in the last 72 hours. Hemoglobin A1C Recent Labs    05/19/18 0233  HGBA1C 6.0*   Fasting Lipid Panel Recent Labs    05/19/18 0233  CHOL 122  HDL 32*  LDLCALC 71  TRIG 95  CHOLHDL 3.8   Thyroid Function Tests No results for input(s): TSH, T4TOTAL, T3FREE, THYROIDAB in the last 72  hours.  Invalid input(s): FREET3  Other results:   Imaging    Ct Angio Head W Or Wo Contrast  Result Date: 05/18/2018 CLINICAL DATA:  Focal neuro deficit, LEFT hemiparesis, RIGHT gaze preference. Last seen normal 1600 hours earlier today EXAM: CT ANGIOGRAPHY HEAD AND NECK TECHNIQUE: Multidetector CT imaging of the head and neck was performed using the standard protocol during bolus administration of intravenous contrast. Multiplanar CT image reconstructions and MIPs were obtained to evaluate the vascular anatomy. Carotid stenosis measurements (when applicable) are obtained utilizing NASCET criteria, using the distal internal carotid diameter as the denominator. CONTRAST:  90mL ISOVUE-370 IOPAMIDOL (ISOVUE-370) INJECTION 76% COMPARISON:  Code stroke CT earlier in the day. FINDINGS: CTA NECK FINDINGS Aortic arch: Standard branching. Imaged portion shows no evidence of aneurysm or dissection. No significant stenosis of the major arch vessel origins. Right carotid system: No evidence of dissection, stenosis (50% or greater) or occlusion. Left carotid system: No evidence of dissection, stenosis (50% or greater) or occlusion. Minor atheromatous change. Vertebral arteries: BILATERAL patent, LEFT dominant. Calcific plaque at the origin of the LEFT vertebral could result in 50% stenosis. Skeleton: Spondylosis. Some missing teeth. No worrisome osseous lesion. Other neck: No neck masses.  Normal thyroid.  RIGHT gaze preference. Upper chest: No lung apex lesion or pneumothorax. Review of the MIP images confirms the above findings CTA HEAD FINDINGS Anterior circulation: BILATERAL cavernous carotid calcifications, less than 50% stenosis. Acute distal RIGHT M1 MCA occlusion, extending into the bifurcation. Poor collateral flow into the visualized RIGHT M2 and M3 branches. Patent LEFT MCA territory. Both anterior cerebral artery segments are widely patent. Posterior circulation: Basilar artery widely patent. LEFT  vertebral dominant. No PCA or cerebellar stenosis or occlusion. No saccular aneurysm. Venous sinuses: As permitted by contrast timing, patent. Anatomic variants: None of significance. Delayed phase: Not performed. Review of the MIP images confirms the above findings IMPRESSION: Acute distal RIGHT M1 MCA occlusion, likely embolus. Poor collateral flow into the visualized RIGHT M2 and M3 branches. Mild nonstenotic extracranial and intracranial atherosclerotic change. These results were called by telephone at the time of interpretation on 05/18/2018 at 5:30 pm to Dr. Amie Portland , who verbally acknowledged these results. Electronically Signed   By: Staci Righter M.D.   On: 05/18/2018 17:52   Ct Angio Neck W Or Wo Contrast  Result Date: 05/18/2018 CLINICAL DATA:  Focal neuro deficit, LEFT hemiparesis, RIGHT gaze preference. Last seen normal 1600 hours earlier today EXAM: CT ANGIOGRAPHY HEAD AND NECK TECHNIQUE: Multidetector CT imaging of the head and neck was performed using the standard protocol during bolus administration of intravenous contrast. Multiplanar CT image reconstructions and MIPs were obtained to evaluate the vascular anatomy. Carotid stenosis measurements (when applicable) are obtained utilizing NASCET criteria, using the distal internal carotid diameter as the denominator. CONTRAST:  53mL ISOVUE-370 IOPAMIDOL (ISOVUE-370) INJECTION 76% COMPARISON:  Code stroke CT earlier in the day. FINDINGS: CTA NECK FINDINGS Aortic arch: Standard branching. Imaged portion shows no evidence of aneurysm or dissection. No significant stenosis of the major arch vessel origins. Right carotid system: No evidence of dissection, stenosis (50% or greater) or occlusion. Left carotid system: No evidence of dissection, stenosis (50% or greater) or occlusion. Minor atheromatous change. Vertebral arteries: BILATERAL patent, LEFT dominant. Calcific plaque at the origin of the LEFT vertebral could result in 50% stenosis. Skeleton:  Spondylosis. Some missing teeth. No worrisome osseous lesion. Other neck: No neck masses.  Normal thyroid.  RIGHT gaze preference. Upper chest: No lung apex lesion or pneumothorax. Review of the MIP images confirms the above findings CTA HEAD FINDINGS Anterior circulation: BILATERAL cavernous carotid calcifications, less than 50% stenosis. Acute distal RIGHT M1 MCA occlusion, extending into the bifurcation. Poor collateral flow into the visualized RIGHT M2 and M3 branches. Patent LEFT MCA territory. Both anterior cerebral artery segments are widely patent. Posterior circulation: Basilar artery widely patent. LEFT vertebral dominant. No PCA or cerebellar stenosis or occlusion. No saccular aneurysm. Venous sinuses: As permitted by contrast timing, patent. Anatomic variants: None of significance. Delayed phase: Not performed. Review of the MIP images confirms the above findings IMPRESSION: Acute distal RIGHT M1 MCA occlusion, likely embolus. Poor collateral flow into the visualized RIGHT M2 and M3 branches. Mild nonstenotic extracranial and intracranial atherosclerotic change. These results were called by telephone at the time of interpretation on 05/18/2018 at 5:30 pm to Dr. Amie Portland , who verbally acknowledged these results. Electronically Signed   By: Staci Righter M.D.   On: 05/18/2018 17:52   Mr Brain Wo Contrast  Result Date: 05/19/2018 CLINICAL DATA:  47 year old male found to have distal right M1 occlusion, status post catheter directed revascularization with TICI 3 reperfusion. EXAM: MRI HEAD WITHOUT CONTRAST TECHNIQUE: Multiplanar, multiecho pulse sequences of the brain and surrounding structures were obtained without intravenous contrast. COMPARISON:  Prior CTA and CT from 05/18/2018 FINDINGS: Brain: Cerebral volume within normal limits for age. No significant cerebral white matter changes for age. Small remote right cortical infarct at the right parietal lobe (series 11, image 18). Mildly increased  diffusion signal abnormality at the right insula and right frontal operculum without frank restriction (series 5, image 61). Single punctate focus of restricted diffusion more superiorly within the posterior right frontal region (series 5, image 70). No other evidence for acute or subacute ischemia. Gray-white matter differentiation otherwise maintained. No acute or chronic intracranial hemorrhage. No mass lesion, midline shift or mass effect. No hydrocephalus. No extra-axial fluid collection. Pituitary gland normal. Vascular: Major intracranial vascular flow voids are maintained. Skull and upper cervical spine: Craniocervical junction normal. Upper cervical spine normal. Bone marrow signal intensity normal. No scalp soft tissue abnormality. Sinuses/Orbits: Globes and orbital soft tissues within normal limits. Scattered mucosal thickening throughout the paranasal sinuses. Trace opacity left mastoid air cells, of doubtful significance. Inner ear structures normal. Other: None. IMPRESSION: 1. Mildly increased diffusion abnormality within the right insula and right frontal operculum without frank restriction, consistent with mild ischemic insult. Additional punctate cortical infarct more superiorly within the posterior right frontal region. No other evidence for acute ischemia. 2. 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 3. Otherwise negative brain MRI. Electronically Signed   By: Jeannine Boga M.D.   On: 05/19/2018 05:42   Dg Chest Port 1 View  Result Date: 05/19/2018 CLINICAL DATA:  Ischemic stroke.  EXAM: PORTABLE CHEST 1 VIEW COMPARISON:  10/05/2014 FINDINGS: Cardiac enlargement. No vascular congestion. Increased density in the right lower lung likely representing focal pneumonia. No blunting of costophrenic angles. No pneumothorax. Mediastinal contours appear intact. IMPRESSION: Cardiac enlargement. Focal infiltration in the right lower lung likely representing pneumonia.  Electronically Signed   By: Lucienne Capers M.D.   On: 05/19/2018 00:37   Ct Head Code Stroke Wo Contrast`  Result Date: 05/18/2018 CLINICAL DATA:  Code stroke.  LEFT-sided weakness.  Aphasia. EXAM: CT HEAD WITHOUT CONTRAST TECHNIQUE: Contiguous axial images were obtained from the base of the skull through the vertex without intravenous contrast. COMPARISON:  None. FINDINGS: The patient was unable to remain motionless for the exam. Small or subtle lesions could be overlooked. Brain: Slight hypoattenuation of the RIGHT insular ribbon as compared to the LEFT, could represent early ischemia. No hemorrhage, mass lesion, hydrocephalus, or extra-axial fluid. Normal for age cerebral volume. No definite white matter disease. Vascular: There is Hounsfield artifact and mild motion which obscure the slices through the circle of Willis. However, the RIGHT MCA is questionably more dense than the LEFT, as seen on coronal series 5 image 25 and sagittal series 6, image 25. Skull: Normal. Negative for fracture or focal lesion. Sinuses/Orbits: No acute finding. Other: None. ASPECTS [facility] Stroke Program Early CT Score) - Ganglionic level infarction (caudate, lentiform nuclei, internal capsule, insula, M1-M3 cortex): 6 - Supraganglionic infarction (M4-M6 cortex): 3 Total score (0-10 with 10 being normal): 9 IMPRESSION: 1. Somewhat reduced quality exam demonstrating equivocal hypoattenuation of the RIGHT insular ribbon is compared to the LEFT. RIGHT MCA may be questionably more dense than LEFT, on multiplanar reformations. 2. ASPECTS is 9. These results were communicated to Dr. Rory Percy at 5:21 pmon 7/21/2019by text page via the [facility] messaging system. Electronically Signed   By: Staci Righter M.D.   On: 05/18/2018 17:22      Medications:     Scheduled Medications:   Infusions: . sodium chloride    . sodium chloride 75 mL/hr at 05/19/18 0700  . niCARDipine       PRN Medications:  acetaminophen **OR**  acetaminophen (TYLENOL) oral liquid 160 mg/5 mL **OR** acetaminophen, fentaNYL (SUBLIMAZE) injection, iohexol, iohexol, ondansetron (ZOFRAN) IV, oxyCODONE **OR** oxyCODONE, senna-docusate    Patient Profile   Charles Hawkins is a 47 y/o male with h/o HTN, h/o bilateral club feet s/p surgical repair, gout, arthritis and restrictive cardiomyopathy.  Admitted with  Left sided weakness and slurred speech. He was emergently taken to the interventional suite with endovascular revascularization of his vessel and successful reperfusion. He was not considered a candidate for TPA due to him being on Eliquis.   Assessment/Plan   1. Acute Embolic Stroke  Stroke Team managing. Missed one dose of eliquis prior to admit.  He underwent emergent CTA found a right M1 MCA occlusion consistent with an embolic stroke. He was urgently taken to the interventional suite for possible endovascular revascularization of the right MCA. Success was achieved with x 1 pass with 48mm x 67mm embotrap retrieve device achieving a TICI 3 reperfusion. Discussed with Neurology at bedside.  Restarting eliquis and adding statin.   2. Restrictive Cardiomyopathy  Followed by [facility] for possible transplant. He has had transplant evaluation and was not listed due to tobacco and THC use.  Last seen 11/2017  ECHO completed. EF now improved to 55-60%.  -He is hesitant to restart carvedilol. Dr Haroldine Laws will discuss with Dr Posey Pronto at [facility]    -Volume status  mildly elevated.  -Renal function stable. Prior to admit he was not on carvedilol or entresto due to intolerance. He has not been on in several months.    3. A fibrillation -  Amiodarone was stopped by his PCP due to low BP and hypothyroidism. Looks like he has been in A flutter at his last visit with Dr Posey Pronto 11/2017. There was discussion about A flutter ablation but does not look like he had that appointment. Rate controlled.   Hold off on amiodarone. Would like to get follow up with  EP.   Starting eliquis 5 mg twice a day.   4. THC  Noted on drug scree. We discussed cessation.   5. Hypothyroidism Restart synthroid 25 mcg daily. Home dose.    Follow-up with [facility] cardiologist Posey Pronto, Gweneth Fritter, MD) was scheduled for 06/26/2018  Medication concerns reviewed with patient and pharmacy team. Barriers identified: none   Length of Stay: 1  Amy Clegg, NP  05/19/2018, 9:23 AM  Advanced Heart Failure Team Pager [phone] (M-F; [place])  Please contact [facility] for night-coverage after hours (4p -7a ) and weekends on [email or web address]  Patient seen and examined with the above-signed Advanced Practice Provider and/or Housestaff. I personally reviewed laboratory data, imaging studies and relevant notes. I independently examined the patient and formulated the important aspects of the plan. I have edited the note to reflect any of my changes or salient points. I have personally discussed the plan with the patient and/or family.  47 y/o male with h/o restrictive CM due to likely infiltrative CM with systolic dysfunction and chronic AF/AFL. Now admitted with acute embolic CVA. He is s/p clot retrieval with minimal if any residual neuro deficit. Echo reviewed shows normalization of LV function with persistent RV dysfunction and no evidence of LV clot. Has been followed by Dr. Posey Pronto and the Transplant team at [facility] stopped several months ago as he had chronic AF/AFL. Functional status has improved greatly. Has not been on Entresto or carvedilol due to intolerance. Would not start currently in setting of marginal BP, recent CVA and normalization of EF. Patient is adamant that he missed only one dose of Eliquis. Stressed need not to miss further doses. Will continue to follow in house.  Glori Bickers, MD  10:37 PM

## 2018-05-19 NOTE — Progress Notes (Signed)
  Echocardiogram 2D Echocardiogram has been performed.  Charles Hawkins 05/19/2018, 8:26 AM

## 2018-05-19 NOTE — Progress Notes (Addendum)
STROKE TEAM PROGRESS NOTE  HPI: Charles Hawkins is a 47 y.o. 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 No preceding illnesses have been reported.  He has not had a stroke in his life before He took his last dose of Eliquis at noon today.  Reports compliance to all his medications. He follows with multiple doctors at [facility], [facility] and [facility] system. His wife and sister arrived at bedside and provided and confirmed the history that was initially provided by EMS. He was taken for a Noncon CT of the head that showed a dense MCA sign and aspect score of 9.  CT angiogram head and neck showed a right M1 cutoff. An endovascular consultation was obtained emergently and decision was made to take him for emergent endovascular thrombectomy.  He is not a candidate for TPA due to last dose of Eliquis being taken just a few hours prior to presentation.   LKW: 1605 hrs 05/18/18 tpa given?: no, on Eliquis Premorbid modified Rankin scale (mRS): 1 Status post endovascular revascularization of the right M1 occlusion successfully with TICI 3 recanalizationion  HISTORY His RNs are at the bedside.  Pt lying in bed with HOB up. CHF team coming to see him. Overall, doing very well from stroke standpoint. Did miss 1 dose eliquis on Saturday, which may have contributed to stroke.  Vitals:   05/19/18 0430 05/19/18 0530 05/19/18 0600 05/19/18 0700  BP: 101/78 101/80 96/82 92/76   Pulse: 78 80 82 85  Resp: (!) 23 (!) 24 (!) 23 17  Temp:      TempSrc:      SpO2: 97% 93% 96% 96%  Weight:      Height:        CBC:  Recent Labs  Lab 05/18/18 1715 05/19/18 0233  WBC 5.5 4.7  NEUTROABS 2.1 2.9  HGB 16.3 15.0  HCT  50.4 46.4  MCV 91.3 91.2  PLT 315 161    Basic Metabolic Panel:  Recent Labs  Lab 05/18/18 1715 05/19/18 0233  NA 136 136  K 4.6 3.8  CL 101 103  CO2 20* 25  GLUCOSE 103* 97  BUN 7 6  CREATININE 1.04 0.96  CALCIUM 8.8* 8.6*   Lipid Panel:     Component Value Date/Time   CHOL 122 05/19/2018 0233   TRIG 95 05/19/2018 0233   HDL 32 (L) 05/19/2018 0233   CHOLHDL 3.8 05/19/2018 0233   VLDL 19 05/19/2018 0233   LDLCALC 71 05/19/2018 0233   HgbA1c:  Lab Results  Component Value Date   HGBA1C 6.0 (H) 05/19/2018   Urine Drug Screen:     Component Value Date/Time   LABOPIA NONE DETECTED 05/18/2018 1811   COCAINSCRNUR NONE DETECTED 05/18/2018 1811   LABBENZ NONE DETECTED 05/18/2018 1811   AMPHETMU NONE DETECTED 05/18/2018 1811   THCU POSITIVE (A) 05/18/2018 1811   LABBARB NONE DETECTED 05/18/2018 1811    Alcohol Level     Component Value Date/Time   ETH 43 (H) 05/18/2018 1714    IMAGING Ct Angio Head W Or Wo Contrast  Result Date: 05/18/2018 CLINICAL DATA:  Focal neuro deficit, LEFT hemiparesis, RIGHT gaze preference. Last seen normal 1600 hours earlier today EXAM: CT ANGIOGRAPHY  HEAD AND NECK TECHNIQUE: Multidetector CT imaging of the head and neck was performed using the standard protocol during bolus administration of intravenous contrast. Multiplanar CT image reconstructions and MIPs were obtained to evaluate the vascular anatomy. Carotid stenosis measurements (when applicable) are obtained utilizing NASCET criteria, using the distal internal carotid diameter as the denominator. CONTRAST:  24mL ISOVUE-370 IOPAMIDOL (ISOVUE-370) INJECTION 76% COMPARISON:  Code stroke CT earlier in the day. FINDINGS: CTA NECK FINDINGS Aortic arch: Standard branching. Imaged portion shows no evidence of aneurysm or dissection. No significant stenosis of the major arch vessel origins. Right carotid system: No evidence of dissection, stenosis (50% or greater) or occlusion. Left carotid  system: No evidence of dissection, stenosis (50% or greater) or occlusion. Minor atheromatous change. Vertebral arteries: BILATERAL patent, LEFT dominant. Calcific plaque at the origin of the LEFT vertebral could result in 50% stenosis. Skeleton: Spondylosis. Some missing teeth. No worrisome osseous lesion. Other neck: No neck masses.  Normal thyroid.  RIGHT gaze preference. Upper chest: No lung apex lesion or pneumothorax. Review of the MIP images confirms the above findings CTA HEAD FINDINGS Anterior circulation: BILATERAL cavernous carotid calcifications, less than 50% stenosis. Acute distal RIGHT M1 MCA occlusion, extending into the bifurcation. Poor collateral flow into the visualized RIGHT M2 and M3 branches. Patent LEFT MCA territory. Both anterior cerebral artery segments are widely patent. Posterior circulation: Basilar artery widely patent. LEFT vertebral dominant. No PCA or cerebellar stenosis or occlusion. No saccular aneurysm. Venous sinuses: As permitted by contrast timing, patent. Anatomic variants: None of significance. Delayed phase: Not performed. Review of the MIP images confirms the above findings IMPRESSION: Acute distal RIGHT M1 MCA occlusion, likely embolus. Poor collateral flow into the visualized RIGHT M2 and M3 branches. Mild nonstenotic extracranial and intracranial atherosclerotic change. These results were called by telephone at the time of interpretation on 05/18/2018 at 5:30 pm to Dr. Amie Portland , who verbally acknowledged these results. Electronically Signed   By: Staci Righter M.D.   On: 05/18/2018 17:52   Ct Angio Neck W Or Wo Contrast  Result Date: 05/18/2018 CLINICAL DATA:  Focal neuro deficit, LEFT hemiparesis, RIGHT gaze preference. Last seen normal 1600 hours earlier today EXAM: CT ANGIOGRAPHY HEAD AND NECK TECHNIQUE: Multidetector CT imaging of the head and neck was performed using the standard protocol during bolus administration of intravenous contrast. Multiplanar CT  image reconstructions and MIPs were obtained to evaluate the vascular anatomy. Carotid stenosis measurements (when applicable) are obtained utilizing NASCET criteria, using the distal internal carotid diameter as the denominator. CONTRAST:  64mL ISOVUE-370 IOPAMIDOL (ISOVUE-370) INJECTION 76% COMPARISON:  Code stroke CT earlier in the day. FINDINGS: CTA NECK FINDINGS Aortic arch: Standard branching. Imaged portion shows no evidence of aneurysm or dissection. No significant stenosis of the major arch vessel origins. Right carotid system: No evidence of dissection, stenosis (50% or greater) or occlusion. Left carotid system: No evidence of dissection, stenosis (50% or greater) or occlusion. Minor atheromatous change. Vertebral arteries: BILATERAL patent, LEFT dominant. Calcific plaque at the origin of the LEFT vertebral could result in 50% stenosis. Skeleton: Spondylosis. Some missing teeth. No worrisome osseous lesion. Other neck: No neck masses.  Normal thyroid.  RIGHT gaze preference. Upper chest: No lung apex lesion or pneumothorax. Review of the MIP images confirms the above findings CTA HEAD FINDINGS Anterior circulation: BILATERAL cavernous carotid calcifications, less than 50% stenosis. Acute distal RIGHT M1 MCA occlusion, extending into the bifurcation. Poor collateral flow into the visualized RIGHT M2 and M3 branches.  Patent LEFT MCA territory. Both anterior cerebral artery segments are widely patent. Posterior circulation: Basilar artery widely patent. LEFT vertebral dominant. No PCA or cerebellar stenosis or occlusion. No saccular aneurysm. Venous sinuses: As permitted by contrast timing, patent. Anatomic variants: None of significance. Delayed phase: Not performed. Review of the MIP images confirms the above findings IMPRESSION: Acute distal RIGHT M1 MCA occlusion, likely embolus. Poor collateral flow into the visualized RIGHT M2 and M3 branches. Mild nonstenotic extracranial and intracranial  atherosclerotic change. These results were called by telephone at the time of interpretation on 05/18/2018 at 5:30 pm to Dr. Amie Portland , who verbally acknowledged these results. Electronically Signed   By: Staci Righter M.D.   On: 05/18/2018 17:52   Mr Brain Wo Contrast  Result Date: 05/19/2018 CLINICAL DATA:  47 year old male found to have distal right M1 occlusion, status post catheter directed revascularization with TICI 3 reperfusion. EXAM: MRI HEAD WITHOUT CONTRAST TECHNIQUE: Multiplanar, multiecho pulse sequences of the brain and surrounding structures were obtained without intravenous contrast. COMPARISON:  Prior CTA and CT from 05/18/2018 FINDINGS: Brain: Cerebral volume within normal limits for age. No significant cerebral white matter changes for age. Small remote right cortical infarct at the right parietal lobe (series 11, image 18). Mildly increased diffusion signal abnormality at the right insula and right frontal operculum without frank restriction (series 5, image 61). Single punctate focus of restricted diffusion more superiorly within the posterior right frontal region (series 5, image 70). No other evidence for acute or subacute ischemia. Gray-white matter differentiation otherwise maintained. No acute or chronic intracranial hemorrhage. No mass lesion, midline shift or mass effect. No hydrocephalus. No extra-axial fluid collection. Pituitary gland normal. Vascular: Major intracranial vascular flow voids are maintained. Skull and upper cervical spine: Craniocervical junction normal. Upper cervical spine normal. Bone marrow signal intensity normal. No scalp soft tissue abnormality. Sinuses/Orbits: Globes and orbital soft tissues within normal limits. Scattered mucosal thickening throughout the paranasal sinuses. Trace opacity left mastoid air cells, of doubtful significance. Inner ear structures normal. Other: None. IMPRESSION: 1. Mildly increased diffusion abnormality within the right insula  and right frontal operculum without frank restriction, consistent with mild ischemic insult. Additional punctate cortical infarct more superiorly within the posterior right frontal region. No other evidence for acute ischemia. 2. 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 3. Otherwise negative brain MRI. Electronically Signed   By: Jeannine Boga M.D.   On: 05/19/2018 05:42   Dg Chest Port 1 View  Result Date: 05/19/2018 CLINICAL DATA:  Ischemic stroke. EXAM: PORTABLE CHEST 1 VIEW COMPARISON:  10/05/2014 FINDINGS: Cardiac enlargement. No vascular congestion. Increased density in the right lower lung likely representing focal pneumonia. No blunting of costophrenic angles. No pneumothorax. Mediastinal contours appear intact. IMPRESSION: Cardiac enlargement. Focal infiltration in the right lower lung likely representing pneumonia. Electronically Signed   By: Lucienne Capers M.D.   On: 05/19/2018 00:37   Ct Head Code Stroke Wo Contrast`  Result Date: 05/18/2018 CLINICAL DATA:  Code stroke.  LEFT-sided weakness.  Aphasia. EXAM: CT HEAD WITHOUT CONTRAST TECHNIQUE: Contiguous axial images were obtained from the base of the skull through the vertex without intravenous contrast. COMPARISON:  None. FINDINGS: The patient was unable to remain motionless for the exam. Small or subtle lesions could be overlooked. Brain: Slight hypoattenuation of the RIGHT insular ribbon as compared to the LEFT, could represent early ischemia. No hemorrhage, mass lesion, hydrocephalus, or extra-axial fluid. Normal for age cerebral volume. No definite white  matter disease. Vascular: There is Hounsfield artifact and mild motion which obscure the slices through the circle of Willis. However, the RIGHT MCA is questionably more dense than the LEFT, as seen on coronal series 5 image 25 and sagittal series 6, image 25. Skull: Normal. Negative for fracture or focal lesion. Sinuses/Orbits: No acute finding. Other: None.  ASPECTS [facility] Stroke Program Early CT Score) - Ganglionic level infarction (caudate, lentiform nuclei, internal capsule, insula, M1-M3 cortex): 6 - Supraganglionic infarction (M4-M6 cortex): 3 Total score (0-10 with 10 being normal): 9 IMPRESSION: 1. Somewhat reduced quality exam demonstrating equivocal hypoattenuation of the RIGHT insular ribbon is compared to the LEFT. RIGHT MCA may be questionably more dense than LEFT, on multiplanar reformations. 2. ASPECTS is 9. These results were communicated to Dr. Rory Percy at 5:21 pmon 7/21/2019by text page via the [facility] messaging system. Electronically Signed   By: Staci Righter M.D.   On: 05/18/2018 17:22   Cerebral angiogram  S/P RT common carotid arteriogram followed by endovascular complete revascularization of occluded RT MCA M 1 seg  With x 1 pass with 56mm x 98mm embotrap retrieve device achieving a TICI 3 Reperfusion  2D Echocardiogram  - Left ventricle: The cavity size was normal. There was moderate concentric hypertrophy. Systolic function was normal. The estimated ejection fraction was in the range of 55% to 60%. Wall motion was normal; there were no regional wall motion abnormalities. - Mitral valve: Mild, late systolicprolapse, involving the anterior leaflet and the posterior leaflet. There was mild to moderate regurgitation directed centrally. - Left atrium: The atrium was moderately to severely dilated. - Right ventricle: Systolic function was moderately reduced. - Right atrium: The atrium was severely dilated. - Pulmonary arteries: Systolic pressure was moderately increased. PA peak pressure: 68 mm Hg (S). Impressions:   Compared to the 2016 study, left ventricular systolic function has normalized, but there is persistent pulmonary artery hypertension and there is evidence of right ventricular dysfunction and increased right atrial pressure.   PHYSICAL EXAM  Pleasant middle-aged African-American male currently not in distress. . Afebrile.  Head is nontraumatic. Neck is supple without bruit.    Cardiac exam no murmur or gallop. Lungs are clear to auscultation. Distal pulses are well felt. Neurological Exam ;  Awake  Alert oriented x 3. Normal speech and language.eye movements full without nystagmus.fundi were not visualized. Vision acuity and fields appear normal. Hearing is normal. Palatal movements are normal. Face asymmetric with mild left nasolabial fold weakness when he smiles. Tongue midline. Normal strength, tone, reflexes and coordination. Normal sensation. Gait deferred.  ASSESSMENT/PLAN [name] is a 47 y.o. 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   Stroke:   R insular, R frontal operculum, punctate posterior R frontal cortical infarct embolic secondary to known AF with R M1 occlusion s/p endovascular therapy with TICI 3 in setting of 1 missed dose of AC   Code Stroke CT head hypoattenuation are insular ribbon, R MCA more dense than L. ASPECTS 9    CTA head & neck distal rM1 MCA occlusion.  Poor collateral flow.  Mild atherosclerosis.  Cerebral angiogram occluded R M1 MCA.  TICI3 reperfusion  Post IR CT stable per neuroradiologist  MRI  R insular, R frontal operculum, punctate posterior R frontal cortical infarct. Old  high R parietal cortical infarct.  2D Echo EF 55 to 60%.  LA moderately to severely dilated.  RA severely dilated.  Compared to 1025, LV systolic function normalized but there is persistent pulmonary artery hypertension and right ventricular dysfunction and increased right atrial pressure.  LDL 71  HgbA1c 6.0  SCDs -> Eliquis for VTE prophylaxis  Eliquis (apixaban) daily prior to admission - last dose  Sunday afternoon at 2p (also took Sat/Sun at 29 M)  - he did miss 1 dose on Saturday at 2p , now on No antithrombotic.  Resume Eliquis as prior to admission  Therapy recommendations: Pending  Disposition: Pending   Keep in ICU 24 hours post IR  Anticipate discharge home tomorrow  Atrial Flutter  Home anticoagulation:  Eliquis (apixaban) daily prior to admission - last dose Sunday afternoon at 2p (also took Sat/Sun at 46 M)  - he did miss 1 dose on Saturday at 2p  Now on No anticoagulant. Pt not interested in changing drug. He will make sure he gets his meds  Has been on amiodarone but stopped recently by PCP do to low blood pressure  Card added carvedilol  Considered a flutter ablation in past.  Needs follow-up with EP. Marland Kitchen Resumed Eliquis (apixaban) daily 5 mg bid, continue  at discharge  Pneumonia  CXR RLL infiltrate, cardiac enlargement  Repeat CXR in am  Hypertension  Goal Per neuroradiologist for 24 hours post IR   Hyperlipidemia  Home meds:  No statin  LDL 71, goal < 70  Add low dose statin - lipitor 10  Continue statin at discharge  Other Stroke Risk Factors  Former cigarette smoker  ETOH use, urine drug screen positive on admission 43  THC use.  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  Cardiology consulted  Other Active Problems  Hypothyroidism.  On [facility] day # 1  Burnetta Sabin, MSN, APRN, ANVP-BC, AGPCNP-BC Advanced Practice Stroke Nurse [place] for Schedule & Pager information 05/19/2018 1:04 PM  I have personally examined this patient, reviewed notes, independently viewed imaging studies, participated in medical decision making and plan of care.ROS completed by me personally and pertinent positives  fully documented  I have made any additions or clarifications directly to the above note. Agree with note above. He presented with right M1 occlusion due to atrial flutter and underwent successful mechanical thrombectomy and is clinically doing quite well. Check MRI scan of the brain and if there is no bleeding start eliquis. Patient counseled to be compliant with his medication. Strict blood pressure control as per post intervention protocol. Heart failure team to follow patient.This patient is critically ill and at significant risk of neurological worsening, death and care requires constant monitoring of vital signs, hemodynamics,respiratory and cardiac monitoring, extensive review of multiple databases, frequent neurological assessment, discussion with family, other specialists and medical decision making of high complexity.I have made any additions or clarifications directly to the above note.This critical care time does not reflect procedure time, or teaching time or supervisory time of PA/NP/Med Resident etc but could involve care discussion time.  I spent 30 minutes of neurocritical care time  in the care of  this patient.      Antony Contras, MD Medical Director [facility] [facility] Pager: [phone] 05/19/2018 5:07 PM  To contact  Stroke Continuity provider, please refer to [email or web address] After hours, contact General Neurology

## 2018-05-19 NOTE — Progress Notes (Signed)
Pt's BP lower than parameters of 170-017 systolic.  Notified Dr. Leonie Man at bedside.  MD OK with lower BP due to pt asymptomatic

## 2018-05-19 NOTE — Progress Notes (Addendum)
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 Estanislado Pandy. Patient awake and alert sitting in bed with no complaints at this time. No focal neurologic symptoms demonstrated- can spontaneously move all extremities. Right groin incision c/d/i.  MRI brain 05/19/2018: 1. Mildly increased diffusion abnormality within the right insula and right frontal operculum without frank restriction, consistent with mild ischemic insult. Additional punctate cortical infarct more superiorly within the posterior right frontal region. No other evidence for acute ischemia. 2. 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 3. Otherwise negative brain MRI.   Allergies: Lyrica [pregabalin] and Nsaids  Medications: Prior to Admission medications   Medication Sig Start Date End Date Taking? Authorizing Provider  amiodarone (PACERONE) 200 MG tablet TAKE 1 TABLET (200 MG TOTAL) BY MOUTH 2 (TWO) TIMES DAILY. Patient taking differently: TAKE 1 TABLET (200 MG TOTAL) BY MOUTH 2 (TWO) TIMES DAILY - NOON AND MIDNIGHT 05/17/15  Yes Bensimhon, Shaune Pascal, MD  buPROPion (WELLBUTRIN SR) 150 MG 12 hr tablet Take 1 tablet (150 mg total) by mouth 2 (two) times daily. Patient taking differently: Take 150 mg by mouth See admin instructions. Take 1 tablet (150 mg) by mouth twice daily - noon and midnight 04/21/15  Yes Bensimhon, Shaune Pascal, MD  carvedilol (COREG) 6.25 MG tablet TAKE 1 TABLET BY MOUTH TWICE A DAY Patient taking differently: TAKE 1 TABLET BY MOUTH TWICE A DAY - NOON AND MIDNIGHT 06/20/15  Yes Bensimhon, Shaune Pascal, MD  colchicine 0.6 MG tablet Take 1 tablet (0.6 mg total) by mouth daily. 1-2 times daily Patient taking differently: Take 0.6 mg by mouth See admin instructions. Take 1 tablet (0.6 mg) by mouth twice daily - noon and  midnight 12/01/17  Yes Gekas, Jesse Fall, PA-C  ELIQUIS 5 MG TABS tablet TAKE 1 TABLET (5 MG TOTAL) BY MOUTH 2 (TWO) TIMES DAILY. Patient taking differently: TAKE 1 TABLET (5 MG TOTAL) BY MOUTH 2 (TWO) TIMES DAILY 12p and 12a 11/16/16  Yes Bensimhon, Shaune Pascal, MD  levothyroxine (SYNTHROID, LEVOTHROID) 25 MCG tablet Take 25 mcg by mouth daily at 12 noon. 05/09/18  Yes [provider]  Multiple Vitamin (MULTIVITAMIN WITH MINERALS) TABS tablet Take 1 tablet by mouth daily at 12 noon.   Yes [provider]  pantoprazole (PROTONIX) 40 MG tablet Take 40 mg by mouth daily at 12 noon. 04/10/18  Yes [provider]  potassium chloride (MICRO-K) 10 MEQ CR capsule TAKE 2 TABLETS (20 MEQ TOTAL) BY MOUTH DAILY. Patient taking differently: Take 20 mEq by mouth daily at 12 noon.  03/11/17  Yes Larey Dresser, MD  sacubitril-valsartan (ENTRESTO) 24-26 MG Take 1 tablet by mouth 2 (two) times daily. Patient taking differently: Take 1 tablet by mouth See admin instructions. Take 1 tablet by mouth twice daily - noon and midnight 04/21/15  Yes Tillery, Satira Mccallum, PA-C  amiodarone (PACERONE) 100 MG tablet Take 1 tablet (100 mg total) by mouth daily. Patient not taking: Reported on 05/18/2018 03/22/15   Larey Dresser, MD  furosemide (LASIX) 40 MG tablet Take 40 mg (1 tablet) in the morningTake 20 mg ( 1/2 tablet) in the evening Patient taking differently: Take 20-40 mg by mouth See admin instructions. Take 1 tablet (40 mg) by mouth daily at noon and 1/2 tablet (20 mg) at midnight 08/22/15   Larey Dresser,  MD  nicotine (NICODERM CQ - DOSED IN MG/24 HR) 7 mg/24hr patch PLACE 1 PATCH (7 MG TOTAL) ONTO THE SKIN DAILY. Patient not taking: Reported on 10/17/2016 05/03/15   Larey Dresser, MD     Vital Signs: BP 92/76   Pulse 85   Temp 97.9 F (36.6 C) (Oral)   Resp 17   Ht 5\' 8"  (1.727 m)   Wt 163 lb 2.3 oz (74 kg)   SpO2 96%   BMI 24.81 kg/m   Physical Exam  Constitutional: He  appears well-developed and well-nourished. No distress.  Cardiovascular: Normal rate, regular rhythm and normal heart sounds.  No murmur heard. Pulmonary/Chest: Effort normal and breath sounds normal. No respiratory distress. He has no wheezes.  Neurological:  Alert, awake, and oriented x3. Speech and comprehension intact. PERRL bilaterally. EOMs intact bilaterally without nystagmus or subjective diplopia. Visual fields not assessed. Mild left facial droop. Tongue midline. Motor power symmetric proportional to effort. No pronator drift. Fine motor and coordination intact and symmetric. Gait not assessed. Romberg not assessed. Heel to toe not assessed. Distal pulses 1+ bilaterally.  Skin: Skin is warm and dry.  Right groin incision soft without active bleeding or hematoma.  Psychiatric: He has a normal mood and affect. His behavior is normal. Judgment and thought content normal.  Nursing note and vitals reviewed.   Imaging: Ct Angio Head W Or Wo Contrast  Result Date: 05/18/2018 CLINICAL DATA:  Focal neuro deficit, LEFT hemiparesis, RIGHT gaze preference. Last seen normal 1600 hours earlier today EXAM: CT ANGIOGRAPHY HEAD AND NECK TECHNIQUE: Multidetector CT imaging of the head and neck was performed using the standard protocol during bolus administration of intravenous contrast. Multiplanar CT image reconstructions and MIPs were obtained to evaluate the vascular anatomy. Carotid stenosis measurements (when applicable) are obtained utilizing NASCET criteria, using the distal internal carotid diameter as the denominator. CONTRAST:  31mL ISOVUE-370 IOPAMIDOL (ISOVUE-370) INJECTION 76% COMPARISON:  Code stroke CT earlier in the day. FINDINGS: CTA NECK FINDINGS Aortic arch: Standard branching. Imaged portion shows no evidence of aneurysm or dissection. No significant stenosis of the major arch vessel origins. Right carotid system: No evidence of dissection, stenosis (50% or greater) or  occlusion. Left carotid system: No evidence of dissection, stenosis (50% or greater) or occlusion. Minor atheromatous change. Vertebral arteries: BILATERAL patent, LEFT dominant. Calcific plaque at the origin of the LEFT vertebral could result in 50% stenosis. Skeleton: Spondylosis. Some missing teeth. No worrisome osseous lesion. Other neck: No neck masses.  Normal thyroid.  RIGHT gaze preference. Upper chest: No lung apex lesion or pneumothorax. Review of the MIP images confirms the above findings CTA HEAD FINDINGS Anterior circulation: BILATERAL cavernous carotid calcifications, less than 50% stenosis. Acute distal RIGHT M1 MCA occlusion, extending into the bifurcation. Poor collateral flow into the visualized RIGHT M2 and M3 branches. Patent LEFT MCA territory. Both anterior cerebral artery segments are widely patent. Posterior circulation: Basilar artery widely patent. LEFT vertebral dominant. No PCA or cerebellar stenosis or occlusion. No saccular aneurysm. Venous sinuses: As permitted by contrast timing, patent. Anatomic variants: None of significance. Delayed phase: Not performed. Review of the MIP images confirms the above findings IMPRESSION: Acute distal RIGHT M1 MCA occlusion, likely embolus. Poor collateral flow into the visualized RIGHT M2 and M3 branches. Mild nonstenotic extracranial and intracranial atherosclerotic change. These results were called by telephone at the time of interpretation on 05/18/2018 at 5:30 pm to Dr. Amie Portland , who verbally acknowledged these results. Electronically Signed  By: Staci Righter M.D.   On: 05/18/2018 17:52   Ct Angio Neck W Or Wo Contrast  Result Date: 05/18/2018 CLINICAL DATA:  Focal neuro deficit, LEFT hemiparesis, RIGHT gaze preference. Last seen normal 1600 hours earlier today EXAM: CT ANGIOGRAPHY HEAD AND NECK TECHNIQUE: Multidetector CT imaging of the head and neck was performed using the standard protocol during bolus administration of intravenous  contrast. Multiplanar CT image reconstructions and MIPs were obtained to evaluate the vascular anatomy. Carotid stenosis measurements (when applicable) are obtained utilizing NASCET criteria, using the distal internal carotid diameter as the denominator. CONTRAST:  31mL ISOVUE-370 IOPAMIDOL (ISOVUE-370) INJECTION 76% COMPARISON:  Code stroke CT earlier in the day. FINDINGS: CTA NECK FINDINGS Aortic arch: Standard branching. Imaged portion shows no evidence of aneurysm or dissection. No significant stenosis of the major arch vessel origins. Right carotid system: No evidence of dissection, stenosis (50% or greater) or occlusion. Left carotid system: No evidence of dissection, stenosis (50% or greater) or occlusion. Minor atheromatous change. Vertebral arteries: BILATERAL patent, LEFT dominant. Calcific plaque at the origin of the LEFT vertebral could result in 50% stenosis. Skeleton: Spondylosis. Some missing teeth. No worrisome osseous lesion. Other neck: No neck masses.  Normal thyroid.  RIGHT gaze preference. Upper chest: No lung apex lesion or pneumothorax. Review of the MIP images confirms the above findings CTA HEAD FINDINGS Anterior circulation: BILATERAL cavernous carotid calcifications, less than 50% stenosis. Acute distal RIGHT M1 MCA occlusion, extending into the bifurcation. Poor collateral flow into the visualized RIGHT M2 and M3 branches. Patent LEFT MCA territory. Both anterior cerebral artery segments are widely patent. Posterior circulation: Basilar artery widely patent. LEFT vertebral dominant. No PCA or cerebellar stenosis or occlusion. No saccular aneurysm. Venous sinuses: As permitted by contrast timing, patent. Anatomic variants: None of significance. Delayed phase: Not performed. Review of the MIP images confirms the above findings IMPRESSION: Acute distal RIGHT M1 MCA occlusion, likely embolus. Poor collateral flow into the visualized RIGHT M2 and M3 branches. Mild nonstenotic extracranial and  intracranial atherosclerotic change. These results were called by telephone at the time of interpretation on 05/18/2018 at 5:30 pm to Dr. Amie Portland , who verbally acknowledged these results. Electronically Signed   By: Staci Righter M.D.   On: 05/18/2018 17:52   Mr Brain Wo Contrast  Result Date: 05/19/2018 CLINICAL DATA:  47 year old male found to have distal right M1 occlusion, status post catheter directed revascularization with TICI 3 reperfusion. EXAM: MRI HEAD WITHOUT CONTRAST TECHNIQUE: Multiplanar, multiecho pulse sequences of the brain and surrounding structures were obtained without intravenous contrast. COMPARISON:  Prior CTA and CT from 05/18/2018 FINDINGS: Brain: Cerebral volume within normal limits for age. No significant cerebral white matter changes for age. Small remote right cortical infarct at the right parietal lobe (series 11, image 18). Mildly increased diffusion signal abnormality at the right insula and right frontal operculum without frank restriction (series 5, image 61). Single punctate focus of restricted diffusion more superiorly within the posterior right frontal region (series 5, image 70). No other evidence for acute or subacute ischemia. Gray-white matter differentiation otherwise maintained. No acute or chronic intracranial hemorrhage. No mass lesion, midline shift or mass effect. No hydrocephalus. No extra-axial fluid collection. Pituitary gland normal. Vascular: Major intracranial vascular flow voids are maintained. Skull and upper cervical spine: Craniocervical junction normal. Upper cervical spine normal. Bone marrow signal intensity normal. No scalp soft tissue abnormality. Sinuses/Orbits: Globes and orbital soft tissues within normal limits. Scattered mucosal thickening throughout the paranasal  sinuses. Trace opacity left mastoid air cells, of doubtful significance. Inner ear structures normal. Other: None. IMPRESSION: 1. Mildly increased diffusion abnormality within the  right insula and right frontal operculum without frank restriction, consistent with mild ischemic insult. Additional punctate cortical infarct more superiorly within the posterior right frontal region. No other evidence for acute ischemia. 2. 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 3. Otherwise negative brain MRI. Electronically Signed   By: Jeannine Boga M.D.   On: 05/19/2018 05:42   Dg Chest Port 1 View  Result Date: 05/19/2018 CLINICAL DATA:  Ischemic stroke. EXAM: PORTABLE CHEST 1 VIEW COMPARISON:  10/05/2014 FINDINGS: Cardiac enlargement. No vascular congestion. Increased density in the right lower lung likely representing focal pneumonia. No blunting of costophrenic angles. No pneumothorax. Mediastinal contours appear intact. IMPRESSION: Cardiac enlargement. Focal infiltration in the right lower lung likely representing pneumonia. Electronically Signed   By: Lucienne Capers M.D.   On: 05/19/2018 00:37   Ct Head Code Stroke Wo Contrast`  Result Date: 05/18/2018 CLINICAL DATA:  Code stroke.  LEFT-sided weakness.  Aphasia. EXAM: CT HEAD WITHOUT CONTRAST TECHNIQUE: Contiguous axial images were obtained from the base of the skull through the vertex without intravenous contrast. COMPARISON:  None. FINDINGS: The patient was unable to remain motionless for the exam. Small or subtle lesions could be overlooked. Brain: Slight hypoattenuation of the RIGHT insular ribbon as compared to the LEFT, could represent early ischemia. No hemorrhage, mass lesion, hydrocephalus, or extra-axial fluid. Normal for age cerebral volume. No definite white matter disease. Vascular: There is Hounsfield artifact and mild motion which obscure the slices through the circle of Willis. However, the RIGHT MCA is questionably more dense than the LEFT, as seen on coronal series 5 image 25 and sagittal series 6, image 25. Skull: Normal. Negative for fracture or focal lesion. Sinuses/Orbits: No acute finding.  Other: None. ASPECTS [facility] Stroke Program Early CT Score) - Ganglionic level infarction (caudate, lentiform nuclei, internal capsule, insula, M1-M3 cortex): 6 - Supraganglionic infarction (M4-M6 cortex): 3 Total score (0-10 with 10 being normal): 9 IMPRESSION: 1. Somewhat reduced quality exam demonstrating equivocal hypoattenuation of the RIGHT insular ribbon is compared to the LEFT. RIGHT MCA may be questionably more dense than LEFT, on multiplanar reformations. 2. ASPECTS is 9. These results were communicated to Dr. Rory Percy at 5:21 pmon 7/21/2019by text page via the [facility] messaging system. Electronically Signed   By: Staci Righter M.D.   On: 05/18/2018 17:22    Labs:  CBC: Recent Labs    12/01/17 1311 05/18/18 1703 05/18/18 1715 05/19/18 0233  WBC 3.7*  --  5.5 4.7  HGB 15.5 18.4* 16.3 15.0  HCT 45.2 54.0* 50.4 46.4  PLT 185  --  315 266    COAGS: Recent Labs    05/18/18 1715  INR 1.31  APTT 27    BMP: Recent Labs    12/01/17 1311 05/18/18 1703 05/18/18 1715 05/19/18 0233  NA 131* 137 136 136  K 3.7 4.5 4.6 3.8  CL 96* 101 101 103  CO2 22  --  20* 25  GLUCOSE 92 101* 103* 97  BUN 10 10 7 6   CALCIUM 9.0  --  8.8* 8.6*  CREATININE 0.96 0.90 1.04 0.96  GFRNONAA >60  --  >60 >60  GFRAA >60  --  >60 >60    LIVER FUNCTION TESTS: Recent Labs    12/01/17 1311 05/18/18 1715  BILITOT 2.8* 2.5*  AST 50* 48*  ALT 30  28  ALKPHOS 195* 202*  PROT 7.9 7.0  ALBUMIN 3.6 3.2*    Assessment and Plan:  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 Estanislado Pandy. Patient stable without focal neurological deficits demonstrated- can spontaneously move all extremities. Right groin incision stable. Appreciate and agree with neurology management.   Electronically Signed: Earley Abide, PA-C 05/19/2018, 9:00 AM   I spent a total of 15 Minutes at the the patient's bedside AND on the patient's hospital floor or unit, greater than 50% of which was  counseling/coordinating care for right MCA M1 segment occlusion s/p revascularization.

## 2018-05-20 ENCOUNTER — Inpatient Hospital Stay (HOSPITAL_COMMUNITY): Payer: Medicare Other

## 2018-05-20 DIAGNOSIS — I6601 Occlusion and stenosis of right middle cerebral artery: Secondary | ICD-10-CM

## 2018-05-20 DIAGNOSIS — Z823 Family history of stroke: Secondary | ICD-10-CM

## 2018-05-20 DIAGNOSIS — I10 Essential (primary) hypertension: Secondary | ICD-10-CM | POA: Diagnosis present

## 2018-05-20 DIAGNOSIS — E785 Hyperlipidemia, unspecified: Secondary | ICD-10-CM | POA: Diagnosis present

## 2018-05-20 DIAGNOSIS — I509 Heart failure, unspecified: Secondary | ICD-10-CM

## 2018-05-20 DIAGNOSIS — Z720 Tobacco use: Secondary | ICD-10-CM | POA: Diagnosis present

## 2018-05-20 MED ORDER — ATORVASTATIN CALCIUM 10 MG PO TABS: 10.0000 mg | ORAL_TABLET | Freq: Every day | ORAL | 2 refills | Status: DC

## 2018-05-20 NOTE — Progress Notes (Signed)
Discharge instructions given to patient. All questions answered. No distress noted

## 2018-05-20 NOTE — Progress Notes (Addendum)
Advanced Heart Failure Rounding Note  PCP-Cardiologist:Dr Benismhon  Subjective:    Feeling Ok this am. Would like to go home. Denies SOB, dizziness, or lightheadedness. Strength feels about equal bilaterally.   Stroke symptoms nearly resolved except for very mild left facial droop. Remains in AF with rate control   ECHO 05/19/2018  Left ventricle: The cavity size was normal. There was moderate   concentric hypertrophy. Systolic function was normal. The   estimated ejection fraction was in the range of 55% to 60%. Wall   motion was normal; there were no regional wall motion   abnormalities. - Mitral valve: Mild, late systolicprolapse, involving the anterior   leaflet and the posterior leaflet. There was mild to moderate   regurgitation directed centrally. - Left atrium: The atrium was moderately to severely dilated. - Right ventricle: Systolic function was moderately reduced. - Right atrium: The atrium was severely dilated. - Pulmonary arteries: Systolic pressure was moderately increased.   PA peak pressure: 68 mm Hg (S). Impressions: - Compared to the 2016 study, left ventricular systolic function   has normalized, but there is persistent pulmonary artery   hypertension and there is evidence of right ventricular   dysfunction and increased right atrial pressure.   Objective:   Weight Range: 163 lb 2.3 oz (74 kg) Body mass index is 24.81 kg/m.   Vital Signs:   Temp:  [97.6 F (36.4 C)-98.4 F (36.9 C)] 97.6 F (36.4 C) (07/23 0400) Pulse Rate:  [38-90] 64 (07/23 0800) Resp:  [14-23] 20 (07/23 0800) BP: (90-121)/(63-88) 101/77 (07/23 0800) SpO2:  [93 %-100 %] 94 % (07/23 0800) Arterial Line BP: (109-120)/(70-81) 120/81 (07/22 1000) Last BM Date: 05/18/18  Weight change: Filed Weights   05/18/18 1905  Weight: 163 lb 2.3 oz (74 kg)    Intake/Output:   Intake/Output Summary (Last 24 hours) at 05/20/2018 0845 Last data filed at 05/20/2018 0800 Gross per 24  hour  Intake 1735.81 ml  Output 554 ml  Net 1181.81 ml      Physical Exam    General: NAD HEENT: Normal ? Slight left facial droop  Neck: Supple. JVP 5-6. Carotids 2+ bilat; no bruits. No thyromegaly or nodule noted. Cor: PMI nondisplaced. Irregularly irregular. No M/G/R noted Lungs: CTAB, normal effort. Abdomen: Soft, non-tender, non-distended, no HSM. No bruits or masses. +BS  Extremities: No cyanosis, clubbing, or rash. R and LLE no edema.  Neuro: Alert & orientedx3, cranial nerves grossly intact. moves all 4 extremities w/o difficulty. Affect pleasant   Telemetry   Afib 70s, personally reviewed.   EKG    No new tracings.    Labs    CBC Recent Labs    05/18/18 1715 05/19/18 0233  WBC 5.5 4.7  NEUTROABS 2.1 2.9  HGB 16.3 15.0  HCT 50.4 46.4  MCV 91.3 91.2  PLT 315 527   Basic Metabolic Panel Recent Labs    05/18/18 1715 05/19/18 0233  NA 136 136  K 4.6 3.8  CL 101 103  CO2 20* 25  GLUCOSE 103* 97  BUN 7 6  CREATININE 1.04 0.96  CALCIUM 8.8* 8.6*   Liver Function Tests Recent Labs    05/18/18 1715  AST 48*  ALT 28  ALKPHOS 202*  BILITOT 2.5*  PROT 7.0  ALBUMIN 3.2*   No results for input(s): LIPASE, AMYLASE in the last 72 hours. Cardiac Enzymes No results for input(s): CKTOTAL, CKMB, CKMBINDEX, TROPONINI in the last 72 hours.  BNP: BNP (last 3 results)  No results for input(s): BNP in the last 8760 hours.  ProBNP (last 3 results) No results for input(s): PROBNP in the last 8760 hours.   D-Dimer No results for input(s): DDIMER in the last 72 hours. Hemoglobin A1C Recent Labs    05/19/18 0233  HGBA1C 6.0*   Fasting Lipid Panel Recent Labs    05/19/18 0233  CHOL 122  HDL 32*  LDLCALC 71  TRIG 95  CHOLHDL 3.8   Thyroid Function Tests No results for input(s): TSH, T4TOTAL, T3FREE, THYROIDAB in the last 72 hours.  Invalid input(s): FREET3  Other results:   Imaging    Dg Chest Port 1 View  Result Date:  05/20/2018 CLINICAL DATA:  Cough, CHF, acute CVA, right lower lobe infiltrate EXAM: PORTABLE CHEST 1 VIEW COMPARISON:  Portable chest x-ray of May 18, 2018 FINDINGS: The lungs are mildly hyperinflated. There is persistent increased density in the right lower hemithorax with small right pleural effusion. The cardiac silhouette is enlarged. The pulmonary vascularity is mildly prominent but stable. The observed bony thorax is unremarkable. IMPRESSION: COPD. Persistent infiltrate or atelectasis in the right lower lobe with small right pleural effusion. Low-grade compensated CHF. Electronically Signed   By: David  Martinique M.D.   On: 05/20/2018 08:05     Medications:     Scheduled Medications: . apixaban  5 mg Oral BID  . atorvastatin  10 mg Oral q1800  . carvedilol  3.125 mg Oral BID WC  . levothyroxine  25 mcg Oral QAC breakfast  . sacubitril-valsartan  1 tablet Oral BID    Infusions: . sodium chloride    . sodium chloride 75 mL/hr at 05/20/18 0800  . niCARDipine      PRN Medications: acetaminophen **OR** acetaminophen (TYLENOL) oral liquid 160 mg/5 mL **OR** acetaminophen, fentaNYL (SUBLIMAZE) injection, iohexol, iohexol, senna-docusate    Patient Profile   Charles Hawkins is a 47 y/o male with h/o HTN, h/o bilateral club feet s/p surgical repair, gout, arthritis and restrictive cardiomyopathy.  Admitted with  Left sided weakness and slurred speech. He was emergently taken to the interventional suite with endovascular revascularization of his vessel and successful reperfusion. He was not considered a candidate for TPA due to him being on Eliquis.   Assessment/Plan   1. Acute Embolic Stroke  - Stroke Team managing. Missed one dose of eliquis prior to admit.  - He underwent emergent CTA found a right M1 MCA occlusion consistent with an embolic stroke. He was urgently taken to the interventional suite for possible endovascular revascularization of the right MCA. Success was achieved  with x 1 pass with 58mm x 69mm embotrap retrieve device achieving a TICI 3 reperfusion. - Discussed with Neurology at bedside 05/19/18 - Restarted eliquis. Statin added.   2. Restrictive Cardiomyopathy  - Followed by [facility] for possible transplant. He has had transplant evaluation and was not listed due to tobacco and THC use.  - Last seen 11/2017 by [facility] - Echo 05/19/18 EF improved to 55-60%.  - He is hesitant to restart carvedilol. Dr Haroldine Laws will discuss with Dr Posey Pronto at [facility]    - Volume status stable.  - Creatinine stable.  - Prior to admit he was not on carvedilol or entresto due to intolerance. He has not been on in several months.    3. A fibrillation -  - Amiodarone was stopped by his PCP due to low BP and hypothyroidism. Looks like he has been in A flutter at his last visit with Dr Posey Pronto 11/2017.  There was discussion about A flutter ablation but does not look like he had that appointment. Rate controlled.   - Hold off on amio. Would like to get follow up with EP.   - Continue eliquis 5 mg twice a day.   4. THC  - Noted on drug screen. Encouraged cessation.   5. Hypothyroidism - Continue synthroid 25 mcg daily. (Home dose)   Follow-up with [facility] cardiologist Posey Pronto, Gweneth Fritter, MD) was scheduled for 06/26/2018  Medication concerns reviewed with patient and pharmacy team. Barriers identified: None   Length of Stay: 2  Shirley Friar, PA-C  05/20/2018, 8:45 AM  Advanced Heart Failure Team Pager [phone] (M-F; 7a - 4p)  Please contact [facility] for night-coverage after hours (4p -7a ) and weekends on [email or web address]  Patient seen and examined with the above-signed Advanced Practice Provider and/or Housestaff. I personally reviewed laboratory data, imaging studies and relevant notes. I independently examined the patient and formulated the important aspects of the plan. I have edited the note to reflect any of my changes or salient points. I have personally  discussed the plan with the patient and/or family.  Doing well from CVA perspective with minimal or no residual. Remains in rate controlled AF. Back on Eliquis. On ECHO LV function now back to normal. He would like to hold off on Entresto and carvedilol until he talks to Dr. Posey Pronto. I have discussed with Dr. Posey Pronto at [facility] and updated him on situation. He will f/u in August. Ok for d/c from our standpoint.    Heart failure team will sign off as of 05/20/18  HF Medication Recommendations for Home: Eliquis 5 bid Lasix 40/20 as on previously.   Refuses starting back on Entresto or Carvedilol at this point.     Follow up as an outpatient:Dr. Posey Pronto at [facility].   Glori Bickers, MD  2:40 PM

## 2018-05-20 NOTE — Anesthesia Postprocedure Evaluation (Signed)
Anesthesia Post Note  Patient: Charles Hawkins  Procedure(s) Performed: RADIOLOGY WITH ANESTHESIA (N/A )     Patient location during evaluation: PACU Anesthesia Type: General Level of consciousness: awake and alert Pain management: pain level controlled Vital Signs Assessment: post-procedure vital signs reviewed and stable Respiratory status: spontaneous breathing, nonlabored ventilation, respiratory function stable and patient connected to nasal cannula oxygen Cardiovascular status: blood pressure returned to baseline and stable Postop Assessment: no apparent nausea or vomiting Anesthetic complications: no    Last Vitals:  Vitals:   05/20/18 1000 05/20/18 1100  BP: 102/69 96/67  Pulse: 80 (!) 25  Resp: (!) 22 19  Temp:    SpO2: 96% 97%    Last Pain:  Vitals:   05/20/18 0800  TempSrc: Oral  PainSc: 0-No pain                 [name]

## 2018-05-20 NOTE — Discharge Summary (Addendum)
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 Arthritis   . CHF (congestive heart failure) ([place])   . HTN (hypertension)   . 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 Multiple areas of subendocardial enhancement on MRI. Fat pad and myocardial biopsy negative at [facility].    Past Surgical History:  Procedure Laterality Date  . RADIOLOGY WITH ANESTHESIA N/A 05/18/2018   Procedure: RADIOLOGY WITH ANESTHESIA;  Surgeon: Luanne Bras, MD;  Location: [place];  Service: Radiology;  Laterality: N/A;    Allergies as of 05/20/2018      Reactions   Lyrica [pregabalin] Anaphylaxis   Nsaids Other (See Comments)      Medication List    STOP taking these medications   amiodarone 100 MG tablet Commonly known as:  PACERONE   amiodarone 200 MG tablet Commonly known as:  PACERONE   carvedilol 6.25 MG tablet Commonly known as:  COREG   nicotine 7 mg/24hr patch Commonly known as:  NICODERM CQ - dosed in mg/24 hr   sacubitril-valsartan 24-26 MG Commonly known as:  ENTRESTO     TAKE these medications   atorvastatin 10 MG tablet Commonly known as:  LIPITOR Take 1 tablet (10  mg total) by mouth daily at 6 PM.   buPROPion 150 MG 12 hr tablet Commonly known as:  WELLBUTRIN SR Take 1 tablet (150 mg total) by mouth 2 (two) times daily. What changed:    when to take this  additional instructions   colchicine 0.6 MG tablet Take 1 tablet (0.6 mg total) by mouth daily. 1-2 times daily What changed:    when to take this  additional instructions   ELIQUIS 5 MG Tabs tablet Generic drug:  apixaban TAKE 1 TABLET (5 MG TOTAL) BY MOUTH 2 (TWO) TIMES DAILY. What changed:  See the new instructions.   furosemide 40 MG tablet Commonly known as:  LASIX Take 40 mg (1 tablet) in the morningTake 20 mg ( 1/2 tablet) in the evening What changed:    how much to take  how to take this  when to take this  additional instructions   levothyroxine 25 MCG tablet Commonly known as:  SYNTHROID, LEVOTHROID Take 25 mcg by mouth daily at 12 noon.   multivitamin with minerals Tabs tablet Take 1 tablet by mouth daily at 12 noon.   pantoprazole 40 MG tablet Commonly known as:  PROTONIX Take 40 mg by mouth daily at 12 noon.   potassium chloride 10 MEQ CR capsule Commonly known as:  MICRO-K TAKE 2 TABLETS (20 MEQ TOTAL) BY MOUTH DAILY. What changed:    how much to take  how to take this  when to  take this  additional instructions       LABORATORY STUDIES CBC    Component Value Date/Time   WBC 4.7 05/19/2018 0233   RBC 5.09 05/19/2018 0233   HGB 15.0 05/19/2018 0233   HCT 46.4 05/19/2018 0233   PLT 266 05/19/2018 0233   MCV 91.2 05/19/2018 0233   MCH 29.5 05/19/2018 0233   MCHC 32.3 05/19/2018 0233   RDW 15.4 05/19/2018 0233   LYMPHSABS 1.1 05/19/2018 0233   MONOABS 0.7 05/19/2018 0233   EOSABS 0.0 05/19/2018 0233   BASOSABS 0.0 05/19/2018 0233   CMP    Component Value Date/Time   NA 136 05/19/2018 0233   K 3.8 05/19/2018 0233   CL 103 05/19/2018 0233   CO2 25 05/19/2018 0233   GLUCOSE 97 05/19/2018 0233   BUN 6 05/19/2018 0233   CREATININE  0.96 05/19/2018 0233   CALCIUM 8.6 (L) 05/19/2018 0233   PROT 7.0 05/18/2018 1715   ALBUMIN 3.2 (L) 05/18/2018 1715   AST 48 (H) 05/18/2018 1715   ALT 28 05/18/2018 1715   ALKPHOS 202 (H) 05/18/2018 1715   BILITOT 2.5 (H) 05/18/2018 1715   GFRNONAA >60 05/19/2018 0233   GFRAA >60 05/19/2018 0233   COAGS Lab Results  Component Value Date   INR 1.31 05/18/2018   INR 1.46 10/17/2016   Lipid Panel    Component Value Date/Time   CHOL 122 05/19/2018 0233   TRIG 95 05/19/2018 0233   HDL 32 (L) 05/19/2018 0233   CHOLHDL 3.8 05/19/2018 0233   VLDL 19 05/19/2018 0233   LDLCALC 71 05/19/2018 0233   HgbA1C  Lab Results  Component Value Date   HGBA1C 6.0 (H) 05/19/2018   Urinalysis    Component Value Date/Time   COLORURINE STRAW (A) 05/18/2018 1811   APPEARANCEUR CLEAR 05/18/2018 1811   LABSPEC 1.011 05/18/2018 1811   PHURINE 5.0 05/18/2018 1811   GLUCOSEU NEGATIVE 05/18/2018 1811   HGBUR SMALL (A) 05/18/2018 1811   BILIRUBINUR NEGATIVE 05/18/2018 1811   KETONESUR NEGATIVE 05/18/2018 1811   PROTEINUR NEGATIVE 05/18/2018 1811   NITRITE NEGATIVE 05/18/2018 1811   LEUKOCYTESUR NEGATIVE 05/18/2018 1811   Urine Drug Screen     Component Value Date/Time   LABOPIA NONE DETECTED 05/18/2018 1811   COCAINSCRNUR NONE DETECTED 05/18/2018 1811   LABBENZ NONE DETECTED 05/18/2018 1811   AMPHETMU NONE DETECTED 05/18/2018 1811   THCU POSITIVE (A) 05/18/2018 1811   LABBARB NONE DETECTED 05/18/2018 1811    Alcohol Level    Component Value Date/Time   ETH 43 (H) 05/18/2018 1714     SIGNIFICANT DIAGNOSTIC STUDIES Ct Angio Head W Or Wo Contrast  Result Date: 05/18/2018 CLINICAL DATA:  Focal neuro deficit, LEFT hemiparesis, RIGHT gaze preference. Last seen normal 1600 hours earlier today EXAM: CT ANGIOGRAPHY HEAD AND NECK TECHNIQUE: Multidetector CT imaging of the head and neck was performed using the standard protocol during bolus administration of intravenous contrast. Multiplanar CT  image reconstructions and MIPs were obtained to evaluate the vascular anatomy. Carotid stenosis measurements (when applicable) are obtained utilizing NASCET criteria, using the distal internal carotid diameter as the denominator. CONTRAST:  69mL ISOVUE-370 IOPAMIDOL (ISOVUE-370) INJECTION 76% COMPARISON:  Code stroke CT earlier in the day. FINDINGS: CTA NECK FINDINGS Aortic arch: Standard branching. Imaged portion shows no evidence of aneurysm or dissection. No significant stenosis of the major arch vessel origins. Right carotid system: No evidence of dissection, stenosis (50% or greater) or occlusion. Left carotid system: No evidence of dissection, stenosis (  50% or greater) or occlusion. Minor atheromatous change. Vertebral arteries: BILATERAL patent, LEFT dominant. Calcific plaque at the origin of the LEFT vertebral could result in 50% stenosis. Skeleton: Spondylosis. Some missing teeth. No worrisome osseous lesion. Other neck: No neck masses.  Normal thyroid.  RIGHT gaze preference. Upper chest: No lung apex lesion or pneumothorax. Review of the MIP images confirms the above findings CTA HEAD FINDINGS Anterior circulation: BILATERAL cavernous carotid calcifications, less than 50% stenosis. Acute distal RIGHT M1 MCA occlusion, extending into the bifurcation. Poor collateral flow into the visualized RIGHT M2 and M3 branches. Patent LEFT MCA territory. Both anterior cerebral artery segments are widely patent. Posterior circulation: Basilar artery widely patent. LEFT vertebral dominant. No PCA or cerebellar stenosis or occlusion. No saccular aneurysm. Venous sinuses: As permitted by contrast timing, patent. Anatomic variants: None of significance. Delayed phase: Not performed. Review of the MIP images confirms the above findings IMPRESSION: Acute distal RIGHT M1 MCA occlusion, likely embolus. Poor collateral flow into the visualized RIGHT M2 and M3 branches. Mild nonstenotic extracranial and intracranial  atherosclerotic change. These results were called by telephone at the time of interpretation on 05/18/2018 at 5:30 pm to Dr. Amie Portland , who verbally acknowledged these results. Electronically Signed   By: Staci Righter M.D.   On: 05/18/2018 17:52   Ct Angio Neck W Or Wo Contrast  Result Date: 05/18/2018 CLINICAL DATA:  Focal neuro deficit, LEFT hemiparesis, RIGHT gaze preference. Last seen normal 1600 hours earlier today EXAM: CT ANGIOGRAPHY HEAD AND NECK TECHNIQUE: Multidetector CT imaging of the head and neck was performed using the standard protocol during bolus administration of intravenous contrast. Multiplanar CT image reconstructions and MIPs were obtained to evaluate the vascular anatomy. Carotid stenosis measurements (when applicable) are obtained utilizing NASCET criteria, using the distal internal carotid diameter as the denominator. CONTRAST:  61mL ISOVUE-370 IOPAMIDOL (ISOVUE-370) INJECTION 76% COMPARISON:  Code stroke CT earlier in the day. FINDINGS: CTA NECK FINDINGS Aortic arch: Standard branching. Imaged portion shows no evidence of aneurysm or dissection. No significant stenosis of the major arch vessel origins. Right carotid system: No evidence of dissection, stenosis (50% or greater) or occlusion. Left carotid system: No evidence of dissection, stenosis (50% or greater) or occlusion. Minor atheromatous change. Vertebral arteries: BILATERAL patent, LEFT dominant. Calcific plaque at the origin of the LEFT vertebral could result in 50% stenosis. Skeleton: Spondylosis. Some missing teeth. No worrisome osseous lesion. Other neck: No neck masses.  Normal thyroid.  RIGHT gaze preference. Upper chest: No lung apex lesion or pneumothorax. Review of the MIP images confirms the above findings CTA HEAD FINDINGS Anterior circulation: BILATERAL cavernous carotid calcifications, less than 50% stenosis. Acute distal RIGHT M1 MCA occlusion, extending into the bifurcation. Poor collateral flow into the  visualized RIGHT M2 and M3 branches. Patent LEFT MCA territory. Both anterior cerebral artery segments are widely patent. Posterior circulation: Basilar artery widely patent. LEFT vertebral dominant. No PCA or cerebellar stenosis or occlusion. No saccular aneurysm. Venous sinuses: As permitted by contrast timing, patent. Anatomic variants: None of significance. Delayed phase: Not performed. Review of the MIP images confirms the above findings IMPRESSION: Acute distal RIGHT M1 MCA occlusion, likely embolus. Poor collateral flow into the visualized RIGHT M2 and M3 branches. Mild nonstenotic extracranial and intracranial atherosclerotic change. These results were called by telephone at the time of interpretation on 05/18/2018 at 5:30 pm to Dr. Amie Portland , who verbally acknowledged these results. Electronically Signed   By: Roderic Ovens.D.  On: 05/18/2018 17:52   Mr Brain Wo Contrast  Result Date: 05/19/2018 CLINICAL DATA:  47 year old male found to have distal right M1 occlusion, status post catheter directed revascularization with TICI 3 reperfusion. EXAM: MRI HEAD WITHOUT CONTRAST TECHNIQUE: Multiplanar, multiecho pulse sequences of the brain and surrounding structures were obtained without intravenous contrast. COMPARISON:  Prior CTA and CT from 05/18/2018 FINDINGS: Brain: Cerebral volume within normal limits for age. No significant cerebral white matter changes for age. Small remote right cortical infarct at the right parietal lobe (series 11, image 18). Mildly increased diffusion signal abnormality at the right insula and right frontal operculum without frank restriction (series 5, image 61). Single punctate focus of restricted diffusion more superiorly within the posterior right frontal region (series 5, image 70). No other evidence for acute or subacute ischemia. Gray-white matter differentiation otherwise maintained. No acute or chronic intracranial hemorrhage. No mass lesion, midline shift or mass  effect. No hydrocephalus. No extra-axial fluid collection. Pituitary gland normal. Vascular: Major intracranial vascular flow voids are maintained. Skull and upper cervical spine: Craniocervical junction normal. Upper cervical spine normal. Bone marrow signal intensity normal. No scalp soft tissue abnormality. Sinuses/Orbits: Globes and orbital soft tissues within normal limits. Scattered mucosal thickening throughout the paranasal sinuses. Trace opacity left mastoid air cells, of doubtful significance. Inner ear structures normal. Other: None. IMPRESSION: 1. Mildly increased diffusion abnormality within the right insula and right frontal operculum without frank restriction, consistent with mild ischemic insult. Additional punctate cortical infarct more superiorly within the posterior right frontal region. No other evidence for acute ischemia. 2. 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 3. Otherwise negative brain MRI. Electronically Signed   By: Jeannine Boga M.D.   On: 05/19/2018 05:42   Dg Chest Port 1 View  Result Date: 05/20/2018 CLINICAL DATA:  Cough, CHF, acute CVA, right lower lobe infiltrate EXAM: PORTABLE CHEST 1 VIEW COMPARISON:  Portable chest x-ray of May 18, 2018 FINDINGS: The lungs are mildly hyperinflated. There is persistent increased density in the right lower hemithorax with small right pleural effusion. The cardiac silhouette is enlarged. The pulmonary vascularity is mildly prominent but stable. The observed bony thorax is unremarkable. IMPRESSION: COPD. Persistent infiltrate or atelectasis in the right lower lobe with small right pleural effusion. Low-grade compensated CHF. Electronically Signed   By: David  Martinique M.D.   On: 05/20/2018 08:05   Dg Chest Port 1 View  Result Date: 05/19/2018 CLINICAL DATA:  Ischemic stroke. EXAM: PORTABLE CHEST 1 VIEW COMPARISON:  10/05/2014 FINDINGS: Cardiac enlargement. No vascular congestion. Increased density in the right  lower lung likely representing focal pneumonia. No blunting of costophrenic angles. No pneumothorax. Mediastinal contours appear intact. IMPRESSION: Cardiac enlargement. Focal infiltration in the right lower lung likely representing pneumonia. Electronically Signed   By: Lucienne Capers M.D.   On: 05/19/2018 00:37   Ct Head Code Stroke Wo Contrast`  Result Date: 05/18/2018 CLINICAL DATA:  Code stroke.  LEFT-sided weakness.  Aphasia. EXAM: CT HEAD WITHOUT CONTRAST TECHNIQUE: Contiguous axial images were obtained from the base of the skull through the vertex without intravenous contrast. COMPARISON:  None. FINDINGS: The patient was unable to remain motionless for the exam. Small or subtle lesions could be overlooked. Brain: Slight hypoattenuation of the RIGHT insular ribbon as compared to the LEFT, could represent early ischemia. No hemorrhage, mass lesion, hydrocephalus, or extra-axial fluid. Normal for age cerebral volume. No definite white matter disease. Vascular: There is Hounsfield artifact and mild motion which obscure  the slices through the circle of Willis. However, the RIGHT MCA is questionably more dense than the LEFT, as seen on coronal series 5 image 25 and sagittal series 6, image 25. Skull: Normal. Negative for fracture or focal lesion. Sinuses/Orbits: No acute finding. Other: None. ASPECTS [facility] Stroke Program Early CT Score) - Ganglionic level infarction (caudate, lentiform nuclei, internal capsule, insula, M1-M3 cortex): 6 - Supraganglionic infarction (M4-M6 cortex): 3 Total score (0-10 with 10 being normal): 9 IMPRESSION: 1. Somewhat reduced quality exam demonstrating equivocal hypoattenuation of the RIGHT insular ribbon is compared to the LEFT. RIGHT MCA may be questionably more dense than LEFT, on multiplanar reformations. 2. ASPECTS is 9. These results were communicated to Dr. Rory Percy at 5:21 pmon 7/21/2019by text page via the [facility] messaging system. Electronically Signed   By: Staci Righter M.D.   On: 05/18/2018 17:22    Cerebral angiogram  S/P RT common carotid arteriogram followed by endovascular complete revascularization of occluded RT MCA M 1 seg With x 1 pass with 53mm x 77mm embotrap retrieve device achieving a TICI 3 Reperfusion  2D Echocardiogram  - Left ventricle: The cavity size was normal. There was moderateconcentric hypertrophy. Systolic function was normal. Theestimated ejection fraction was in the range of 55% to 60%. Wallmotion was normal; there were no regional wall motionabnormalities. - Mitral valve: Mild, late systolicprolapse, involving the anteriorleaflet and the posterior leaflet. There was mild to moderateregurgitation directed centrally. - Left atrium: The atrium was moderately to severely dilated. - Right ventricle: Systolic function was moderately reduced. - Right atrium: The atrium was severely dilated. - Pulmonary arteries: Systolic pressure was moderately increased.PA peak pressure: 68 mm Hg (S). Impressions:   Compared to the 2016 study, left ventricular systolic functionhas normalized, but there is persistent pulmonary arteryhypertension and there is evidence of right ventriculardysfunction and increased right atrial pressure.   HISTORY OF PRESENT ILLNESS Paula Holemanis a 47 y.o.malewith a past medical history of restrictive cardiomyopathy on Eliquis, who has also undergone evaluation for cardiac transplant but is not on the transplant list, hypertension, who was in his usual state of health until 4:05 PM 05/18/2018 ([facility]) when he was noted to have sudden onset of left-sided weakness rightward gaze and slurred speech. No preceding illnesses have been reported. He has not had a stroke in his life before. He took his last dose of Eliquis at noon 05/18/2018. Reports compliance to all his medications (though was found to have missed a dose of Eliquis 05/17/2018 at 2p while out shopping). He follows with multiple doctors at [facility], [facility] and [facility]. His wife and sister arrived at bedside and provided and confirmed the history that was initially provided by EMS. He was taken for a Noncon CT of the head that showed a dense MCA sign and aspect score of9.CT angiogram head and neck showed a right M1 cutoff. An endovascular consultation was obtained emergently and decision was made to take him for emergent endovascular thrombectomy. He was not a candidate for TPA due to last dose of Eliquis being taken just a few hours prior to presentation. Premorbid modified Rankin scale (mRS):1   HOSPITAL COURSE Mr. Trice Aspinall is a 47 y.o. 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 No tPA d/t Eliquis use. Found to have an occluded  R M1 MCA s/p endovascular therapy w/ TICI3 reperfusion. NIHSS back to baseline post procedure. Therapy only recommended home assessment for safety. Cardiology/CHF was consulted with recommendations for meds and followup. D/c home back on Eliquis.   Stroke:   R insular, R frontal operculum, punctate posterior R frontal cortical infarct embolic secondary to known AF with R M1 occlusion s/p endovascular therapy with TICI 3 in setting of 1 missed dose of AC   Code Stroke CT head hypoattenuation are insular ribbon, R MCA more dense than L. ASPECTS 9    CTA head & neck distal rM1 MCA occlusion.  Poor collateral flow.  Mild atherosclerosis.  Cerebral angiogram occluded R M1 MCA.  TICI3 reperfusion  Post IR CT stable per neuroradiologist  MRI  R insular, R frontal operculum, punctate posterior R frontal cortical infarct. Old high R parietal cortical infarct.  2D Echo EF 55 to 60%.  LA moderately to severely dilated.  RA  severely dilated.  Compared to 4098, LV systolic function normalized but there is persistent pulmonary artery hypertension and right ventricular dysfunction and increased right atrial pressure.  LDL 71  HgbA1c 6.0  Eliquis (apixaban) daily prior to admission - last dose Sunday afternoon at 2p (also took Sat/Sun at 56 M)  - he did miss 1 dose on Saturday at 2p. Resumed Eliquis as prior to admission  Therapy recommendations: [facility] PT safety eval. Pt currently moving homes. Will not get assessment of current home at pt request. Pt to ask MD to arrange once he moves if he still wants.   Disposition: d/c home  Atrial Flutter  Home anticoagulation:  Eliquis (apixaban) daily prior to admission - last dose Sunday afternoon at 2p (also took Sat/Sun at 68 M)  - he did miss 1 dose on Saturday at 2p  Now on No anticoagulant. Pt not interested in changing drug. He will make sure he gets his meds  Has been on amiodarone but stopped recently by PCP d/t low blood pressure abd hypothyroidism  Card added carvedilol  Considered a flutter ablation in past.    Rate controlled. Hold amio. follow-up with EP recommended by cardiology.  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 Last seen 11/2017.   Cardiology consulted  pt hesitant to restart carvedilol, not on PTA d/t intolerance. Same for entresto. Dr. Haroldine Laws to discuss w/ [facility]. Posey Pronto at [facility]. Hold for now.  Lung infiltrate  CXR RLL infiltrate, cardiac enlargement  Repeat CXR COPD. RLL infiltrate w/ small pleural effusion. Low gradient compensated CHF.  Hypertension  Goal Per neuroradiologist for 24 hours post IR   Hyperlipidemia  Home meds:  No statin  LDL 71, goal < 70  Add low dose statin added - lipitor 10  Continue  statin at discharge  Other Stroke Risk Factors  Former cigarette smoker  ETOH use, urine drug screen positive on admission 43  THC use.  Urine drug screen positive on admission. Encouraged cessation.  Family hx stroke (brother and mother)  Other Active Problems  Hypothyroidism.  On Synthroid. Continue.   DISCHARGE EXAM Blood pressure 96/67, pulse (!) 25, temperature 98.3 F (36.8 C), temperature source Oral, resp. rate 19, height 5\' 8"  (1.727 m), weight 74 kg (163 lb 2.3 oz), SpO2 97 %. Pleasant middle-aged African-American male currently not in  distress.  Afebrile. Head is nontraumatic. Neck is supple without bruit.  Cardiac exam no murmur or gallop. Lungs are clear to auscultation. Distal pulses are well felt. Neurological Exam ;  Awake  Alert oriented x 3. Normal speech and language.eye movements full without nystagmus.fundi were not visualized. Vision acuity and fields appear normal. Hearing is normal. Palatal movements are normal. Face asymmetric with mild left nasolabial fold weakness when he smiles. Tongue midline. Normal strength, tone, reflexes and coordination. Normal sensation. Gait deferred.  Discharge Diet   Heart healthy thin liquids  DISCHARGE PLAN  Disposition:  Return home with family  HHPT safety eval deferred d/t impending move  Eliquis (apixaban) daily for secondary stroke prevention.  Ongoing risk factor control by Primary Care Physician at time of discharge  Follow-up Osei-Bonsu, Iona Beard, MD in 2 weeks as needed  Follow-up Dr. Benedetto Coons, already scheduled for 06/26/18  Follow-up Dr. Willaim Rayas Nicole Kindred) [place]  (Interventional Neuroradiologist) 2 weeks  Follow-up in [facility] in 4 weeks, office to schedule an appointment.   35 minutes were spent preparing discharge.  Burnetta Sabin, MSN, APRN, ANVP-BC, AGPCNP-BC Advanced Practice Stroke Nurse [place] for Schedule & Pager  information 05/20/2018 11:48 AM   I have personally examined this patient, reviewed notes, independently viewed imaging studies, participated in medical decision making and plan of care.ROS completed by me personally and pertinent positives fully documented  I have made any additions or clarifications directly to the above note. Agree with note above.    Antony Contras, MD Medical Director [facility] Pager: [phone] 05/20/2018 5:22 PM

## 2018-05-20 NOTE — Progress Notes (Signed)
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 Estanislado Pandy. Patient awake and alert sitting in bed with no complaints at this time. No focal neurologic symptoms demonstrated besides mild left facial droop. Right groin incision c/d/i.  Allergies: Lyrica [pregabalin] and Nsaids  Medications: Prior to Admission medications   Medication Sig Start Date End Date Taking? Authorizing Provider  amiodarone (PACERONE) 200 MG tablet TAKE 1 TABLET (200 MG TOTAL) BY MOUTH 2 (TWO) TIMES DAILY. Patient taking differently: TAKE 1 TABLET (200 MG TOTAL) BY MOUTH 2 (TWO) TIMES DAILY - NOON AND MIDNIGHT 05/17/15  Yes Bensimhon, Shaune Pascal, MD  buPROPion (WELLBUTRIN SR) 150 MG 12 hr tablet Take 1 tablet (150 mg total) by mouth 2 (two) times daily. Patient taking differently: Take 150 mg by mouth See admin instructions. Take 1 tablet (150 mg) by mouth twice daily - noon and midnight 04/21/15  Yes Bensimhon, Shaune Pascal, MD  carvedilol (COREG) 6.25 MG tablet TAKE 1 TABLET BY MOUTH TWICE A DAY Patient taking differently: TAKE 1 TABLET BY MOUTH TWICE A DAY - NOON AND MIDNIGHT 06/20/15  Yes Bensimhon, Shaune Pascal, MD  colchicine 0.6 MG tablet Take 1 tablet (0.6 mg total) by mouth daily. 1-2 times daily Patient taking differently: Take 0.6 mg by mouth See admin instructions. Take 1 tablet (0.6 mg) by mouth twice daily - noon and midnight 12/01/17  Yes Gekas, Jesse Fall, PA-C  ELIQUIS 5 MG TABS tablet TAKE 1 TABLET (5 MG TOTAL) BY MOUTH 2 (TWO) TIMES DAILY. Patient taking differently: TAKE 1 TABLET (5 MG TOTAL) BY MOUTH 2 (TWO) TIMES DAILY 12p and 12a 11/16/16  Yes Bensimhon, Shaune Pascal, MD  levothyroxine (SYNTHROID, LEVOTHROID) 25 MCG tablet Take 25 mcg by mouth daily at 12 noon. 05/09/18  Yes [provider]  Multiple Vitamin (MULTIVITAMIN WITH  MINERALS) TABS tablet Take 1 tablet by mouth daily at 12 noon.   Yes [provider]  pantoprazole (PROTONIX) 40 MG tablet Take 40 mg by mouth daily at 12 noon. 04/10/18  Yes [provider]  potassium chloride (MICRO-K) 10 MEQ CR capsule TAKE 2 TABLETS (20 MEQ TOTAL) BY MOUTH DAILY. Patient taking differently: Take 20 mEq by mouth daily at 12 noon.  03/11/17  Yes Larey Dresser, MD  sacubitril-valsartan (ENTRESTO) 24-26 MG Take 1 tablet by mouth 2 (two) times daily. Patient taking differently: Take 1 tablet by mouth See admin instructions. Take 1 tablet by mouth twice daily - noon and midnight 04/21/15  Yes Tillery, Satira Mccallum, PA-C  amiodarone (PACERONE) 100 MG tablet Take 1 tablet (100 mg total) by mouth daily. Patient not taking: Reported on 05/18/2018 03/22/15   Larey Dresser, MD  furosemide (LASIX) 40 MG tablet Take 40 mg (1 tablet) in the morningTake 20 mg ( 1/2 tablet) in the evening Patient taking differently: Take 20-40 mg by mouth See admin instructions. Take 1 tablet (40 mg) by mouth daily at noon and 1/2 tablet (20 mg) at midnight 08/22/15   Larey Dresser, MD  nicotine (NICODERM CQ - DOSED IN MG/24 HR) 7 mg/24hr patch PLACE 1 PATCH (7 MG TOTAL) ONTO THE SKIN DAILY. Patient not taking: Reported on 10/17/2016 05/03/15   Larey Dresser, MD     Vital Signs: BP 101/77 (BP Location: Right Arm)   Pulse 64   Temp 98.3 F (36.8 C) (Oral)  Resp 20   Ht 5\' 8"  (1.727 m)   Wt 163 lb 2.3 oz (74 kg)   SpO2 94%   BMI 24.81 kg/m   Physical Exam  Constitutional: He appears well-developed and well-nourished. No distress.  Cardiovascular: Normal rate, regular rhythm and normal heart sounds.  No murmur heard. Pulmonary/Chest: Effort normal and breath sounds normal. No respiratory distress. He has no wheezes.  Neurological:  Alert, awake, and oriented x3. Speech and comprehension intact. PERRL bilaterally. EOMs intact bilaterally without nystagmus or subjective  diplopia. Visual fields not assessed. Mild left facial droop. Tongue midline. Motor power symmetric proportional to effort. No pronator drift. Fine motor and coordination intact and symmetric. Gait not assessed. Romberg not assessed. Heel to toe not assessed. Distal pulses 1+ bilaterally.  Skin: Skin is warm and dry.  Right groin incision soft without active bleeding or hematoma.  Psychiatric: He has a normal mood and affect. His behavior is normal. Judgment and thought content normal.  Nursing note and vitals reviewed.   Imaging: Ct Angio Head W Or Wo Contrast  Result Date: 05/18/2018 CLINICAL DATA:  Focal neuro deficit, LEFT hemiparesis, RIGHT gaze preference. Last seen normal 1600 hours earlier today EXAM: CT ANGIOGRAPHY HEAD AND NECK TECHNIQUE: Multidetector CT imaging of the head and neck was performed using the standard protocol during bolus administration of intravenous contrast. Multiplanar CT image reconstructions and MIPs were obtained to evaluate the vascular anatomy. Carotid stenosis measurements (when applicable) are obtained utilizing NASCET criteria, using the distal internal carotid diameter as the denominator. CONTRAST:  48mL ISOVUE-370 IOPAMIDOL (ISOVUE-370) INJECTION 76% COMPARISON:  Code stroke CT earlier in the day. FINDINGS: CTA NECK FINDINGS Aortic arch: Standard branching. Imaged portion shows no evidence of aneurysm or dissection. No significant stenosis of the major arch vessel origins. Right carotid system: No evidence of dissection, stenosis (50% or greater) or occlusion. Left carotid system: No evidence of dissection, stenosis (50% or greater) or occlusion. Minor atheromatous change. Vertebral arteries: BILATERAL patent, LEFT dominant. Calcific plaque at the origin of the LEFT vertebral could result in 50% stenosis. Skeleton: Spondylosis. Some missing teeth. No worrisome osseous lesion. Other neck: No neck masses.  Normal thyroid.  RIGHT gaze preference. Upper chest:  No lung apex lesion or pneumothorax. Review of the MIP images confirms the above findings CTA HEAD FINDINGS Anterior circulation: BILATERAL cavernous carotid calcifications, less than 50% stenosis. Acute distal RIGHT M1 MCA occlusion, extending into the bifurcation. Poor collateral flow into the visualized RIGHT M2 and M3 branches. Patent LEFT MCA territory. Both anterior cerebral artery segments are widely patent. Posterior circulation: Basilar artery widely patent. LEFT vertebral dominant. No PCA or cerebellar stenosis or occlusion. No saccular aneurysm. Venous sinuses: As permitted by contrast timing, patent. Anatomic variants: None of significance. Delayed phase: Not performed. Review of the MIP images confirms the above findings IMPRESSION: Acute distal RIGHT M1 MCA occlusion, likely embolus. Poor collateral flow into the visualized RIGHT M2 and M3 branches. Mild nonstenotic extracranial and intracranial atherosclerotic change. These results were called by telephone at the time of interpretation on 05/18/2018 at 5:30 pm to Dr. Amie Portland , who verbally acknowledged these results. Electronically Signed   By: Staci Righter M.D.   On: 05/18/2018 17:52   Ct Angio Neck W Or Wo Contrast  Result Date: 05/18/2018 CLINICAL DATA:  Focal neuro deficit, LEFT hemiparesis, RIGHT gaze preference. Last seen normal 1600 hours earlier today EXAM: CT ANGIOGRAPHY HEAD AND NECK TECHNIQUE: Multidetector CT imaging of the head and neck was  performed using the standard protocol during bolus administration of intravenous contrast. Multiplanar CT image reconstructions and MIPs were obtained to evaluate the vascular anatomy. Carotid stenosis measurements (when applicable) are obtained utilizing NASCET criteria, using the distal internal carotid diameter as the denominator. CONTRAST:  37mL ISOVUE-370 IOPAMIDOL (ISOVUE-370) INJECTION 76% COMPARISON:  Code stroke CT earlier in the day. FINDINGS: CTA NECK FINDINGS Aortic arch: Standard  branching. Imaged portion shows no evidence of aneurysm or dissection. No significant stenosis of the major arch vessel origins. Right carotid system: No evidence of dissection, stenosis (50% or greater) or occlusion. Left carotid system: No evidence of dissection, stenosis (50% or greater) or occlusion. Minor atheromatous change. Vertebral arteries: BILATERAL patent, LEFT dominant. Calcific plaque at the origin of the LEFT vertebral could result in 50% stenosis. Skeleton: Spondylosis. Some missing teeth. No worrisome osseous lesion. Other neck: No neck masses.  Normal thyroid.  RIGHT gaze preference. Upper chest: No lung apex lesion or pneumothorax. Review of the MIP images confirms the above findings CTA HEAD FINDINGS Anterior circulation: BILATERAL cavernous carotid calcifications, less than 50% stenosis. Acute distal RIGHT M1 MCA occlusion, extending into the bifurcation. Poor collateral flow into the visualized RIGHT M2 and M3 branches. Patent LEFT MCA territory. Both anterior cerebral artery segments are widely patent. Posterior circulation: Basilar artery widely patent. LEFT vertebral dominant. No PCA or cerebellar stenosis or occlusion. No saccular aneurysm. Venous sinuses: As permitted by contrast timing, patent. Anatomic variants: None of significance. Delayed phase: Not performed. Review of the MIP images confirms the above findings IMPRESSION: Acute distal RIGHT M1 MCA occlusion, likely embolus. Poor collateral flow into the visualized RIGHT M2 and M3 branches. Mild nonstenotic extracranial and intracranial atherosclerotic change. These results were called by telephone at the time of interpretation on 05/18/2018 at 5:30 pm to Dr. Amie Portland , who verbally acknowledged these results. Electronically Signed   By: Staci Righter M.D.   On: 05/18/2018 17:52   Mr Brain Wo Contrast  Result Date: 05/19/2018 CLINICAL DATA:  47 year old male found to have distal right M1 occlusion, status post catheter  directed revascularization with TICI 3 reperfusion. EXAM: MRI HEAD WITHOUT CONTRAST TECHNIQUE: Multiplanar, multiecho pulse sequences of the brain and surrounding structures were obtained without intravenous contrast. COMPARISON:  Prior CTA and CT from 05/18/2018 FINDINGS: Brain: Cerebral volume within normal limits for age. No significant cerebral white matter changes for age. Small remote right cortical infarct at the right parietal lobe (series 11, image 18). Mildly increased diffusion signal abnormality at the right insula and right frontal operculum without frank restriction (series 5, image 61). Single punctate focus of restricted diffusion more superiorly within the posterior right frontal region (series 5, image 70). No other evidence for acute or subacute ischemia. Gray-white matter differentiation otherwise maintained. No acute or chronic intracranial hemorrhage. No mass lesion, midline shift or mass effect. No hydrocephalus. No extra-axial fluid collection. Pituitary gland normal. Vascular: Major intracranial vascular flow voids are maintained. Skull and upper cervical spine: Craniocervical junction normal. Upper cervical spine normal. Bone marrow signal intensity normal. No scalp soft tissue abnormality. Sinuses/Orbits: Globes and orbital soft tissues within normal limits. Scattered mucosal thickening throughout the paranasal sinuses. Trace opacity left mastoid air cells, of doubtful significance. Inner ear structures normal. Other: None. IMPRESSION: 1. Mildly increased diffusion abnormality within the right insula and right frontal operculum without frank restriction, consistent with mild ischemic insult. Additional punctate cortical infarct more superiorly within the posterior right frontal region. No other evidence for acute ischemia. 2.  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 3. Otherwise negative brain MRI. Electronically Signed   By: Jeannine Boga M.D.   On: 05/19/2018  05:42   Dg Chest Port 1 View  Result Date: 05/20/2018 CLINICAL DATA:  Cough, CHF, acute CVA, right lower lobe infiltrate EXAM: PORTABLE CHEST 1 VIEW COMPARISON:  Portable chest x-ray of May 18, 2018 FINDINGS: The lungs are mildly hyperinflated. There is persistent increased density in the right lower hemithorax with small right pleural effusion. The cardiac silhouette is enlarged. The pulmonary vascularity is mildly prominent but stable. The observed bony thorax is unremarkable. IMPRESSION: COPD. Persistent infiltrate or atelectasis in the right lower lobe with small right pleural effusion. Low-grade compensated CHF. Electronically Signed   By: David  Martinique M.D.   On: 05/20/2018 08:05   Dg Chest Port 1 View  Result Date: 05/19/2018 CLINICAL DATA:  Ischemic stroke. EXAM: PORTABLE CHEST 1 VIEW COMPARISON:  10/05/2014 FINDINGS: Cardiac enlargement. No vascular congestion. Increased density in the right lower lung likely representing focal pneumonia. No blunting of costophrenic angles. No pneumothorax. Mediastinal contours appear intact. IMPRESSION: Cardiac enlargement. Focal infiltration in the right lower lung likely representing pneumonia. Electronically Signed   By: Lucienne Capers M.D.   On: 05/19/2018 00:37   Ct Head Code Stroke Wo Contrast`  Result Date: 05/18/2018 CLINICAL DATA:  Code stroke.  LEFT-sided weakness.  Aphasia. EXAM: CT HEAD WITHOUT CONTRAST TECHNIQUE: Contiguous axial images were obtained from the base of the skull through the vertex without intravenous contrast. COMPARISON:  None. FINDINGS: The patient was unable to remain motionless for the exam. Small or subtle lesions could be overlooked. Brain: Slight hypoattenuation of the RIGHT insular ribbon as compared to the LEFT, could represent early ischemia. No hemorrhage, mass lesion, hydrocephalus, or extra-axial fluid. Normal for age cerebral volume. No definite white matter disease. Vascular: There is Hounsfield artifact and mild  motion which obscure the slices through the circle of Willis. However, the RIGHT MCA is questionably more dense than the LEFT, as seen on coronal series 5 image 25 and sagittal series 6, image 25. Skull: Normal. Negative for fracture or focal lesion. Sinuses/Orbits: No acute finding. Other: None. ASPECTS [facility] Stroke Program Early CT Score) - Ganglionic level infarction (caudate, lentiform nuclei, internal capsule, insula, M1-M3 cortex): 6 - Supraganglionic infarction (M4-M6 cortex): 3 Total score (0-10 with 10 being normal): 9 IMPRESSION: 1. Somewhat reduced quality exam demonstrating equivocal hypoattenuation of the RIGHT insular ribbon is compared to the LEFT. RIGHT MCA may be questionably more dense than LEFT, on multiplanar reformations. 2. ASPECTS is 9. These results were communicated to Dr. Rory Percy at 5:21 pmon 7/21/2019by text page via the [facility] messaging system. Electronically Signed   By: Staci Righter M.D.   On: 05/18/2018 17:22    Labs:  CBC: Recent Labs    12/01/17 1311 05/18/18 1703 05/18/18 1715 05/19/18 0233  WBC 3.7*  --  5.5 4.7  HGB 15.5 18.4* 16.3 15.0  HCT 45.2 54.0* 50.4 46.4  PLT 185  --  315 266    COAGS: Recent Labs    05/18/18 1715  INR 1.31  APTT 27    BMP: Recent Labs    12/01/17 1311 05/18/18 1703 05/18/18 1715 05/19/18 0233  NA 131* 137 136 136  K 3.7 4.5 4.6 3.8  CL 96* 101 101 103  CO2 22  --  20* 25  GLUCOSE 92 101* 103* 97  BUN 10 10 7 6   CALCIUM 9.0  --  8.8* 8.6*  CREATININE 0.96 0.90 1.04 0.96  GFRNONAA >60  --  >60 >60  GFRAA >60  --  >60 >60    LIVER FUNCTION TESTS: Recent Labs    12/01/17 1311 05/18/18 1715  BILITOT 2.8* 2.5*  AST 50* 48*  ALT 30 28  ALKPHOS 195* 202*  PROT 7.9 7.0  ALBUMIN 3.6 3.2*    Assessment and Plan:  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 Estanislado Pandy. Patient stable without focal neurological deficits demonstrated besides mild left facial droop. Right groin  incision stable. Appreciate and agree with neurology management. Plan to follow-up with Dr. Estanislado Pandy in clinic 2 weeks after discharge.  Electronically Signed: Earley Abide, PA-C 05/20/2018, 8:51 AM   I spent a total of 15 Minutes at the the patient's bedside AND on the patient's hospital floor or unit, greater than 50% of which was counseling/coordinating care for right MCA M1 segment occlusion s/p revascularization.

## 2018-05-21 NOTE — Progress Notes (Signed)
Late entry for missed Modified Rankin Score. Based on review of medical record including PT and OT evaluation.    05/19/18 1340  Modified Rankin (Stroke Patients Only)  Pre-Morbid Rankin Score 1  Modified Rankin [place], [place]  [phone] 05/21/2018

## 2018-05-26 ENCOUNTER — Encounter (HOSPITAL_COMMUNITY): Payer: Self-pay | Admitting: Interventional Radiology

## 2018-05-26 ENCOUNTER — Other Ambulatory Visit (HOSPITAL_COMMUNITY): Payer: Self-pay | Admitting: Student in an Organized Health Care Education/Training Program

## 2018-05-26 DIAGNOSIS — I639 Cerebral infarction, unspecified: Secondary | ICD-10-CM

## 2018-06-20 DIAGNOSIS — I425 Other restrictive cardiomyopathy: Secondary | ICD-10-CM | POA: Diagnosis not present

## 2018-06-20 DIAGNOSIS — Z8673 Personal history of transient ischemic attack (TIA), and cerebral infarction without residual deficits: Secondary | ICD-10-CM | POA: Diagnosis not present

## 2018-06-20 DIAGNOSIS — J309 Allergic rhinitis, unspecified: Secondary | ICD-10-CM | POA: Diagnosis not present

## 2018-06-20 DIAGNOSIS — M109 Gout, unspecified: Secondary | ICD-10-CM | POA: Diagnosis not present

## 2018-06-20 DIAGNOSIS — I10 Essential (primary) hypertension: Secondary | ICD-10-CM | POA: Diagnosis not present

## 2018-06-20 DIAGNOSIS — Z125 Encounter for screening for malignant neoplasm of prostate: Secondary | ICD-10-CM | POA: Diagnosis not present

## 2018-06-20 DIAGNOSIS — K219 Gastro-esophageal reflux disease without esophagitis: Secondary | ICD-10-CM | POA: Diagnosis not present

## 2018-06-20 DIAGNOSIS — Z720 Tobacco use: Secondary | ICD-10-CM | POA: Diagnosis not present

## 2018-06-20 DIAGNOSIS — G629 Polyneuropathy, unspecified: Secondary | ICD-10-CM | POA: Diagnosis not present

## 2018-06-20 DIAGNOSIS — Z700 Counseling related to sexual attitude: Secondary | ICD-10-CM | POA: Diagnosis not present

## 2018-06-20 DIAGNOSIS — I429 Cardiomyopathy, unspecified: Secondary | ICD-10-CM | POA: Diagnosis not present

## 2018-06-20 DIAGNOSIS — I5020 Unspecified systolic (congestive) heart failure: Secondary | ICD-10-CM | POA: Diagnosis not present

## 2018-07-09 ENCOUNTER — Ambulatory Visit: Payer: Medicare Other | Admitting: Adult Health

## 2018-07-10 ENCOUNTER — Encounter: Payer: Self-pay | Admitting: Adult Health

## 2018-07-10 ENCOUNTER — Ambulatory Visit (INDEPENDENT_AMBULATORY_CARE_PROVIDER_SITE_OTHER): Payer: Medicare Other | Admitting: Adult Health

## 2018-07-10 VITALS — BP 91/66 | HR 75 | Ht 68.0 in | Wt 154.0 lb

## 2018-07-10 DIAGNOSIS — I63511 Cerebral infarction due to unspecified occlusion or stenosis of right middle cerebral artery: Secondary | ICD-10-CM

## 2018-07-10 DIAGNOSIS — E785 Hyperlipidemia, unspecified: Secondary | ICD-10-CM | POA: Diagnosis not present

## 2018-07-10 DIAGNOSIS — I4892 Unspecified atrial flutter: Secondary | ICD-10-CM

## 2018-07-10 DIAGNOSIS — I10 Essential (primary) hypertension: Secondary | ICD-10-CM

## 2018-07-10 NOTE — Progress Notes (Signed)
[facility] [address] [place]. [place] [address] [phone]       OFFICE FOLLOW UP NOTE  Charles Hawkins Date of Birth:  01-12-1971 Medical Record Number:  [id number]   Reason for Referral:  hospital stroke follow up  CHIEF COMPLAINT:  Chief Complaint  Patient presents with  . Follow-up    Follow up hospital follow up room in back hallway, Pt saw Dr Leonie Man in hospital pt alone    HPI: Charles Hawkins is being seen today for initial visit in the office for right frontal cortical infarct embolic secondary to known atrial flutter with right M1 occlusion status post endovascular therapy on 05/18/2018. History obtained from patient and chart review. Reviewed all radiology images and labs personally.  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 No tPA d/t Eliquis use.  CT had reviewed and showed hypoattenuation in the insular ribbon with right MCA more dense than left.  CTA head and neck showed distal right M1 MCA occlusion and mild arthrosclerosis.  Cerebral angiogram was performed which showed occluded right M1 MCA and underwent endovascular therapy with TICI 3 reperfusion.  MRI head reviewed and showed right insular, right frontal operculum, punctate posterior right frontal cortical infarct along with old high right parietal cortical infarcts.  2D echo showing EF of 55 to 60% with LA moderately to severely dilated, RA severely dilated and persistent pulmonary artery hypertension and right ventricular dysfunction and increased right atrial pressure.  LDL 71 and recommended starting Lipitor 10 mg daily.  Patient does have a history of atrial flutter and was on Eliquis  PTA.  Per notes, he did miss 1 dose and prior to his stroke therefore was recommended to continue Eliquis 5 mg twice daily.  Recommended for follow-up with Dr. Posey Pronto at [facility] cardiology as outpatient along with follow-up with Dr. Estanislado Pandy for follow-up of IR procedure patient was discharged home in satisfactory condition.  Patient is being seen today for hospital follow-up.  Overall he has been doing well without residual deficits.  He continues to take the Eliquis without side effects of bleeding or bruising.  Continues to take Lipitor without side effects myalgias.  It was recommended for patient to follow-up with Dr. Posey Pronto at [facility] cardiology but it was recommended by Dr. Posey Pronto to follow-up with cardiologist in [place] and patient states follow-up appointment is made.  Blood pressure today 91/66 but this is normal for patient.  Patient has returned back to all previous activities.  Denies new or worsening stroke/TIA symptoms.   ROS:   14 system review of systems performed and negative with exception of no complaints  PMH:  Past Medical History:  Diagnosis Date  . Arthritis   . CHF (congestive heart failure) ([place])   . HTN (hypertension)   . 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 Multiple areas of subendocardial enhancement on MRI. Fat pad and myocardial biopsy negative at [facility].   . Stroke [facility])     PSH:  Past Surgical History:  Procedure Laterality Date  . IR ANGIO VERTEBRAL SEL SUBCLAVIAN INNOMINATE UNI R MOD SED  05/18/2018  . IR CT HEAD LTD  05/18/2018  . IR PERCUTANEOUS ART THROMBECTOMY/INFUSION INTRACRANIAL INC DIAG ANGIO  05/18/2018  . RADIOLOGY WITH ANESTHESIA N/A 05/18/2018   Procedure: RADIOLOGY WITH ANESTHESIA;  Surgeon:  Luanne Bras, MD;  Location: [facility];  Service: Radiology;  Laterality: N/A;    Social History:  Social History   Socioeconomic History  . Marital status: Single    Spouse name: Not on file  . Number of children: Not on file  . Years of  education: Not on file  . Highest education level: Not on file  Occupational History  . Not on file  Social Needs  . Financial resource strain: Not on file  . Food insecurity:    Worry: Not on file    Inability: Not on file  . Transportation needs:    Medical: Not on file    Non-medical: Not on file  Tobacco Use  . Smoking status: Former Smoker    Packs/day: 0.25    Types: Cigarettes  . Smokeless tobacco: Never Used  Substance and Sexual Activity  . Alcohol use: Yes    Alcohol/week: 1.0 standard drinks    Types: 1 Cans of beer per week    Comment: occasionally  . Drug use: Yes    Types: Marijuana    Comment: marijuana occasionally  . Sexual activity: Not on file  Lifestyle  . Physical activity:    Days per week: Not on file    Minutes per session: Not on file  . Stress: Not on file  Relationships  . Social connections:    Talks on phone: Not on file    Gets together: Not on file    Attends religious service: Not on file    Active member of club or organization: Not on file    Attends meetings of clubs or organizations: Not on file    Relationship status: Not on file  . Intimate partner violence:    Fear of current or ex partner: Not on file    Emotionally abused: Not on file    Physically abused: Not on file    Forced sexual activity: Not on file  Other Topics Concern  . Not on file  Social History Narrative  . Not on file    Family History:  Family History  Problem Relation Age of Onset  . Heart failure Son 1       s/p heart transplant  . Stroke Brother 31       Deceased  . Stroke Mother   . Hypertension Father     Medications:   Current Outpatient Medications on File Prior to Visit  Medication Sig Dispense Refill  . apixaban (ELIQUIS) 5 MG TABS tablet     . atorvastatin (LIPITOR) 10 MG tablet Take 1 tablet (10 mg total) by mouth daily at 6 PM. 30 tablet 2  . buPROPion (WELLBUTRIN SR) 150 MG 12 hr tablet Take 1 tablet (150 mg total) by mouth 2 (two)  times daily. (Patient taking differently: Take 150 mg by mouth See admin instructions. Take 1 tablet (150 mg) by mouth twice daily - noon and midnight) 60 tablet 3  . colchicine 0.6 MG tablet Take 1 tablet (0.6 mg total) by mouth daily. 1-2 times daily (Patient taking differently: Take 0.6 mg by mouth See admin instructions. Take 1 tablet (0.6 mg) by mouth twice daily - noon and midnight) 14 tablet 0  . ELIQUIS 5 MG TABS tablet TAKE 1 TABLET (5 MG TOTAL) BY MOUTH 2 (TWO) TIMES DAILY. (Patient taking differently: TAKE 1 TABLET (5 MG TOTAL) BY MOUTH 2 (TWO) TIMES DAILY 12p and 12a) 60 tablet 5  . furosemide (LASIX) 40 MG tablet Take 40 mg (  1 tablet) in the morningTake 20 mg ( 1/2 tablet) in the evening (Patient taking differently: Take 20-40 mg by mouth See admin instructions. Take 1 tablet (40 mg) by mouth daily at noon and 1/2 tablet (20 mg) at midnight) 135 tablet 3  . levothyroxine (SYNTHROID, LEVOTHROID) 25 MCG tablet Take 25 mcg by mouth daily at 12 noon.  0  . Multiple Vitamin (MULTIVITAMIN WITH MINERALS) TABS tablet Take 1 tablet by mouth daily at 12 noon.    . pantoprazole (PROTONIX) 40 MG tablet Take 40 mg by mouth daily at 12 noon.  2  . potassium chloride (MICRO-K) 10 MEQ CR capsule TAKE 2 TABLETS (20 MEQ TOTAL) BY MOUTH DAILY. (Patient taking differently: Take 20 mEq by mouth daily at 12 noon. ) 60 capsule 3  . sildenafil (VIAGRA) 50 MG tablet TAKE 1 TABLET BY MOUTH EVERY DAY AS NEEDED FOR RECTILE DYSFUNCTIONS ($506.55)    . DULoxetine (CYMBALTA) 30 MG capsule Take 30 mg by mouth 2 (two) times daily.  0   No current facility-administered medications on file prior to visit.     Allergies:   Allergies  Allergen Reactions  . Lyrica [Pregabalin] Anaphylaxis  . Nsaids Other (See Comments)     Physical Exam  Vitals:   07/10/18 1424  BP: 91/66  Pulse: 75  Weight: 154 lb (69.9 kg)  Height: 5\' 8"  (1.727 m)   Body mass index is 23.42 kg/m. No exam data present  General: well  developed, well nourished, pleasant middle-aged African-American male, seated, in no evident distress Head: head normocephalic and atraumatic.   Neck: supple with no carotid or supraclavicular bruits Cardiovascular: regular rate and rhythm, no murmurs Musculoskeletal: no deformity Skin:  no rash/petichiae Vascular:  Normal pulses all extremities  Neurologic Exam Mental Status: Awake and fully alert. Oriented to place and time. Recent and remote memory intact. Attention span, concentration and fund of knowledge appropriate. Mood and affect appropriate.  Cranial Nerves: Fundoscopic exam reveals sharp disc margins. Pupils equal, briskly reactive to light. Extraocular movements full without nystagmus. Visual fields full to confrontation. Hearing intact. Facial sensation intact. Face, tongue, palate moves normally and symmetrically.  Motor: Normal bulk and tone. Normal strength in all tested extremity muscles. Sensory.: intact to touch , pinprick , position and vibratory sensation.  Coordination: Rapid alternating movements normal in all extremities. Finger-to-nose and heel-to-shin performed accurately bilaterally. Gait and Station: Arises from chair without difficulty. Stance is normal. Gait demonstrates normal stride length and balance . Able to heel, toe and tandem walk without difficulty.  Reflexes: 1+ and symmetric. Toes downgoing.    NIHSS  0 Modified Rankin  0 HAS-BLED 1 CHA2DS2-VASc 3   Diagnostic Data (Labs, Imaging, Testing)  CT HEAD WO CONTRAST CT ANGIO HEAD W OR WO CONTRAST CT ANGIO NECK W OR WO CONTRAST 05/18/2018 IMPRESSION: 1. Somewhat reduced quality exam demonstrating equivocal hypoattenuation of the RIGHT insular ribbon is compared to the LEFT. RIGHT MCA may be questionably more dense than LEFT, on multiplanar reformations. 2. ASPECTS is 9.  MR BRAIN WO CONTRAST 05/19/18 IMPRESSION: 1. Mildly increased diffusion abnormality within the right insula and right frontal  operculum without frank restriction, consistent with mild ischemic insult. Additional punctate cortical infarct more superiorly within the posterior right frontal region. No other evidence for acute ischemia. 2. 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 3. Otherwise negative brain MRI.  CEREBRAL ANGIOGRAM 05/18/2018 IMPRESSION: Status post endovascular complete revascularization of occluded right middle cerebral artery M1 segment  with 1 pass with the 5 mm x 33 mm Embotrap retrieval device achieving a TICI 3 revascularization.  ECHOCARDIOGRAM 05/19/2018 Study Conclusions - Left ventricle: The cavity size was normal. There was moderate   concentric hypertrophy. Systolic function was normal. The   estimated ejection fraction was in the range of 55% to 60%. Wall   motion was normal; there were no regional wall motion   abnormalities. - Mitral valve: Mild, late systolicprolapse, involving the anterior   leaflet and the posterior leaflet. There was mild to moderate   regurgitation directed centrally. - Left atrium: The atrium was moderately to severely dilated. - Right ventricle: Systolic function was moderately reduced. - Right atrium: The atrium was severely dilated. - Pulmonary arteries: Systolic pressure was moderately increased.   PA peak pressure: 68 mm Hg (S).   ASSESSMENT: Maricela Kawahara is a 47 y.o. year old male here with right insular, right frontal perculum, and punctate posterior right frontal cortical infarct on 05/18/2018 secondary to known AF with right M1 occlusion s/p endovascular therapy with TICI 3. Vascular risk factors include restrictive cardiomyopathy on Eliquis, HTN and HLD.     PLAN: -Continue Eliquis (apixaban) daily  and lipitor  for secondary stroke prevention -F/u with PCP regarding your HLD and HTN management -f/u with cardiologist as scheduled for chronic cardiac conditions and management -continue to monitor BP at home -advised to  continue to stay active and maintain a healthy diet -Maintain strict control of hypertension with blood pressure goal below 130/90, diabetes with hemoglobin A1c goal below 6.5% and cholesterol with LDL cholesterol (bad cholesterol) goal below 70 mg/dL. I also advised the patient to eat a healthy diet with plenty of whole grains, cereals, fruits and vegetables, exercise regularly and maintain ideal body weight.  Follow up in 3 months or call earlier if needed   Greater than 50% of time during this 25 minute visit was spent on counseling,explanation of diagnosis of cortical infarcts, reviewing risk factor management of HTN, HLD and atrial flutter, planning of further management, discussion with patient and family and coordination of care    Venancio Poisson, AGNP-BC  [facility] [address] [place], [place] [address]  Phone [phone] Fax [phone] Note: This document was prepared with digital dictation and possible smart phrase technology. Any transcriptional errors that result from this process are unintentional.

## 2018-07-10 NOTE — Patient Instructions (Addendum)
Continue Eliquis (apixaban) daily  and lipitor 10mg   for secondary stroke prevention  Continue to follow up with PCP regarding cholesterol and blood pressure management   continue to follow up with cardiologist regarding atrial flutter and eliquis management  Continue to monitor blood pressure at home  Maintain strict control of hypertension with blood pressure goal below 130/90, diabetes with hemoglobin A1c goal below 6.5% and cholesterol with LDL cholesterol (bad cholesterol) goal below 70 mg/dL. I also advised the patient to eat a healthy diet with plenty of whole grains, cereals, fruits and vegetables, exercise regularly and maintain ideal body weight.  Followup in the future with me in 3 months or call earlier if needed       Thank you for coming to see [place] at [facility]. I hope we have been able to provide you high quality care today.  You may receive a patient satisfaction survey over the next few weeks. We would appreciate your feedback and comments so that we may continue to improve ourselves and the health of our patients.

## 2018-07-13 NOTE — Progress Notes (Signed)
I agree with the above plan 

## 2018-07-16 DIAGNOSIS — Z8673 Personal history of transient ischemic attack (TIA), and cerebral infarction without residual deficits: Secondary | ICD-10-CM | POA: Diagnosis not present

## 2018-07-16 DIAGNOSIS — K219 Gastro-esophageal reflux disease without esophagitis: Secondary | ICD-10-CM | POA: Diagnosis not present

## 2018-07-16 DIAGNOSIS — M109 Gout, unspecified: Secondary | ICD-10-CM | POA: Diagnosis not present

## 2018-07-16 DIAGNOSIS — J309 Allergic rhinitis, unspecified: Secondary | ICD-10-CM | POA: Diagnosis not present

## 2018-07-16 DIAGNOSIS — Z23 Encounter for immunization: Secondary | ICD-10-CM | POA: Diagnosis not present

## 2018-07-16 DIAGNOSIS — I429 Cardiomyopathy, unspecified: Secondary | ICD-10-CM | POA: Diagnosis not present

## 2018-07-16 DIAGNOSIS — I5020 Unspecified systolic (congestive) heart failure: Secondary | ICD-10-CM | POA: Diagnosis not present

## 2018-07-16 DIAGNOSIS — G629 Polyneuropathy, unspecified: Secondary | ICD-10-CM | POA: Diagnosis not present

## 2018-07-16 DIAGNOSIS — Z720 Tobacco use: Secondary | ICD-10-CM | POA: Diagnosis not present

## 2018-07-16 DIAGNOSIS — I10 Essential (primary) hypertension: Secondary | ICD-10-CM | POA: Diagnosis not present

## 2018-07-16 DIAGNOSIS — I425 Other restrictive cardiomyopathy: Secondary | ICD-10-CM | POA: Diagnosis not present

## 2018-08-14 ENCOUNTER — Encounter (HOSPITAL_COMMUNITY): Payer: Self-pay | Admitting: *Deleted

## 2018-08-14 ENCOUNTER — Inpatient Hospital Stay (HOSPITAL_COMMUNITY)
Admission: EM | Admit: 2018-08-14 | Discharge: 2018-08-19 | DRG: 193 | Disposition: A | Discharge status: Home / Self Care | Payer: Medicare Other | Attending: Internal Medicine | Admitting: Internal Medicine

## 2018-08-14 ENCOUNTER — Emergency Department (HOSPITAL_COMMUNITY): Payer: Medicare Other

## 2018-08-14 ENCOUNTER — Other Ambulatory Visit: Payer: Self-pay

## 2018-08-14 DIAGNOSIS — I425 Other restrictive cardiomyopathy: Secondary | ICD-10-CM | POA: Diagnosis present

## 2018-08-14 DIAGNOSIS — I110 Hypertensive heart disease with heart failure: Secondary | ICD-10-CM | POA: Diagnosis present

## 2018-08-14 DIAGNOSIS — Z8673 Personal history of transient ischemic attack (TIA), and cerebral infarction without residual deficits: Secondary | ICD-10-CM

## 2018-08-14 DIAGNOSIS — Z79890 Hormone replacement therapy (postmenopausal): Secondary | ICD-10-CM

## 2018-08-14 DIAGNOSIS — D649 Anemia, unspecified: Secondary | ICD-10-CM | POA: Diagnosis present

## 2018-08-14 DIAGNOSIS — Z7901 Long term (current) use of anticoagulants: Secondary | ICD-10-CM

## 2018-08-14 DIAGNOSIS — Z79899 Other long term (current) drug therapy: Secondary | ICD-10-CM

## 2018-08-14 DIAGNOSIS — Z87891 Personal history of nicotine dependence: Secondary | ICD-10-CM

## 2018-08-14 DIAGNOSIS — Z720 Tobacco use: Secondary | ICD-10-CM | POA: Diagnosis not present

## 2018-08-14 DIAGNOSIS — I5033 Acute on chronic diastolic (congestive) heart failure: Secondary | ICD-10-CM

## 2018-08-14 DIAGNOSIS — Z131 Encounter for screening for diabetes mellitus: Secondary | ICD-10-CM | POA: Diagnosis not present

## 2018-08-14 DIAGNOSIS — I4892 Unspecified atrial flutter: Secondary | ICD-10-CM | POA: Diagnosis not present

## 2018-08-14 DIAGNOSIS — K219 Gastro-esophageal reflux disease without esophagitis: Secondary | ICD-10-CM | POA: Diagnosis not present

## 2018-08-14 DIAGNOSIS — Z823 Family history of stroke: Secondary | ICD-10-CM

## 2018-08-14 DIAGNOSIS — J90 Pleural effusion, not elsewhere classified: Secondary | ICD-10-CM | POA: Diagnosis not present

## 2018-08-14 DIAGNOSIS — Z6822 Body mass index (BMI) 22.0-22.9, adult: Secondary | ICD-10-CM

## 2018-08-14 DIAGNOSIS — E43 Unspecified severe protein-calorie malnutrition: Secondary | ICD-10-CM | POA: Diagnosis not present

## 2018-08-14 DIAGNOSIS — I959 Hypotension, unspecified: Secondary | ICD-10-CM | POA: Diagnosis not present

## 2018-08-14 DIAGNOSIS — E871 Hypo-osmolality and hyponatremia: Secondary | ICD-10-CM | POA: Diagnosis not present

## 2018-08-14 DIAGNOSIS — G629 Polyneuropathy, unspecified: Secondary | ICD-10-CM | POA: Diagnosis not present

## 2018-08-14 DIAGNOSIS — E039 Hypothyroidism, unspecified: Secondary | ICD-10-CM | POA: Diagnosis present

## 2018-08-14 DIAGNOSIS — J189 Pneumonia, unspecified organism: Secondary | ICD-10-CM | POA: Diagnosis not present

## 2018-08-14 DIAGNOSIS — R9431 Abnormal electrocardiogram [ECG] [EKG]: Secondary | ICD-10-CM | POA: Diagnosis not present

## 2018-08-14 DIAGNOSIS — M109 Gout, unspecified: Secondary | ICD-10-CM | POA: Diagnosis not present

## 2018-08-14 DIAGNOSIS — I4891 Unspecified atrial fibrillation: Secondary | ICD-10-CM | POA: Diagnosis present

## 2018-08-14 DIAGNOSIS — J309 Allergic rhinitis, unspecified: Secondary | ICD-10-CM | POA: Diagnosis not present

## 2018-08-14 DIAGNOSIS — I5020 Unspecified systolic (congestive) heart failure: Secondary | ICD-10-CM | POA: Diagnosis not present

## 2018-08-14 DIAGNOSIS — G8929 Other chronic pain: Secondary | ICD-10-CM | POA: Diagnosis present

## 2018-08-14 DIAGNOSIS — I429 Cardiomyopathy, unspecified: Secondary | ICD-10-CM | POA: Diagnosis not present

## 2018-08-14 DIAGNOSIS — R42 Dizziness and giddiness: Secondary | ICD-10-CM | POA: Diagnosis not present

## 2018-08-14 DIAGNOSIS — I10 Essential (primary) hypertension: Secondary | ICD-10-CM | POA: Diagnosis not present

## 2018-08-14 DIAGNOSIS — E860 Dehydration: Secondary | ICD-10-CM | POA: Diagnosis present

## 2018-08-14 DIAGNOSIS — I493 Ventricular premature depolarization: Secondary | ICD-10-CM | POA: Diagnosis present

## 2018-08-14 DIAGNOSIS — Y95 Nosocomial condition: Secondary | ICD-10-CM | POA: Diagnosis present

## 2018-08-14 DIAGNOSIS — G253 Myoclonus: Secondary | ICD-10-CM | POA: Diagnosis present

## 2018-08-14 LAB — BASIC METABOLIC PANEL
Anion gap: 11 (ref 5–15)
BUN: 7 mg/dL (ref 6–20)
CO2: 26 mmol/L (ref 22–32)
Calcium: 8.2 mg/dL — ABNORMAL LOW (ref 8.9–10.3)
Chloride: 89 mmol/L — ABNORMAL LOW (ref 98–111)
Creatinine, Ser: 0.8 mg/dL (ref 0.61–1.24)
GFR calc Af Amer: >60 mL/min (ref >60)
GFR calc non Af Amer: >60 mL/min (ref >60)
Glucose, Bld: 92 mg/dL (ref 70–99)
Potassium: 3.8 mmol/L (ref 3.5–5.1)
Sodium: 126 mmol/L — ABNORMAL LOW (ref 135–145)

## 2018-08-14 LAB — CBC
HCT: 38.7 % — ABNORMAL LOW (ref 39.0–52.0)
Hemoglobin: 12.9 g/dL — ABNORMAL LOW (ref 13.0–17.0)
MCH: 28.1 pg (ref 26.0–34.0)
MCHC: 33.3 g/dL (ref 30.0–36.0)
MCV: 84.3 fL (ref 80.0–100.0)
Platelets: 516 10*3/uL — ABNORMAL HIGH (ref 150–400)
RBC: 4.59 MIL/uL (ref 4.22–5.81)
RDW: 14.5 % (ref 11.5–15.5)
WBC: 10.1 10*3/uL (ref 4.0–10.5)
nRBC: 0 % (ref 0.0–0.2)

## 2018-08-14 LAB — I-STAT CG4 LACTIC ACID, ED: Lactic Acid, Venous: 1.66 mmol/L (ref 0.5–1.9)

## 2018-08-14 LAB — I-STAT TROPONIN, ED: Troponin i, poc: 0.02 ng/mL (ref 0.00–0.08)

## 2018-08-14 MED ORDER — SODIUM CHLORIDE 0.9 % IV SOLN
2.0000 g | INTRAVENOUS | Status: DC
  Administered 2018-08-14: 2 g via INTRAVENOUS
  Filled 2018-08-14: qty 20

## 2018-08-14 MED ORDER — SODIUM CHLORIDE 0.9 % IV BOLUS
1000.0000 mL | Freq: Once | INTRAVENOUS | Status: AC
  Administered 2018-08-14: 1000 mL via INTRAVENOUS

## 2018-08-14 MED ORDER — SODIUM CHLORIDE 0.9 % IV SOLN
500.0000 mg | INTRAVENOUS | Status: DC
  Administered 2018-08-14: 500 mg via INTRAVENOUS
  Filled 2018-08-14: qty 500

## 2018-08-14 MED ORDER — SODIUM CHLORIDE 0.9 % IV BOLUS
500.0000 mL | Freq: Once | INTRAVENOUS | Status: AC
  Administered 2018-08-14: 500 mL via INTRAVENOUS

## 2018-08-14 NOTE — ED Provider Notes (Signed)
[place] EMERGENCY DEPARTMENT Provider Note   CSN: [id number] Arrival date & time: 08/14/18  1642     History   Chief Complaint Chief Complaint  Patient presents with  . Dizziness    HPI Charles Hawkins is a 47 y.o. male with a past medical history of hypertension, prior CVA, CHF with EF of 55 to 60% on echo done on 04/2018, who presents to ED for multiple complaints.  He complains of decreased appetite, decreased energy, dizziness, mood swings, cough productive with mucus, bodyaches since being on Cymbalta.  Patient was placed on Cymbalta 6 weeks ago for ongoing toe pain secondary to arthritis.  States that the medication was helping for about 1 week.  Since then, he is had the above-mentioned symptoms.  He has been off of the Cymbalta for the past 5 days with improvement in his symptoms.  However, he was sent here by his PCP for lab work, EKG and chest x-ray. States he had a similar reaction when being on gabapentin in the past for his joint pains. He states that "I'd rather just deal with the pain, I don't wanna be on anything if it makes me feel like this." He reports compliance with his other home medications. He denies any chest pain, shortness of breath, vision changes, numbness in arms or legs, headache, injuries or falls.  HPI  Past Medical History:  Diagnosis Date  . Arthritis   . CHF (congestive heart failure) ([place])   . HTN (hypertension)   . 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 Multiple areas of subendocardial enhancement on MRI. Fat pad and myocardial biopsy negative at [facility].   . Stroke [facility])     Patient Active Problem List   Diagnosis Date Noted  . Hypotension 08/14/2018  . HTN (hypertension) 05/20/2018  . Hyperlipemia 05/20/2018  . Tobacco use 05/20/2018  . Family hx-stroke 05/20/2018  . CHF (congestive heart failure) ([place]) 05/20/2018  . Restrictive cardiomyopathy ([place])   . 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 Atrial flutter ([place]) 12/06/2014  . Chronic diastolic heart failure ([place]) 03/03/2014  . Palpitations 03/03/2014  . Acute gout 02/06/2014  . Infiltrative cardiomyopathy ([place]) 02/06/2014    Past Surgical History:  Procedure Laterality Date  . IR ANGIO VERTEBRAL SEL SUBCLAVIAN INNOMINATE UNI R MOD SED  05/18/2018  . IR CT HEAD LTD  05/18/2018  . IR PERCUTANEOUS ART THROMBECTOMY/INFUSION INTRACRANIAL INC DIAG ANGIO  05/18/2018  . RADIOLOGY WITH ANESTHESIA N/A 05/18/2018   Procedure: RADIOLOGY WITH ANESTHESIA;  Surgeon: Luanne Bras, MD;  Location: [facility];  Service: Radiology;  Laterality: N/A;        Home Medications    Prior to Admission medications   Medication Sig Start Date End Date Taking? Authorizing Provider  atorvastatin (LIPITOR) 10 MG tablet Take 1 tablet (10 mg total) by mouth daily at 6 PM. 05/20/18  Yes Biby, Massie Kluver, NP  DULoxetine (CYMBALTA) 30 MG capsule Take 30 mg by mouth 2 (two) times daily. 06/20/18  Yes [provider]  ELIQUIS 5 MG TABS tablet TAKE 1 TABLET (5 MG TOTAL) BY MOUTH 2 (TWO) TIMES DAILY. Patient taking differently: Take 5 mg by mouth 2 (two) times daily.  11/16/16  Yes Bensimhon, Shaune Pascal, MD  furosemide (LASIX) 40 MG tablet Take 40 mg (1 tablet) in the morningTake 20 mg ( 1/2 tablet) in the evening Patient taking differently: Take 40 mg by mouth daily.  08/22/15  Yes Loralie Champagne  S, MD  levothyroxine (SYNTHROID, LEVOTHROID) 25 MCG tablet Take 25 mcg by mouth daily before breakfast.  05/09/18  Yes [provider]  Multiple Vitamin (MULTIVITAMIN WITH MINERALS) TABS tablet Take 1 tablet by mouth daily.    Yes [provider]  potassium chloride (MICRO-K) 10 MEQ CR capsule TAKE 2 TABLETS (20 MEQ TOTAL) BY MOUTH DAILY. Patient taking differently: Take 20 mEq by mouth daily at 12 noon.  03/11/17  Yes Larey Dresser, MD  buPROPion [facility] SR) 150 MG 12 hr tablet Take 1 tablet (150 mg total) by mouth 2 (two)  times daily. Patient not taking: Reported on 08/14/2018 04/21/15   Bensimhon, Shaune Pascal, MD  colchicine 0.6 MG tablet Take 1 tablet (0.6 mg total) by mouth daily. 1-2 times daily Patient not taking: Reported on 08/14/2018 12/01/17   Recardo Evangelist, PA-C    Family History Family History  Problem Relation Age of Onset  . Heart failure Son 1       s/p heart transplant  . Stroke Brother 88       Deceased  . Stroke Mother   . Hypertension Father     Social History Social History   Tobacco Use  . Smoking status: Former Smoker    Packs/day: 0.25    Types: Cigarettes  . Smokeless tobacco: Never Used  Substance Use Topics  . Alcohol use: Yes    Alcohol/week: 1.0 standard drinks    Types: 1 Cans of beer per week    Comment: occasionally  . Drug use: Yes    Types: Marijuana    Comment: marijuana occasionally     Allergies   Lyrica [pregabalin] and Nsaids   Review of Systems Review of Systems  Constitutional: Positive for activity change, appetite change and fatigue. Negative for chills and fever.  HENT: Negative for ear pain, rhinorrhea, sneezing and sore throat.   Eyes: Negative for photophobia and visual disturbance.  Respiratory: Positive for cough. Negative for chest tightness, shortness of breath and wheezing.   Cardiovascular: Negative for chest pain and palpitations.  Gastrointestinal: Negative for abdominal pain, blood in stool, constipation, diarrhea, nausea and vomiting.  Genitourinary: Negative for dysuria, hematuria and urgency.  Musculoskeletal: Negative for myalgias.  Skin: Negative for rash.  Neurological: Positive for dizziness. Negative for weakness and light-headedness.     Physical Exam Updated Vital Signs BP 95/78 (BP Location: Left Arm)   Pulse 81   Temp (!) 97.4 F (36.3 C) (Oral)   Resp (!) 27   Ht 5\' 8"  (1.727 m)   Wt 66.6 kg   SpO2 98%   BMI 22.34 kg/m   Physical Exam  Constitutional: He is oriented to person, place, and time. He  appears well-developed and well-nourished. No distress.  HENT:  Head: Normocephalic and atraumatic.  Nose: Nose normal.  Eyes: Conjunctivae and EOM are normal. Right eye exhibits no discharge. Left eye exhibits no discharge. No scleral icterus.  Neck: Normal range of motion. Neck supple.  Cardiovascular: Normal rate, regular rhythm, normal heart sounds and intact distal pulses. Exam reveals no gallop and no friction rub.  No murmur heard. Pulmonary/Chest: Effort normal and breath sounds normal. No respiratory distress.  Abdominal: Soft. Bowel sounds are normal. He exhibits no distension. There is no tenderness. There is no guarding.  Musculoskeletal: Normal range of motion. He exhibits no edema.  Neurological: He is alert and oriented to person, place, and time. No cranial nerve deficit or sensory deficit. He exhibits normal muscle tone. Coordination  normal.  Pupils reactive. No facial asymmetry noted. Cranial nerves appear grossly intact. Sensation intact to light touch on face, BUE and BLE. Strength 5/5 in BUE and BLE.  Skin: Skin is warm and dry. No rash noted.  Psychiatric: He has a normal mood and affect.  Nursing note and vitals reviewed.    ED Treatments / Results  Labs (all labs ordered are listed, but only abnormal results are displayed) Labs Reviewed  BASIC METABOLIC PANEL - Abnormal; Notable for the following components:      Result Value   Sodium 126 (*)    Chloride 89 (*)    Calcium 8.2 (*)    All other components within normal limits  CBC - Abnormal; Notable for the following components:   Hemoglobin 12.9 (*)    HCT 38.7 (*)    Platelets 516 (*)    All other components within normal limits  CULTURE, BLOOD (ROUTINE X 2)  CULTURE, BLOOD (ROUTINE X 2)  MRSA PCR SCREENING  URINALYSIS, ROUTINE W REFLEX MICROSCOPIC  OSMOLALITY  OSMOLALITY, URINE  I-STAT TROPONIN, ED  I-STAT CG4 LACTIC ACID, ED  I-STAT CG4 LACTIC ACID, ED    EKG EKG  Interpretation  Date/Time:  Thursday August 14 2018 17:01:07 EDT Ventricular Rate:  104 PR Interval:    QRS Duration: 98 QT Interval:  380 QTC Calculation: 499 R Axis:   180 Text Interpretation:  Undetermined rhythm Incomplete right bundle branch block Right ventricular hypertrophy with repolarization abnormality Lateral infarct , age undetermined Abnormal ECG When comapred to prior, similar Aflutter with multiple PVC.  No STEMI Confirmed by Antony Blackbird [phone]) on 08/14/2018 6:37:54 PM   Radiology Dg Chest 2 View  Result Date: 08/14/2018 CLINICAL DATA:  Dizziness and sleepiness. EXAM: CHEST - 2 VIEW COMPARISON:  05/20/2018 FINDINGS: The cardiac silhouette is enlarged. Mediastinal contours appear intact. Bilateral small pleural effusions. Worsening of right middle and bilateral lower lobe airspace consolidation. Osseous structures are without acute abnormality. Soft tissues are grossly normal. IMPRESSION: Worsening airspace consolidation in the right middle lobe and bilateral lower lobes. Bilateral small pleural effusions. Enlarged cardiac silhouette. Electronically Signed   By: Fidela Salisbury M.D.   On: 08/14/2018 17:50    Procedures Procedures (including critical care time)  CRITICAL CARE Performed by: Delia Heady   Total critical care time: 45 minutes  Critical care time was exclusive of separately billable procedures and treating other patients.  Critical care was necessary to treat or prevent imminent or life-threatening deterioration.  Critical care was time spent personally by me on the following activities: development of treatment plan with patient and/or surrogate as well as nursing, discussions with consultants, evaluation of patient's response to treatment, examination of patient, obtaining history from patient or surrogate, ordering and performing treatments and interventions, ordering and review of laboratory studies, ordering and review of radiographic studies,  pulse oximetry and re-evaluation of patient's condition.   Medications Ordered in ED Medications  cefTRIAXone (ROCEPHIN) 2 g in sodium chloride 0.9 % 100 mL IVPB (0 g Intravenous Stopped 08/14/18 2136)  azithromycin (ZITHROMAX) 500 mg in sodium chloride 0.9 % 250 mL IVPB (0 mg Intravenous Stopped 08/14/18 2216)  sodium chloride 0.9 % bolus 1,000 mL (0 mLs Intravenous Stopped 08/14/18 2037)  sodium chloride 0.9 % bolus 500 mL (0 mLs Intravenous Stopped 08/14/18 2243)     Initial Impression / Assessment and Plan / ED Course  I have reviewed the triage vital signs and the nursing notes.  Pertinent labs & imaging results  that were available during my care of the patient were reviewed by me and considered in my medical decision making (see chart for details).     47 year old male with past medical history of hypertension, prior CVA, CHF with normal EF, presents to ED for multiple complaints.  He reports decreased appetite, decreased energy, dizziness, mood strings, cough productive with mucus and body aches.  He was started on Cymbalta for his chronic joint pain 6 weeks ago.  He has discontinued this for the past 5 days.  He attributes his symptoms to this.  He was sent by his PCP for lab work, EKG and chest x-ray.  He had a similar reaction with side effects while being on gabapentin for his joint pains.  Denies any chest pain, abdominal pain.  On exam patient appears overall well.  No lower extremity edema, erythema or calf tenderness bilaterally.  He is afebrile.  However, patient is hypotensive to 32R to 51O systolic.  EKG shows tracing similar to prior.  X-ray shows possible worsening airspace consolidation in the right middle lobe.  CBC, troponin, lactic acid unremarkable.  Sodium level of 126.  Patient remains persistently hypotensive despite fluids given.  I feel that he needs admission for his persistent hypotension, hyponatremia and possible pneumonia.  Hospitalist to admit.  Appreciate the  help of hospitalist for management of this patient.  Portions of this note were generated with [employer]. Dictation errors may occur despite best attempts at proofreading.   Final Clinical Impressions(s) / ED Diagnoses   Final diagnoses:  Hyponatremia  Community acquired pneumonia, unspecified laterality  Hypotension, unspecified hypotension type    ED Discharge Orders    None       Delia Heady, PA-C 08/14/18 2358    Tegeler, Gwenyth Allegra, MD 08/15/18 [phone]

## 2018-08-14 NOTE — ED Triage Notes (Signed)
The pt saw his primary care doctor  Earlier today   He was on cymbalta for 11/2 months  He has been sleeping more than usual  Sometimes 4 days at a time  Dizziness not sleeping  He stopped taking cymbalta Sunday

## 2018-08-15 ENCOUNTER — Encounter (HOSPITAL_COMMUNITY): Payer: Self-pay | Admitting: Internal Medicine

## 2018-08-15 ENCOUNTER — Other Ambulatory Visit: Payer: Self-pay

## 2018-08-15 DIAGNOSIS — E039 Hypothyroidism, unspecified: Secondary | ICD-10-CM | POA: Diagnosis present

## 2018-08-15 DIAGNOSIS — D649 Anemia, unspecified: Secondary | ICD-10-CM | POA: Diagnosis present

## 2018-08-15 DIAGNOSIS — I950 Idiopathic hypotension: Secondary | ICD-10-CM | POA: Diagnosis not present

## 2018-08-15 DIAGNOSIS — I959 Hypotension, unspecified: Secondary | ICD-10-CM

## 2018-08-15 DIAGNOSIS — Z79890 Hormone replacement therapy (postmenopausal): Secondary | ICD-10-CM | POA: Diagnosis not present

## 2018-08-15 DIAGNOSIS — E43 Unspecified severe protein-calorie malnutrition: Secondary | ICD-10-CM | POA: Diagnosis present

## 2018-08-15 DIAGNOSIS — Z7901 Long term (current) use of anticoagulants: Secondary | ICD-10-CM | POA: Diagnosis not present

## 2018-08-15 DIAGNOSIS — Y95 Nosocomial condition: Secondary | ICD-10-CM | POA: Diagnosis present

## 2018-08-15 DIAGNOSIS — M109 Gout, unspecified: Secondary | ICD-10-CM | POA: Diagnosis present

## 2018-08-15 DIAGNOSIS — Z8673 Personal history of transient ischemic attack (TIA), and cerebral infarction without residual deficits: Secondary | ICD-10-CM | POA: Diagnosis not present

## 2018-08-15 DIAGNOSIS — I425 Other restrictive cardiomyopathy: Secondary | ICD-10-CM

## 2018-08-15 DIAGNOSIS — I484 Atypical atrial flutter: Secondary | ICD-10-CM

## 2018-08-15 DIAGNOSIS — J189 Pneumonia, unspecified organism: Secondary | ICD-10-CM | POA: Diagnosis present

## 2018-08-15 DIAGNOSIS — Z823 Family history of stroke: Secondary | ICD-10-CM | POA: Diagnosis not present

## 2018-08-15 DIAGNOSIS — J181 Lobar pneumonia, unspecified organism: Secondary | ICD-10-CM | POA: Diagnosis not present

## 2018-08-15 DIAGNOSIS — F518 Other sleep disorders not due to a substance or known physiological condition: Secondary | ICD-10-CM | POA: Diagnosis not present

## 2018-08-15 DIAGNOSIS — Z79899 Other long term (current) drug therapy: Secondary | ICD-10-CM | POA: Diagnosis not present

## 2018-08-15 DIAGNOSIS — I493 Ventricular premature depolarization: Secondary | ICD-10-CM | POA: Diagnosis present

## 2018-08-15 DIAGNOSIS — E871 Hypo-osmolality and hyponatremia: Secondary | ICD-10-CM

## 2018-08-15 DIAGNOSIS — Z6822 Body mass index (BMI) 22.0-22.9, adult: Secondary | ICD-10-CM | POA: Diagnosis not present

## 2018-08-15 DIAGNOSIS — I5033 Acute on chronic diastolic (congestive) heart failure: Secondary | ICD-10-CM

## 2018-08-15 DIAGNOSIS — Z87891 Personal history of nicotine dependence: Secondary | ICD-10-CM | POA: Diagnosis not present

## 2018-08-15 DIAGNOSIS — I4891 Unspecified atrial fibrillation: Secondary | ICD-10-CM | POA: Diagnosis present

## 2018-08-15 DIAGNOSIS — R569 Unspecified convulsions: Secondary | ICD-10-CM | POA: Diagnosis not present

## 2018-08-15 DIAGNOSIS — G253 Myoclonus: Secondary | ICD-10-CM | POA: Diagnosis present

## 2018-08-15 DIAGNOSIS — I110 Hypertensive heart disease with heart failure: Secondary | ICD-10-CM | POA: Diagnosis present

## 2018-08-15 DIAGNOSIS — E860 Dehydration: Secondary | ICD-10-CM | POA: Diagnosis present

## 2018-08-15 DIAGNOSIS — I4892 Unspecified atrial flutter: Secondary | ICD-10-CM | POA: Diagnosis present

## 2018-08-15 DIAGNOSIS — G8929 Other chronic pain: Secondary | ICD-10-CM | POA: Diagnosis present

## 2018-08-15 LAB — CBC WITH DIFFERENTIAL/PLATELET
Abs Immature Granulocytes: 0.03 10*3/uL (ref 0.00–0.07)
Basophils Absolute: 0.1 10*3/uL (ref 0.0–0.1)
Basophils Relative: 1 %
Eosinophils Absolute: 0 10*3/uL (ref 0.0–0.5)
Eosinophils Relative: 0 %
HCT: 37.3 % — ABNORMAL LOW (ref 39.0–52.0)
Hemoglobin: 12.8 g/dL — ABNORMAL LOW (ref 13.0–17.0)
Immature Granulocytes: 1 %
Lymphocytes Relative: 22 %
Lymphs Abs: 1.4 10*3/uL (ref 0.7–4.0)
MCH: 28.9 pg (ref 26.0–34.0)
MCHC: 34.3 g/dL (ref 30.0–36.0)
MCV: 84.2 fL (ref 80.0–100.0)
Monocytes Absolute: 0.7 10*3/uL (ref 0.1–1.0)
Monocytes Relative: 12 %
Neutro Abs: 4 10*3/uL (ref 1.7–7.7)
Neutrophils Relative %: 64 %
Platelets: 447 10*3/uL — ABNORMAL HIGH (ref 150–400)
RBC: 4.43 MIL/uL (ref 4.22–5.81)
RDW: 14.6 % (ref 11.5–15.5)
WBC: 6.2 10*3/uL (ref 4.0–10.5)
nRBC: 0 % (ref 0.0–0.2)

## 2018-08-15 LAB — OSMOLALITY, URINE: Osmolality, Ur: 596 mOsm/kg (ref 300–900)

## 2018-08-15 LAB — BASIC METABOLIC PANEL
Anion gap: 14 (ref 5–15)
BUN: 6 mg/dL (ref 6–20)
CO2: 20 mmol/L — ABNORMAL LOW (ref 22–32)
Calcium: 8.1 mg/dL — ABNORMAL LOW (ref 8.9–10.3)
Chloride: 93 mmol/L — ABNORMAL LOW (ref 98–111)
Creatinine, Ser: 0.72 mg/dL (ref 0.61–1.24)
GFR calc Af Amer: >60 mL/min (ref >60)
GFR calc non Af Amer: >60 mL/min (ref >60)
Glucose, Bld: 56 mg/dL — ABNORMAL LOW (ref 70–99)
Potassium: 3.8 mmol/L (ref 3.5–5.1)
Sodium: 127 mmol/L — ABNORMAL LOW (ref 135–145)

## 2018-08-15 LAB — MRSA PCR SCREENING: MRSA by PCR: NEGATIVE

## 2018-08-15 LAB — CBC
HCT: 36.3 % — ABNORMAL LOW (ref 39.0–52.0)
HCT: 37.9 % — ABNORMAL LOW (ref 39.0–52.0)
Hemoglobin: 11.9 g/dL — ABNORMAL LOW (ref 13.0–17.0)
Hemoglobin: 12.5 g/dL — ABNORMAL LOW (ref 13.0–17.0)
MCH: 27.7 pg (ref 26.0–34.0)
MCH: 28 pg (ref 26.0–34.0)
MCHC: 32.8 g/dL (ref 30.0–36.0)
MCHC: 33 g/dL (ref 30.0–36.0)
MCV: 84.6 fL (ref 80.0–100.0)
MCV: 85 fL (ref 80.0–100.0)
Platelets: 489 10*3/uL — ABNORMAL HIGH (ref 150–400)
Platelets: 472 10*3/uL — ABNORMAL HIGH (ref 150–400)
RBC: 4.29 MIL/uL (ref 4.22–5.81)
RBC: 4.46 MIL/uL (ref 4.22–5.81)
RDW: 14.3 % (ref 11.5–15.5)
RDW: 14.6 % (ref 11.5–15.5)
WBC: 5.8 10*3/uL (ref 4.0–10.5)
WBC: 5.8 10*3/uL (ref 4.0–10.5)
nRBC: 0 % (ref 0.0–0.2)
nRBC: 0 % (ref 0.0–0.2)

## 2018-08-15 LAB — RETICULOCYTES
Immature Retic Fract: 13.8 % (ref 2.3–15.9)
RBC.: 4.29 MIL/uL (ref 4.22–5.81)
Retic Count, Absolute: 66.5 10*3/uL (ref 19.0–186.0)
Retic Ct Pct: 1.6 % (ref 0.4–3.1)

## 2018-08-15 LAB — URINALYSIS, ROUTINE W REFLEX MICROSCOPIC
Glucose, UA: NEGATIVE mg/dL
Hgb urine dipstick: NEGATIVE
Ketones, ur: 5 mg/dL — AB
Leukocytes, UA: NEGATIVE
Nitrite: NEGATIVE
Protein, ur: NEGATIVE mg/dL
Specific Gravity, Urine: 1.026 (ref 1.005–1.030)
pH: 5 (ref 5.0–8.0)

## 2018-08-15 LAB — IRON AND TIBC
Iron: 41 ug/dL — ABNORMAL LOW (ref 45–182)
Saturation Ratios: 25 % (ref 17.9–39.5)
TIBC: 161 ug/dL — ABNORMAL LOW (ref 250–450)
UIBC: 120 ug/dL

## 2018-08-15 LAB — GRAM STAIN: Gram Stain: NONE SEEN

## 2018-08-15 LAB — EXPECTORATED SPUTUM ASSESSMENT W GRAM STAIN, RFLX TO RESP C

## 2018-08-15 LAB — HEPATIC FUNCTION PANEL
ALT: 19 U/L (ref 0–44)
ALT: 19 U/L (ref 0–44)
AST: 44 U/L — ABNORMAL HIGH (ref 15–41)
AST: 43 U/L — ABNORMAL HIGH (ref 15–41)
Albumin: 2.3 g/dL — ABNORMAL LOW (ref 3.5–5.0)
Albumin: 2.3 g/dL — ABNORMAL LOW (ref 3.5–5.0)
Alkaline Phosphatase: 232 U/L — ABNORMAL HIGH (ref 38–126)
Alkaline Phosphatase: 232 U/L — ABNORMAL HIGH (ref 38–126)
Bilirubin, Direct: 2.7 mg/dL — ABNORMAL HIGH (ref 0.0–0.2)
Bilirubin, Direct: 2.8 mg/dL — ABNORMAL HIGH (ref 0.0–0.2)
Indirect Bilirubin: 2.6 mg/dL — ABNORMAL HIGH (ref 0.3–0.9)
Indirect Bilirubin: 2.2 mg/dL — ABNORMAL HIGH (ref 0.3–0.9)
Total Bilirubin: 5.3 mg/dL — ABNORMAL HIGH (ref 0.3–1.2)
Total Bilirubin: 5 mg/dL — ABNORMAL HIGH (ref 0.3–1.2)
Total Protein: 6.7 g/dL (ref 6.5–8.1)
Total Protein: 6.7 g/dL (ref 6.5–8.1)

## 2018-08-15 LAB — FOLATE: Folate: 9 ng/mL (ref >5.9)

## 2018-08-15 LAB — FERRITIN: Ferritin: 388 ng/mL — ABNORMAL HIGH (ref 24–336)

## 2018-08-15 LAB — STREP PNEUMONIAE URINARY ANTIGEN: Strep Pneumo Urinary Antigen: NEGATIVE

## 2018-08-15 LAB — LACTIC ACID, PLASMA: Lactic Acid, Venous: 1.7 mmol/L (ref 0.5–1.9)

## 2018-08-15 LAB — TSH: TSH: 6.959 u[IU]/mL — ABNORMAL HIGH (ref 0.350–4.500)

## 2018-08-15 LAB — GLUCOSE, CAPILLARY
Glucose-Capillary: 71 mg/dL (ref 70–99)
Glucose-Capillary: 73 mg/dL (ref 70–99)
Glucose-Capillary: 107 mg/dL — ABNORMAL HIGH (ref 70–99)

## 2018-08-15 LAB — TROPONIN I: Troponin I: 0.06 ng/mL (ref <0.03)

## 2018-08-15 LAB — PROCALCITONIN: Procalcitonin: 0.94 ng/mL

## 2018-08-15 LAB — VITAMIN B12: Vitamin B-12: 1609 pg/mL — ABNORMAL HIGH (ref 180–914)

## 2018-08-15 LAB — OSMOLALITY: Osmolality: 265 mOsm/kg — ABNORMAL LOW (ref 275–295)

## 2018-08-15 LAB — MAGNESIUM: Magnesium: 1.8 mg/dL (ref 1.7–2.4)

## 2018-08-15 MED ORDER — APIXABAN 5 MG PO TABS
5.0000 mg | ORAL_TABLET | Freq: Two times a day (BID) | ORAL | Status: DC
  Administered 2018-08-15 – 2018-08-19 (×9): 5 mg via ORAL
  Filled 2018-08-15 (×10): qty 1

## 2018-08-15 MED ORDER — ATORVASTATIN CALCIUM 10 MG PO TABS
10.0000 mg | ORAL_TABLET | Freq: Every day | ORAL | Status: DC
  Administered 2018-08-15 – 2018-08-18 (×4): 10 mg via ORAL
  Filled 2018-08-15 (×4): qty 1

## 2018-08-15 MED ORDER — POTASSIUM CHLORIDE CRYS ER 20 MEQ PO TBCR: 20.0000 meq | EXTENDED_RELEASE_TABLET | Freq: Every day | ORAL | Status: DC

## 2018-08-15 MED ORDER — SODIUM CHLORIDE 0.9 % IV SOLN
1.0000 g | Freq: Three times a day (TID) | INTRAVENOUS | Status: DC
  Administered 2018-08-15 – 2018-08-18 (×11): 1 g via INTRAVENOUS
  Filled 2018-08-15 (×11): qty 1

## 2018-08-15 MED ORDER — POTASSIUM CHLORIDE CRYS ER 20 MEQ PO TBCR
40.0000 meq | EXTENDED_RELEASE_TABLET | Freq: Every day | ORAL | Status: DC
  Administered 2018-08-15 – 2018-08-19 (×5): 40 meq via ORAL
  Filled 2018-08-15 (×5): qty 2

## 2018-08-15 MED ORDER — VANCOMYCIN HCL IN DEXTROSE 750-5 MG/150ML-% IV SOLN
750.0000 mg | Freq: Three times a day (TID) | INTRAVENOUS | Status: DC
  Administered 2018-08-15 – 2018-08-16 (×3): 750 mg via INTRAVENOUS
  Filled 2018-08-15 (×4): qty 150

## 2018-08-15 MED ORDER — ADULT MULTIVITAMIN W/MINERALS CH
1.0000 | ORAL_TABLET | Freq: Every day | ORAL | Status: DC
  Administered 2018-08-15 – 2018-08-19 (×5): 1 via ORAL
  Filled 2018-08-15 (×5): qty 1

## 2018-08-15 MED ORDER — ACETAMINOPHEN 325 MG PO TABS: 650.0000 mg | ORAL_TABLET | Freq: Four times a day (QID) | ORAL | Status: DC | PRN

## 2018-08-15 MED ORDER — ACETAMINOPHEN 650 MG RE SUPP: 650.0000 mg | Freq: Four times a day (QID) | RECTAL | Status: DC | PRN

## 2018-08-15 MED ORDER — FUROSEMIDE 10 MG/ML IJ SOLN
40.0000 mg | Freq: Two times a day (BID) | INTRAMUSCULAR | Status: DC
  Administered 2018-08-15 – 2018-08-17 (×6): 40 mg via INTRAVENOUS
  Filled 2018-08-15 (×6): qty 4

## 2018-08-15 MED ORDER — SODIUM CHLORIDE 0.9 % IV SOLN
INTRAVENOUS | Status: DC | PRN
  Administered 2018-08-15 – 2018-08-18 (×4): via INTRAVENOUS

## 2018-08-15 MED ORDER — VANCOMYCIN HCL 10 G IV SOLR
1250.0000 mg | Freq: Once | INTRAVENOUS | Status: AC
  Administered 2018-08-15: 1250 mg via INTRAVENOUS
  Filled 2018-08-15: qty 1250

## 2018-08-15 MED ORDER — LEVOTHYROXINE SODIUM 25 MCG PO TABS
25.0000 ug | ORAL_TABLET | Freq: Every day | ORAL | Status: DC
  Administered 2018-08-15 – 2018-08-19 (×5): 25 ug via ORAL
  Filled 2018-08-15 (×5): qty 1

## 2018-08-15 MED ORDER — FUROSEMIDE 40 MG PO TABS: 40.0000 mg | ORAL_TABLET | Freq: Every day | ORAL | Status: DC

## 2018-08-15 MED ORDER — ENSURE ENLIVE PO LIQD
237.0000 mL | Freq: Three times a day (TID) | ORAL | Status: DC
  Administered 2018-08-15 – 2018-08-18 (×9): 237 mL via ORAL

## 2018-08-15 NOTE — Progress Notes (Addendum)
PROGRESS NOTE   Charles Hawkins  IYM:[id number]    DOB: 12-Jan-1971    DOA: 08/14/2018  PCP: Benito Mccreedy, MD   I have briefly reviewed patients previous medical records in [facility].  Brief Narrative:  47 year old married male, lives with spouse and 3 kids, ambulates at times with the help of a cane when he has lower extremity joint pains, does not work, PMH of HTN, restrictive CMP (followed by Dr. Aundra Dubin and at [facility] for possible transplant), A. fib on Eliquis, bilateral clubfeet status post surgical repair, gout, arthritis, recent CVA and chronic pain, was recently started on Cymbalta approximately 4 weeks ago by his PCP for lower extremity pains, presented to [facility] ED on 08/14/2018 due to approximately 1 month history of intermittent dizziness, weakness, long-standing history of cough which is now productive of yellow/green sputum, low-grade fevers, chills, decreased appetite, mood swings, chronic orthopnea and a brief episode of involuntary jerking and eyes rolling while asleep 3 days PTA.  In ED, noted to be hypotensive in the 70s-80s, improved with IV fluids and admitted for suspected pneumonia.  Cardiology consulted.  Assessment & Plan:   Principal Problem:   Hypotension Active Problems:   Acute on chronic diastolic CHF (congestive heart failure) (HCC)   Restrictive cardiomyopathy (HCC)   Hyponatremia   HCAP (healthcare-associated pneumonia)   Community acquired pneumonia   Protein-calorie malnutrition, severe   Hypotension Suspect due to dehydration from poor oral intake.  Clinically not septic.  Lactate normal.  Improved after IV fluids but BP still soft.  IV fluids held.  Suspected pneumonia/HCAP:  Blood cultures x2 negative.  MRSA PCR negative.  Sputum culture pending.  Empirically started on IV cefepime and vancomycin.  Consider stopping vancomycin in a.m. if cultures remain negative.  PCT 0.94.  Hyponatremia: DD: Dehydration, Cymbalta.  He stopped taking Cymbalta 4  to 5 days PTA.  Hydrated with normal saline on admission, now off.  Stable.  Asymptomatic.  Follow BMP in a.m.  Normocytic anemia Stable.?  Chronic disease.  No overt bleeding reported.  Acute on chronic diastolic CHF:  Care directed by cardiology, input appreciated.  TTE 7/19: LVEF 55-60%.  Clinically not volume overloaded.  Cardiology however has initiated Lasix 40 mg IV twice daily.  Atypical atrial flutter versus coarse A. fib, chronic Management per cardiology.  Rate controlled without medications.  Continue Eliquis.  Patient had CVA 7/19 in setting of missing at least a dose of Eliquis.  Elevated troponin Minimal, suspecting demand ischemia.  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  Marijuana use Reports that he was trying this to improve appetite.  Cessation counseled.  ?  Seizure-like activity Reportedly occurred 4 to 5 days PTA while he was asleep lasting ~ 8 secs and noted by his spouse.  Reported similar occurrence when on gabapentin.?  Mother with history of seizures.  Seizure precautions and monitor for seizures.?  Related to Cymbalta-now stopped by patient.  Will discuss with neurology regarding need for further evaluation and management.  CVA 04/2018 No residual deficits.  Continue Eliquis and statins.  Hyperbilirubinemia Unclear etiology.  Obtain split bilirubin.  Follow LFTs closely.  No GI symptoms reported.  Severe malnutrition in context of acute illness/injury Management per RD input on 10/18.  Hypothyroid Clinically euthyroid.  Continue Synthroid.  Low blood glucose:  56 on BMP on 10/18.  Monitor CBGs and treat appropriately.  DVT prophylaxis: Eliquis Code Status: Full Family Communication: None at bedside Disposition: DC home pending clinical improvement.   Consultants:  Cardiology  Procedures:   None  Antimicrobials:  IV cefepime and vancomycin   Subjective: Is better.  Feels stronger.  Having productive cough.  Denies chest pain.  Reports shortness of breath when lying supine.  Low-grade fevers at home.  No sick contacts.  ROS: As above, otherwise negative.  Objective:  Vitals:   08/14/18 2340 08/15/18 0419 08/15/18 0744 08/15/18 1301  BP: 95/78 96/62 101/66 95/72  Pulse: 81 66 89 90  Resp:  16    Temp: (!) 97.4 F (36.3 C) 98.1 F (36.7 C) 98.2 F (36.8 C) (!) 97.3 F (36.3 C)  TempSrc: Oral  Oral Oral  SpO2: 99% 100% 98% 98%  Weight: 66.6 kg 65.2 kg    Height: 5\' 8"  (1.727 m)       Examination:  General exam: Pleasant young male, moderately built and thinly nourished lying comfortably propped up in bed without distress.  Does not look septic or toxic.  Oral mucosa moist. Respiratory system: Diminished breath sounds in the bases, right more than left with few basal crackles.  Rest of lung fields clear to auscultation.  No increased work of breathing. Respiratory effort normal. Cardiovascular system: S1 & S2 heard, RRR. No JVD, murmurs, rubs, gallops or clicks. No pedal edema.  Telemetry personally reviewed: A. fib with controlled ventricular rate. Gastrointestinal system: Abdomen is nondistended, soft and nontender. No organomegaly or masses felt. Normal bowel sounds heard. Central nervous system: Alert and oriented. No focal neurological deficits. Extremities: Symmetric 5 x 5 power. Skin: No rashes, lesions or ulcers Psychiatry: Judgement and insight appear normal. Mood & affect appropriate.     Data Reviewed: I have personally reviewed following labs and imaging studies  CBC: Recent Labs  Lab 08/14/18 1717 08/15/18 0434 08/15/18 0652 08/15/18 0934  WBC 10.1 6.2 5.8 5.8  NEUTROABS  --  4.0  --   --   HGB 12.9* 12.8* 11.9* 12.5*  HCT 38.7* 37.3* 36.3* 37.9*  MCV 84.3 84.2 84.6 85.0  PLT 516* 447* 489* 242*   Basic Metabolic Panel: Recent Labs   Lab 08/14/18 1717 08/15/18 0434  NA 126* 127*  K 3.8 3.8  CL 89* 93*  CO2 26 20*  GLUCOSE 92 56*  BUN 7 6  CREATININE 0.80 0.72  CALCIUM 8.2* 8.1*  MG  --  1.8   Liver Function Tests: Recent Labs  Lab 08/15/18 0434  AST 44*  ALT 19  ALKPHOS 232*  BILITOT 5.3*  PROT 6.7  ALBUMIN 2.3*   Cardiac Enzymes: Recent Labs  Lab 08/15/18 0434  TROPONINI 0.06*   CBG: Recent Labs  Lab 08/15/18 1204  GLUCAP 71    Recent Results (from the past 240 hour(s))  Blood Culture (routine x 2)     Status: None (Preliminary result)   Collection Time: 08/14/18  8:38 PM  Result Value Ref Range Status   Specimen Description BLOOD LEFT ANTECUBITAL  Final   Special Requests   Final    BOTTLES DRAWN AEROBIC AND ANAEROBIC Blood Culture adequate volume   Culture   Final    NO GROWTH < 12 HOURS Performed at [facility], [address] [place], [place] [address]    Report Status PENDING  Incomplete  Blood Culture (routine x 2)     Status: None (Preliminary result)   Collection Time: 08/14/18  8:40 PM  Result Value  Ref Range Status   Specimen Description BLOOD RIGHT ANTECUBITAL  Final   Special Requests   Final    BOTTLES DRAWN AEROBIC AND ANAEROBIC Blood Culture adequate volume   Culture   Final    NO GROWTH < 12 HOURS Performed at [facility], [address], [place], [place] [address]    Report Status PENDING  Incomplete  MRSA PCR Screening     Status: None   Collection Time: 08/14/18 11:36 PM  Result Value Ref Range Status   MRSA by PCR NEGATIVE NEGATIVE Final    Comment:        The GeneXpert MRSA Assay (FDA approved for NASAL specimens only), is one component of a comprehensive MRSA colonization surveillance program. It is not intended to diagnose MRSA infection nor to guide or monitor treatment for MRSA infections. Performed at [facility], [place] [address], [place], [place] [address]   Culture, sputum-assessment     Status: None   Collection Time:  08/15/18  7:00 AM  Result Value Ref Range Status   Specimen Description SPUTUM  Final   Special Requests NONE  Final   Sputum evaluation   Final    THIS SPECIMEN IS ACCEPTABLE FOR SPUTUM CULTURE Performed at [facility], [address] [place], [place] [address]    Report Status 08/15/2018 FINAL  Final  Culture, respiratory     Status: None (Preliminary result)   Collection Time: 08/15/18  7:00 AM  Result Value Ref Range Status   Specimen Description SPUTUM  Final   Special Requests NONE Reflexed from [id number]  Final   Gram Stain   Final    MODERATE WBC PRESENT,BOTH PMN AND MONONUCLEAR FEW SQUAMOUS EPITHELIAL CELLS PRESENT FEW GRAM POSITIVE RODS FEW GRAM NEGATIVE RODS FEW GRAM VARIABLE ROD FEW GRAM POSITIVE COCCI IN PAIRS Performed at [facility], [place] [address], [place],  [address]    Culture PENDING  Incomplete   Report Status PENDING  Incomplete         Radiology Studies: Dg Chest 2 View  Result Date: 08/14/2018 CLINICAL DATA:  Dizziness and sleepiness. EXAM: CHEST - 2 VIEW COMPARISON:  05/20/2018 FINDINGS: The cardiac silhouette is enlarged. Mediastinal contours appear intact. Bilateral small pleural effusions. Worsening of right middle and bilateral lower lobe airspace consolidation. Osseous structures are without acute abnormality. Soft tissues are grossly normal. IMPRESSION: Worsening airspace consolidation in the right middle lobe and bilateral lower lobes. Bilateral small pleural effusions. Enlarged cardiac silhouette. Electronically Signed   By: Fidela Salisbury M.D.   On: 08/14/2018 17:50        Scheduled Meds: . apixaban  5 mg Oral BID  . atorvastatin  10 mg Oral q1800  . feeding supplement (ENSURE ENLIVE)  237 mL Oral TID BM  . furosemide  40 mg Intravenous BID  . levothyroxine  25 mcg Oral QAC breakfast  . multivitamin with minerals  1 tablet Oral Daily  . potassium chloride  40 mEq Oral Daily   Continuous Infusions: . sodium  chloride 10 mL/hr at 08/15/18 0654  . ceFEPime (MAXIPIME) IV 1 g (08/15/18 0655)  . vancomycin       LOS: 0 days     Vernell Leep, MD, FACP, [facility]. [facility] Pager [phone]  If 7PM-7AM, please contact night-coverage [email or web address] Password [email or web address] 08/15/2018, 2:13 PM

## 2018-08-15 NOTE — Progress Notes (Signed)
Pharmacy Antibiotic Note  Charles Hawkins is a 47 y.o. male admitted on 08/14/2018 with pneumonia.  Pharmacy has been consulted for vancomycin dosing.  Plan: Vancomycin 1250 mg IV x 1 then 750 mg IV q8 hours Continue cefepime as ordered F/u renal function, cultures and clinical coure Check VT when appropriate  Height: 5\' 8"  (172.7 cm) Weight: 146 lb 14.4 oz (66.6 kg) IBW/kg (Calculated) : 68.4  Temp (24hrs), Avg:98.3 F (36.8 C), Min:97.4 F (36.3 C), Max:99 F (37.2 C)  Recent Labs  Lab 08/14/18 1717 08/14/18 2055  WBC 10.1  --   CREATININE 0.80  --   LATICACIDVEN  --  1.66    Estimated Creatinine Clearance: 109.8 mL/min (by C-G formula based on SCr of 0.8 mg/dL).    Allergies  Allergen Reactions  . Lyrica [Pregabalin] Anaphylaxis  . Nsaids Other (See Comments)    Antimicrobials this admission: cefepime 10/18 >>  vancomycin 10/18 >>   Thank you for allowing pharmacy to be a part of this patient's care.  Excell Seltzer Poteet 08/15/2018 4:27 AM

## 2018-08-15 NOTE — Consult Note (Addendum)
Advanced Heart Failure Team Consult Note   Primary Physician: Benito Mccreedy, MD PCP-Cardiologist:  No primary care provider on file.  Reason for Consultation: Hypotension in restrictive cardiomyopathy  HPI:    [facility] is seen today for evaluation of hypotension in restrictive cardiomyopathy at the request of Dr. Algis Liming.   Charles Hawkins is a 47 y.o. male with h/o HTN, restrictive CMP, Afib, h/o bilateral club feet s/p surgical repair, gout, arthritis, h/o CVA on Eliquis, and chronic pain.   Seen for Acute CVA in 05/19/18. He had only missed one dose of Eliquis as outpatient. Had mild left facial droop as only persistent deficit. Was supposed to follow up with Dr Posey Pronto at [facility] in the transplant center, but has moved to [facility]  so never did.   Pt presented to [facility] 08/14/18 with worsening SOB and productive cough. Found to be hypotensive in the 70-80s. CXR showed RLL and Bilateral lower lobes in concerning for PNA. Started on Vanc/Cefepime. Pertinent Cr 0.80, K 3.8, WBC 6.2, Hgb 12.8, Lactic acid 1.7, and PCT 0.94. Na 126. Initial troponin 0.06. Blood cultures sent.   Feeling better today. He states he was started Cymbalta for his chronic leg pain about 6 weeks ago. Did great for about 4 weeks, but then started feeling "bad". Had sleeplessness, malaise, fatigue, night sweats, and "jerking" in his sleep. He stopped Cymbalta.  He states 3-4 days leading up to hospitalization he was feeling worse, with cough, and fever up to 101 at home. Weight runs "150-160" at home, but Weight down to 143 here. He states he has also had poor appetite. He has not followed up at [facility]. Denies orthopnea. ? PND with his symptoms above. He states he was taking his medicines as directed, but without follow up, was never restarted on ARB/ARNi, spiro, or BB. Has not needed extra diuretics. Denies sick contacts.   Echo 04/2018 LVEF 55-60%, Mild/Mod central MR, Mod/Sev LAE, Mod RV systolic reduction,  Severe RAE, PA peak pressure 68 mm Hg.   Review of systems complete and found to be negative unless listed in HPI.    Home Medications Prior to Admission medications   Medication Sig Start Date End Date Taking? Authorizing Provider  atorvastatin (LIPITOR) 10 MG tablet Take 1 tablet (10 mg total) by mouth daily at 6 PM. 05/20/18  Yes Biby, Massie Kluver, NP  DULoxetine (CYMBALTA) 30 MG capsule Take 30 mg by mouth 2 (two) times daily. 06/20/18  Yes [provider]  ELIQUIS 5 MG TABS tablet TAKE 1 TABLET (5 MG TOTAL) BY MOUTH 2 (TWO) TIMES DAILY. Patient taking differently: Take 5 mg by mouth 2 (two) times daily.  11/16/16  Yes Bensimhon, Shaune Pascal, MD  furosemide (LASIX) 40 MG tablet Take 40 mg (1 tablet) in the morningTake 20 mg ( 1/2 tablet) in the evening Patient taking differently: Take 40 mg by mouth daily.  08/22/15  Yes Larey Dresser, MD  levothyroxine (SYNTHROID, LEVOTHROID) 25 MCG tablet Take 25 mcg by mouth daily before breakfast.  05/09/18  Yes [provider]  Multiple Vitamin (MULTIVITAMIN WITH MINERALS) TABS tablet Take 1 tablet by mouth daily.    Yes [provider]  potassium chloride (MICRO-K) 10 MEQ CR capsule TAKE 2 TABLETS (20 MEQ TOTAL) BY MOUTH DAILY. Patient taking differently: Take 20 mEq by mouth daily at 12 noon.  03/11/17  Yes Larey Dresser, MD  buPROPion [facility] SR) 150 MG 12 hr tablet Take 1 tablet (150 mg total) by  mouth 2 (two) times daily. Patient not taking: Reported on 08/14/2018 04/21/15   Bensimhon, Shaune Pascal, MD  colchicine 0.6 MG tablet Take 1 tablet (0.6 mg total) by mouth daily. 1-2 times daily Patient not taking: Reported on 08/14/2018 12/01/17   Recardo Evangelist, PA-C    Past Medical History: Past Medical History:  Diagnosis Date  . Arthritis   . CHF (congestive heart failure) ([place])   . HTN (hypertension)   . 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 Multiple areas of subendocardial enhancement on MRI. Fat pad and  myocardial biopsy negative at [facility].   . Stroke [facility])     Past Surgical History: Past Surgical History:  Procedure Laterality Date  . IR ANGIO VERTEBRAL SEL SUBCLAVIAN INNOMINATE UNI R MOD SED  05/18/2018  . IR CT HEAD LTD  05/18/2018  . IR PERCUTANEOUS ART THROMBECTOMY/INFUSION INTRACRANIAL INC DIAG ANGIO  05/18/2018  . RADIOLOGY WITH ANESTHESIA N/A 05/18/2018   Procedure: RADIOLOGY WITH ANESTHESIA;  Surgeon: Luanne Bras, MD;  Location: [place];  Service: Radiology;  Laterality: N/A;    Family History: Family History  Problem Relation Age of Onset  . Heart failure Son 1       s/p heart transplant  . Stroke Brother 7       Deceased  . Stroke Mother   . Hypertension Father     Social History: Social History   Socioeconomic History  . Marital status: Single    Spouse name: Not on file  . Number of children: Not on file  . Years of education: Not on file  . Highest education level: Not on file  Occupational History  . Not on file  Social Needs  . Financial resource strain: Not on file  . Food insecurity:    Worry: Not on file    Inability: Not on file  . Transportation needs:    Medical: Not on file    Non-medical: Not on file  Tobacco Use  . Smoking status: Former Smoker    Packs/day: 0.25    Types: Cigarettes  . Smokeless tobacco: Never Used  Substance and Sexual Activity  . Alcohol use: Yes    Alcohol/week: 1.0 standard drinks    Types: 1 Cans of beer per week    Comment: occasionally  . Drug use: Yes    Types: Marijuana    Comment: marijuana occasionally  . Sexual activity: Not on file  Lifestyle  . Physical activity:    Days per week: Not on file    Minutes per session: Not on file  . Stress: Not on file  Relationships  . Social connections:    Talks on phone: Not on file    Gets together: Not on file    Attends religious service: Not on file    Active member of club or organization: Not on file    Attends meetings of clubs or organizations: Not  on file    Relationship status: Not on file  Other Topics Concern  . Not on file  Social History Narrative  . Not on file    Allergies:  Allergies  Allergen Reactions  . Lyrica [Pregabalin] Anaphylaxis  . Nsaids Other (See Comments)    Objective:    Vital Signs:   Temp:  [97.4 F (36.3 C)-99 F (37.2 C)] 98.2 F (36.8 C) (10/18 0744) Pulse Rate:  [62-104] 89 (10/18 0744) Resp:  [12-27] 16 (10/18 0419) BP: (87-101)/(60-78) 101/66 (10/18 0744) SpO2:  [91 %-100 %] 98 % (  10/18 0744) Weight:  [65.2 kg-66.7 kg] 65.2 kg (10/18 0419) Last BM Date: 08/14/18  Weight change: Filed Weights   08/14/18 1702 08/14/18 2340 08/15/18 0419  Weight: 66.7 kg 66.6 kg 65.2 kg    Intake/Output:   Intake/Output Summary (Last 24 hours) at 08/15/2018 0849 Last data filed at 08/15/2018 0500 Gross per 24 hour  Intake 2343.83 ml  Output -  Net 2343.83 ml      Physical Exam    General:  Well appearing. No resp difficulty HEENT: normal Neck: supple. JVP 12 cm. Carotids 2+ bilat; no bruits. No lymphadenopathy or thyromegaly appreciated. Cor: PMI nondisplaced. Irregularly irregular. No rubs, gallops or murmurs. Lungs: Diminished basilar edema. Dull R base.  Abdomen: soft, nontender, nondistended. No hepatosplenomegaly. No bruits or masses. Good bowel sounds. Extremities: no cyanosis, clubbing, or rash. Trace ankle edema.  Neuro: alert & orientedx3, cranial nerves grossly intact. moves all 4 extremities w/o difficulty. Affect pleasant  Telemetry   Afib/AFL 70-80s currently, personally reviewed.   EKG    AFib vs Aflutter 104 bpm, IRBBB, QRS 98, personally reviewed.  Labs   Basic Metabolic Panel: Recent Labs  Lab 08/14/18 1717 08/15/18 0434  NA 126* 127*  K 3.8 3.8  CL 89* 93*  CO2 26 20*  GLUCOSE 92 56*  BUN 7 6  CREATININE 0.80 0.72  CALCIUM 8.2* 8.1*  MG  --  1.8    Liver Function Tests: Recent Labs  Lab 08/15/18 0434  AST 44*  ALT 19  ALKPHOS 232*  BILITOT  5.3*  PROT 6.7  ALBUMIN 2.3*   No results for input(s): LIPASE, AMYLASE in the last 168 hours. No results for input(s): AMMONIA in the last 168 hours.  CBC: Recent Labs  Lab 08/14/18 1717 08/15/18 0434 08/15/18 0652  WBC 10.1 6.2 5.8  NEUTROABS  --  4.0  --   HGB 12.9* 12.8* 11.9*  HCT 38.7* 37.3* 36.3*  MCV 84.3 84.2 84.6  PLT 516* 447* 489*    Cardiac Enzymes: Recent Labs  Lab 08/15/18 0434  TROPONINI 0.06*    BNP: BNP (last 3 results) No results for input(s): BNP in the last 8760 hours.  ProBNP (last 3 results) No results for input(s): PROBNP in the last 8760 hours.   CBG: No results for input(s): GLUCAP in the last 168 hours.  Coagulation Studies: No results for input(s): LABPROT, INR in the last 72 hours.   Imaging   Dg Chest 2 View  Result Date: 08/14/2018 CLINICAL DATA:  Dizziness and sleepiness. EXAM: CHEST - 2 VIEW COMPARISON:  05/20/2018 FINDINGS: The cardiac silhouette is enlarged. Mediastinal contours appear intact. Bilateral small pleural effusions. Worsening of right middle and bilateral lower lobe airspace consolidation. Osseous structures are without acute abnormality. Soft tissues are grossly normal. IMPRESSION: Worsening airspace consolidation in the right middle lobe and bilateral lower lobes. Bilateral small pleural effusions. Enlarged cardiac silhouette. Electronically Signed   By: Fidela Salisbury M.D.   On: 08/14/2018 17:50      Medications:     Current Medications: . apixaban  5 mg Oral BID  . atorvastatin  10 mg Oral q1800  . levothyroxine  25 mcg Oral QAC breakfast  . multivitamin with minerals  1 tablet Oral Daily     Infusions: . sodium chloride 10 mL/hr at 08/15/18 0654  . ceFEPime (MAXIPIME) IV 1 g (08/15/18 0655)  . vancomycin         Patient Profile   Charles Hawkins is a 47 y.o. male  with h/o HTN, restrictive CMP, Afib, h/o bilateral club feet s/p surgical repair, gout, arthritis, h/o CVA on Eliquis, and  chronic pain.   Presented 08/14/18 with worsening dyspnea and malaise. Found to be hypotensive with + PCT. Started on ABX for PNA.   Assessment/Plan   1. Hypotension  - ? Sepsis with CXR concerning for PNA and PCT 0.94. - BCx pending.  - Primary covering empirically with Vanc/Cefepime.  - Improving with BPs in upper 90-100s this am.   2. Restrictive Cardiomyopathy  - Followed by [facility] for possible transplant. He has had transplant evaluation and was not listed due to tobacco and THC use.  - Last seen 11/2017 by [facility] - Echo 05/19/18 EF 55-60%.  - Volume status elevated on exam in setting IVF. BP improving.  - Will give IV lasix 40 mg BID for now and follow.  - Has previously refused coreg. Was supposed to see Dr. Posey Pronto in f/u and discuss.  - Previously on entresto but stopped due to intolerance.  - With previous LV dysfunction, could consider ARB/spiro as tolerated.  - Ultimately, will need to be seen at [facility] for Transplant consideration.  - Son had heart transplant at [place] age 70 months old due to reported infiltrative CM (exact diagnosis not reported in records I have). Apparently after that, Mr. Gelb underwent cardiac workup and cMRI suggested infiltrative CM with normal EF. - cMRI 5/15 Normal LV size and systolic function, EF 63%. Mild LV  hypertrophy. Normal RV size and systolic function,  Moderate to severe biatrial enlargement, Evidence for diffuse infiltrative process by delayed enhancement imaging, possible cardiac amyloidosis.  3. CVA 04/2018 - He underwent emergent CTA 04/2018 found a right M1 MCA occlusion consistent with an embolic stroke.Underwent endovascular revascularization of the right MCA with x 1 pass with 58mm x 90mm embotrap retrieve device achieving a TICI 3reperfusion. - Continue Eliquis and statin.   4. A fibrillation  - Remains in Afib/Flutter this admission.  - Amiodarone previously stopped by his PCP due to low BP and hypothyroidism.  - Has previously been  in A flutter and discussed A flutter ablation but did not go to appt.  - Continue eliquis 5 mg twice a day.   5. Anemia - Hgb 11.9 from 15 several months ago.  - Suspect chronic component. No overt bleeding.  - Iron low, TIBC low, Ferritin high.  - No overt bleeding.   6. ? Seizure like activity - Initially reported "jerking", but family states he had seizure like activity with eyes rolling in the back of his head and myoclonic jerks.  - Reports similar in the past on Gabapentin. Per primary.   Medication concerns reviewed with patient and pharmacy team. Barriers identified: Compliance, Poor follow up.   Length of Stay: 0  Annamaria Helling  08/15/2018, 8:49 AM  Advanced Heart Failure Team Pager [phone] (M-F; 7a - 4p)  Please contact [facility] for night-coverage after hours (4p -7a ) and weekends on [email or web address]  Patient seen with PA, agree with the above note.  He was admitted with shortness of breath, cough and fever at home to 101.  He was noted to be hypotensive to systolic 56L in the ER, now in 89H-734K systolic.  CXR showed RML and bilateral LL infiltrates.  PCT was 0.94.  He is afebrile here.  WBCs not elevated. Lactate normal.  He is in atypcal atrial flutter versus coarse AF.   On exam, JVP 10 cm.  Irregular rhythm, no murmur.  Bronchial BS bilateral bases.  No edema.   1. ID: Suspect PNA with infiltrates on CXR and productive cough.  Afebrile here, says he had fever to 101 at home.  WBCs not high but PCT is mildly elevated at 0.94.  SBP in 90s-100s currently, lactate normal. Creatinine stable. Not in septic shock.  - Continue broad spectrum abx per primary service.  2. 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  He has been seen at [facility] with evaluation for eventual transplant but has been smoking marijuana so has not been listed.  Last echo in 7/19 showed EF 55-60% with moderate LVH, mild-moderate MR, PASP  68 mmHg, and severe biatrial enlargement.  On exam, he does appear volume overloaded.  - Lasix 40 mg IV bid today.  - Would likely benefit from spironolactone, will start if BP remains stable.  3. Atypical atrial flutter vs coarse AF: Chronic. He is on Eliquis.  Had CVA in 7/19 in setting of missing at least a dose of Eliquis.  - Continue Eliquis.  - Rate controlled without meds.  4. Elevated troponin: Minimal, suspect demand ischemia from volume overload.   Loralie Champagne 08/15/2018 10:55 AM

## 2018-08-15 NOTE — Progress Notes (Signed)
Initial Nutrition Assessment  DOCUMENTATION CODES:   Severe malnutrition in context of acute illness/injury  INTERVENTION:    Ensure Enlive po TID, each supplement provides 350 kcal and 20 grams of protein  NUTRITION DIAGNOSIS:   Severe Malnutrition related to acute illness(HCAP) as evidenced by moderate fat depletion, percent weight loss(12% weight loss within 3 months).  GOAL:   Patient will meet greater than or equal to 90% of their needs  MONITOR:   PO intake, Supplement acceptance, Labs, I & O's  REASON FOR ASSESSMENT:   Malnutrition Screening Tool    ASSESSMENT:   47 yo male with PMH of infiltrative cardiomyopathy, HTN, CHF, stroke who was admitted on 10/17 with hypotension, hyponatremia, HCAP.  Patient reports poor intake related to poor appetite for the past 2-3 months. He weighed ~155 lbs 1-2 months ago. From discussion with patient, suspect intake has been meeting < 75% of estimated needs for the past 1-2 months. He has drank Ensure/Boost supplements in the past, but has not been drinking them recently.   Labs reviewed. Sodium 127 (L) Medications reviewed and include Lasix, MVI, potassium chloride.  Weight is down by 12% over the past 2 months. Currently volume overloaded, so suspect weight loss is greater than this since current weight is likely elevated with volume overload.    NUTRITION - FOCUSED PHYSICAL EXAM:    Most Recent Value  Orbital Region  Moderate depletion  Upper Arm Region  Moderate depletion  Thoracic and Lumbar Region  Mild depletion  Buccal Region  Mild depletion  Temple Region  Mild depletion  Clavicle Bone Region  Mild depletion  Clavicle and Acromion Bone Region  Mild depletion  Scapular Bone Region  Mild depletion  Dorsal Hand  Mild depletion  Patellar Region  Mild depletion  Anterior Thigh Region  No depletion  Posterior Calf Region  Mild depletion       Diet Order:   Diet Order            Diet Heart Room service  appropriate? Yes; Fluid consistency: Thin; Fluid restriction: 1200 mL Fluid  Diet effective now              EDUCATION NEEDS:   No education needs have been identified at this time  Skin:  Skin Assessment: Reviewed RN Assessment  Last BM:  10/17  Height:   Ht Readings from Last 1 Encounters:  08/14/18 5\' 8"  (1.727 m)    Weight:   Wt Readings from Last 1 Encounters:  08/15/18 65.2 kg    Ideal Body Weight:  70 kg  BMI:  Body mass index is 21.86 kg/m.  Estimated Nutritional Needs:   Kcal:  2000-2200  Protein:  90-110 gm  Fluid:  2-2.2 L    Molli Barrows, RD, LDN, [place] Pager [phone] After Hours Pager [phone]

## 2018-08-15 NOTE — H&P (Signed)
History and Physical    Charles Hawkins YQM:[id number] DOB: February 05, 1971 DOA: 08/14/2018  PCP: Benito Mccreedy, MD  Patient coming from: Home.  Chief Complaint: Weakness.  HPI: Charles Hawkins is a 47 y.o. male with history of recent stroke on Eliquis, atrial fibrillation, restrictive cardiomyopathy being followed at [facility] for possible transplant and chronic pain who was recently placed on Cymbalta for pain over the last 6 weeks patient started experiencing weakness dizziness and sleeplessness.  Patient discontinued Cymbalta 5 days ago following which patient started feeling better but still feeling dizzy and weak.  Patient also has benign productive cough for last many months.  Patient also gets short of breath on lying down which has been ongoing for last 2 years.  Denies any fever chills chest pain nausea vomiting diarrhea or any focal deficits.  ED Course: In the ER patient is found to be hypotensive in the 88B and 16X systolic and was given 4-5/0 L fluid bolus following which blood pressure improved.  Chest x-ray showing for concerning features for possible pneumonia versus fluid overload.  Patient is I am persistent productive cough yellow-colored sputum.  Patient was started on empiric antibiotics.  Patient's hemoglobin has dropped by 3 g from previous.  Review of Systems: As per HPI, rest all negative.   Past Medical History:  Diagnosis Date  . Arthritis   . CHF (congestive heart failure) ([place])   . HTN (hypertension)   . 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 Multiple areas of subendocardial enhancement on MRI. Fat pad and myocardial biopsy negative at [facility].   . Stroke [facility])     Past Surgical History:  Procedure Laterality Date  . IR ANGIO VERTEBRAL SEL SUBCLAVIAN INNOMINATE UNI R MOD SED  05/18/2018  . IR CT HEAD LTD  05/18/2018  . IR PERCUTANEOUS ART THROMBECTOMY/INFUSION INTRACRANIAL INC DIAG ANGIO  05/18/2018  . RADIOLOGY WITH ANESTHESIA N/A 05/18/2018   Procedure:  RADIOLOGY WITH ANESTHESIA;  Surgeon: Luanne Bras, MD;  Location: [facility];  Service: Radiology;  Laterality: N/A;     reports that he has quit smoking. His smoking use included cigarettes. He smoked 0.25 packs per day. He has never used smokeless tobacco. He reports that he drinks about 1.0 standard drinks of alcohol per week. He reports that he has current or past drug history. Drug: Marijuana.  Allergies  Allergen Reactions  . Lyrica [Pregabalin] Anaphylaxis  . Nsaids Other (See Comments)    Family History  Problem Relation Age of Onset  . Heart failure Son 1       s/p heart transplant  . Stroke Brother 60       Deceased  . Stroke Mother   . Hypertension Father     Prior to Admission medications   Medication Sig Start Date End Date Taking? Authorizing Provider  atorvastatin (LIPITOR) 10 MG tablet Take 1 tablet (10 mg total) by mouth daily at 6 PM. 05/20/18  Yes Biby, Massie Kluver, NP  DULoxetine (CYMBALTA) 30 MG capsule Take 30 mg by mouth 2 (two) times daily. 06/20/18  Yes [provider]  ELIQUIS 5 MG TABS tablet TAKE 1 TABLET (5 MG TOTAL) BY MOUTH 2 (TWO) TIMES DAILY. Patient taking differently: Take 5 mg by mouth 2 (two) times daily.  11/16/16  Yes Bensimhon, Shaune Pascal, MD  furosemide (LASIX) 40 MG tablet Take 40 mg (1 tablet) in the morningTake 20 mg ( 1/2 tablet) in the evening Patient taking differently: Take 40 mg by mouth daily.  08/22/15  Yes Larey Dresser, MD  levothyroxine (SYNTHROID, LEVOTHROID) 25 MCG tablet Take 25 mcg by mouth daily before breakfast.  05/09/18  Yes [provider]  Multiple Vitamin (MULTIVITAMIN WITH MINERALS) TABS tablet Take 1 tablet by mouth daily.    Yes [provider]  potassium chloride (MICRO-K) 10 MEQ CR capsule TAKE 2 TABLETS (20 MEQ TOTAL) BY MOUTH DAILY. Patient taking differently: Take 20 mEq by mouth daily at 12 noon.  03/11/17  Yes Larey Dresser, MD  buPROPion [facility] SR) 150 MG 12 hr tablet Take 1  tablet (150 mg total) by mouth 2 (two) times daily. Patient not taking: Reported on 08/14/2018 04/21/15   Bensimhon, Shaune Pascal, MD  colchicine 0.6 MG tablet Take 1 tablet (0.6 mg total) by mouth daily. 1-2 times daily Patient not taking: Reported on 08/14/2018 12/01/17   Recardo Evangelist, PA-C    Physical Exam: Vitals:   08/14/18 2253 08/14/18 2314 08/14/18 2340 08/15/18 0419  BP:   95/78 96/62  Pulse: 80 88 81 66  Resp: (!) 21 (!) 27  16  Temp:   (!) 97.4 F (36.3 C) 98.1 F (36.7 C)  TempSrc:   Oral   SpO2: 91% 98% 99% 100%  Weight:   66.6 kg   Height:   5\' 8"  (1.727 m)       Constitutional: Moderately built and nourished. Vitals:   08/14/18 2253 08/14/18 2314 08/14/18 2340 08/15/18 0419  BP:   95/78 96/62  Pulse: 80 88 81 66  Resp: (!) 21 (!) 27  16  Temp:   (!) 97.4 F (36.3 C) 98.1 F (36.7 C)  TempSrc:   Oral   SpO2: 91% 98% 99% 100%  Weight:   66.6 kg   Height:   5\' 8"  (1.727 m)    Eyes: Anicteric no pallor. ENMT: No discharge from the ears eyes nose or mouth. Neck: No mass or.  No neck rigidity.  No JVD appreciated. Respiratory: No rhonchi or crepitations. Cardiovascular: S1-S2 heard no murmurs appreciated. Abdomen: Soft nontender bowel sounds present. Musculoskeletal: No edema.  No joint effusion. Skin: No rash.  Skin appears warm. Neurologic: Alert awake oriented to time place and person.  Moves all extremities. Psychiatric: Appears normal.  Normal affect.   Labs on Admission: I have personally reviewed following labs and imaging studies  CBC: Recent Labs  Lab 08/14/18 1717  WBC 10.1  HGB 12.9*  HCT 38.7*  MCV 84.3  PLT 935*   Basic Metabolic Panel: Recent Labs  Lab 08/14/18 1717  NA 126*  K 3.8  CL 89*  CO2 26  GLUCOSE 92  BUN 7  CREATININE 0.80  CALCIUM 8.2*   GFR: Estimated Creatinine Clearance: 109.8 mL/min (by C-G formula based on SCr of 0.8 mg/dL). Liver Function Tests: No results for input(s): AST, ALT, ALKPHOS, BILITOT,  PROT, ALBUMIN in the last 168 hours. No results for input(s): LIPASE, AMYLASE in the last 168 hours. No results for input(s): AMMONIA in the last 168 hours. Coagulation Profile: No results for input(s): INR, PROTIME in the last 168 hours. Cardiac Enzymes: No results for input(s): CKTOTAL, CKMB, CKMBINDEX, TROPONINI in the last 168 hours. BNP (last 3 results) No results for input(s): PROBNP in the last 8760 hours. HbA1C: No results for input(s): HGBA1C in the last 72 hours. CBG: No results for input(s): GLUCAP in the last 168 hours. Lipid Profile: No results for input(s): CHOL, HDL, LDLCALC, TRIG, CHOLHDL, LDLDIRECT in the last 72 hours. Thyroid Function Tests:  No results for input(s): TSH, T4TOTAL, FREET4, T3FREE, THYROIDAB in the last 72 hours. Anemia Panel: No results for input(s): VITAMINB12, FOLATE, FERRITIN, TIBC, IRON, RETICCTPCT in the last 72 hours. Urine analysis:    Component Value Date/Time   COLORURINE STRAW (A) 05/18/2018 1811   APPEARANCEUR CLEAR 05/18/2018 1811   LABSPEC 1.011 05/18/2018 1811   PHURINE 5.0 05/18/2018 1811   GLUCOSEU NEGATIVE 05/18/2018 1811   HGBUR SMALL (A) 05/18/2018 1811   BILIRUBINUR NEGATIVE 05/18/2018 1811   KETONESUR NEGATIVE 05/18/2018 1811   PROTEINUR NEGATIVE 05/18/2018 1811   NITRITE NEGATIVE 05/18/2018 1811   LEUKOCYTESUR NEGATIVE 05/18/2018 1811   Sepsis Labs: @LABRCNTIP (procalcitonin:4,lacticidven:4) ) Recent Results (from the past 240 hour(s))  MRSA PCR Screening     Status: None   Collection Time: 08/14/18 11:36 PM  Result Value Ref Range Status   MRSA by PCR NEGATIVE NEGATIVE Final    Comment:        The GeneXpert MRSA Assay (FDA approved for NASAL specimens only), is one component of a comprehensive MRSA colonization surveillance program. It is not intended to diagnose MRSA infection nor to guide or monitor treatment for MRSA infections. Performed at [facility], [place] [address], [place], [place] [address]        Radiological Exams on Admission: Dg Chest 2 View  Result Date: 08/14/2018 CLINICAL DATA:  Dizziness and sleepiness. EXAM: CHEST - 2 VIEW COMPARISON:  05/20/2018 FINDINGS: The cardiac silhouette is enlarged. Mediastinal contours appear intact. Bilateral small pleural effusions. Worsening of right middle and bilateral lower lobe airspace consolidation. Osseous structures are without acute abnormality. Soft tissues are grossly normal. IMPRESSION: Worsening airspace consolidation in the right middle lobe and bilateral lower lobes. Bilateral small pleural effusions. Enlarged cardiac silhouette. Electronically Signed   By: Fidela Salisbury M.D.   On: 08/14/2018 17:50    EKG: Independently reviewed.  A. fib rate controlled.  Assessment/Plan Principal Problem:   Hypotension Active Problems:   Restrictive cardiomyopathy (HCC)   Hyponatremia   HCAP (healthcare-associated pneumonia)    1. Hypotension -cause not clear patient does not look septic though chest x-ray shows possibility of pneumonia.  Not sure if it is a reaction to Cymbalta which has been discontinued 5 days ago.  Follow blood cultures cycle cardiac markers follow lactate the patient did receive 1-1/2 L fluid in the ER for now will hold further fluids hold Lasix. 2. Possible pneumonia versus fluid overload -patient is coughing out copious amount of sputum.  Which is discolored yellow.  Follow sputum cultures blood cultures for now empiric antibiotics. 3. Restrictive cardiomyopathy last EF measured was 55 to 60% -holding Lasix due to hypotension I have consulted cardiology. 4. Hyponatremia could be from dehydration.  Check urine studies received fluid in the ER.  Closely follow metabolic panel. 5. Worsening anemia -we will check anemia panel follow CBCs check stool for occult blood type and screen. 6. Atrial fibrillation on Eliquis. 7. Recent stroke on Eliquis and statins.   DVT prophylaxis: Eliquis. Code Status: Full  code. Family Communication: Discussed with patient. Disposition Plan: Home. Consults called: Cardiology. Admission status: Observation.   Rise Patience MD [facility] Pager [phone].  If 7PM-7AM, please contact night-coverage [email or web address] Password [email or web address]  08/15/2018, 4:20 AM

## 2018-08-16 ENCOUNTER — Inpatient Hospital Stay (HOSPITAL_COMMUNITY): Payer: Medicare Other

## 2018-08-16 DIAGNOSIS — I950 Idiopathic hypotension: Secondary | ICD-10-CM

## 2018-08-16 DIAGNOSIS — E43 Unspecified severe protein-calorie malnutrition: Secondary | ICD-10-CM

## 2018-08-16 DIAGNOSIS — R569 Unspecified convulsions: Secondary | ICD-10-CM

## 2018-08-16 DIAGNOSIS — F518 Other sleep disorders not due to a substance or known physiological condition: Secondary | ICD-10-CM

## 2018-08-16 DIAGNOSIS — J181 Lobar pneumonia, unspecified organism: Secondary | ICD-10-CM

## 2018-08-16 DIAGNOSIS — G253 Myoclonus: Secondary | ICD-10-CM

## 2018-08-16 LAB — CBC
HCT: 38.6 % — ABNORMAL LOW (ref 39.0–52.0)
Hemoglobin: 13.1 g/dL (ref 13.0–17.0)
MCH: 28.2 pg (ref 26.0–34.0)
MCHC: 33.9 g/dL (ref 30.0–36.0)
MCV: 83.2 fL (ref 80.0–100.0)
Platelets: 576 10*3/uL — ABNORMAL HIGH (ref 150–400)
RBC: 4.64 MIL/uL (ref 4.22–5.81)
RDW: 14.5 % (ref 11.5–15.5)
WBC: 4.3 10*3/uL (ref 4.0–10.5)
nRBC: 0 % (ref 0.0–0.2)

## 2018-08-16 LAB — BASIC METABOLIC PANEL
Anion gap: 14 (ref 5–15)
BUN: 7 mg/dL (ref 6–20)
CO2: 24 mmol/L (ref 22–32)
Calcium: 8.2 mg/dL — ABNORMAL LOW (ref 8.9–10.3)
Chloride: 92 mmol/L — ABNORMAL LOW (ref 98–111)
Creatinine, Ser: 0.73 mg/dL (ref 0.61–1.24)
GFR calc Af Amer: >60 mL/min (ref >60)
GFR calc non Af Amer: >60 mL/min (ref >60)
Glucose, Bld: 90 mg/dL (ref 70–99)
Potassium: 3.7 mmol/L (ref 3.5–5.1)
Sodium: 130 mmol/L — ABNORMAL LOW (ref 135–145)

## 2018-08-16 LAB — GLUCOSE, CAPILLARY
Glucose-Capillary: 86 mg/dL (ref 70–99)
Glucose-Capillary: 94 mg/dL (ref 70–99)
Glucose-Capillary: 88 mg/dL (ref 70–99)
Glucose-Capillary: 59 mg/dL — ABNORMAL LOW (ref 70–99)
Glucose-Capillary: 91 mg/dL (ref 70–99)

## 2018-08-16 LAB — HEPATIC FUNCTION PANEL
ALT: 19 U/L (ref 0–44)
AST: 53 U/L — ABNORMAL HIGH (ref 15–41)
Albumin: 2.2 g/dL — ABNORMAL LOW (ref 3.5–5.0)
Alkaline Phosphatase: 247 U/L — ABNORMAL HIGH (ref 38–126)
Bilirubin, Direct: 2.2 mg/dL — ABNORMAL HIGH (ref 0.0–0.2)
Indirect Bilirubin: 1.8 mg/dL — ABNORMAL HIGH (ref 0.3–0.9)
Total Bilirubin: 4 mg/dL — ABNORMAL HIGH (ref 0.3–1.2)
Total Protein: 6.8 g/dL (ref 6.5–8.1)

## 2018-08-16 LAB — LEGIONELLA PNEUMOPHILA SEROGP 1 UR AG: L. pneumophila Serogp 1 Ur Ag: NEGATIVE

## 2018-08-16 NOTE — Progress Notes (Signed)
PROGRESS NOTE   Charles Hawkins  GLO:[id number]    DOB: May 15, 1971    DOA: 08/14/2018  PCP: Benito Mccreedy, MD   I have briefly reviewed patients previous medical records in [facility].  Brief Narrative:  47 year old married male, lives with spouse and 3 kids, ambulates at times with the help of a cane when he has lower extremity joint pains, does not work, PMH of HTN, restrictive CMP (followed by Dr. Aundra Dubin and at [facility] for possible transplant), A. fib on Eliquis, bilateral clubfeet status post surgical repair, gout, arthritis, recent CVA and chronic pain, was recently started on Cymbalta approximately 4 weeks ago by his PCP for lower extremity pains, presented to [facility] ED on 08/14/2018 due to approximately 1 month history of intermittent dizziness, weakness, long-standing history of cough which is now productive of yellow/green sputum, low-grade fevers, chills, decreased appetite, mood swings, chronic orthopnea and a brief episode of involuntary jerking and eyes rolling while asleep 3 days PTA.  In ED, noted to be hypotensive in the 70s-80s, improved with IV fluids and admitted for suspected pneumonia.  Cardiology consulted.  For suspicion of seizure-like activity, Neurology consulted.  Assessment & Plan:   Principal Problem:   Hypotension Active Problems:   Acute on chronic diastolic CHF (congestive heart failure) (HCC)   Restrictive cardiomyopathy (HCC)   Hyponatremia   HCAP (healthcare-associated pneumonia)   Community acquired pneumonia   Protein-calorie malnutrition, severe   Hypotension Suspect due to dehydration from poor oral intake.  Clinically not septic.  Lactate normal.  Improved after IV fluids.  IV fluids held.  Lasix resumed by cardiology for heart failure.  Blood pressures better.  Suspected pneumonia/HCAP:  Blood cultures x2 negative to date.  MRSA PCR negative.  Sputum culture pending.  Empirically started on IV cefepime and vancomycin. PCT 0.94.  Discontinued  vancomycin.  Hyponatremia: DD: Dehydration, Cymbalta.  He stopped taking Cymbalta 4 to 5 days PTA.  Hydrated with normal saline on admission, now off.  Asymptomatic.  Improving.  Normocytic anemia Stable.?  Chronic disease.  No overt bleeding reported.  Acute on chronic diastolic CHF:  Care directed by cardiology, input appreciated.  TTE 7/19: LVEF 55-60%.  Clinically not volume overloaded except elevated JVD.  Cardiology initiated Lasix 40 mg IV twice daily.  -1.3 L yesterday.  Atypical atrial flutter versus coarse A. fib, chronic Management per cardiology.  Rate controlled without medications.  Continue Eliquis.  Patient had CVA 7/19 in setting of missing at least a dose of Eliquis.  Elevated troponin Minimal, suspecting demand ischemia.  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  Marijuana use Reports that he was trying this to improve appetite.  Cessation counseled.  ?  Seizure-like activity Reportedly occurred 4 to 5 days PTA while he was asleep lasting ~ 8 secs and noted by his spouse.  Reported similar occurrence when on gabapentin.?  Mother with history of seizures.  Seizure precautions and monitor for seizures.?  Related to Cymbalta-now stopped by patient. Neurology consulted and as per history provided to them, the spells/hypnic jerks occurred while awake and suspect Cymbalta side effect rather than seizures.  They recommend no Cymbalta, EEG which if positive for seizure activity then start AEDs.    CVA 04/2018 No residual deficits.  Continue Eliquis and statins.  Hyperbilirubinemia Unclear etiology.  Obtain split bilirubin/almost even.  Follow LFTs closely, improving.?  Passive  hepatic congestion from CHF.  No GI symptoms reported.  Severe malnutrition in context of acute illness/injury Management per RD input on  10/18.  Hypothyroid Clinically euthyroid.  Continue Synthroid.  Low blood glucose:  56 on BMP on 10/18.  No hypoglycemia on CBGs, DC CBG checks.   DVT prophylaxis: Eliquis Code Status: Full Family Communication: None at bedside Disposition: DC home pending clinical improvement.   Consultants:  Cardiology  Procedures:  None  Antimicrobials:  IV cefepime and vancomycin   Subjective: Overall feels better.  Reports that he was able to lie more flat and rest overnight.  Cough improving and sputum clearing up.  No chest pain.  No further involuntary movements reported.  ROS: As above, otherwise negative.  Objective:  Vitals:   08/16/18 0452 08/16/18 0453 08/16/18 0852 08/16/18 1225  BP:  106/76 105/78 95/70  Pulse:  81 96 85  Resp:  20    Temp:  97.7 F (36.5 C) 98.3 F (36.8 C) 97.8 F (36.6 C)  TempSrc:  Oral Oral Oral  SpO2:  (!) 89% 98% 95%  Weight: 65.8 kg     Height:        Examination:  General exam: Pleasant young male, moderately built and thinly nourished lying comfortably propped up in bed without distress.  Stable Respiratory system: Improved breath sounds.  Occasional basal crackles but otherwise clear to auscultation.  No increased work of breathing. Cardiovascular system: S1 & S2 heard, RRR. No JVD, murmurs, rubs, gallops or clicks. No pedal edema.  Telemetry personally reviewed: A. fib with controlled ventricular rate, occasional PVCs.?  Occasional nonsustained A. fib with aberrancy versus NSVT Gastrointestinal system: Abdomen is nondistended, soft and nontender. No organomegaly or masses felt. Normal bowel sounds heard.  Stable Central nervous system: Alert and oriented. No focal neurological deficits.  Stable Extremities: Symmetric 5 x 5 power. Skin: No rashes, lesions or ulcers Psychiatry: Judgement and insight appear normal. Mood & affect appropriate.     Data Reviewed: I have personally reviewed following labs and imaging  studies  CBC: Recent Labs  Lab 08/14/18 1717 08/15/18 0434 08/15/18 0652 08/15/18 0934 08/16/18 0600  WBC 10.1 6.2 5.8 5.8 4.3  NEUTROABS  --  4.0  --   --   --   HGB 12.9* 12.8* 11.9* 12.5* 13.1  HCT 38.7* 37.3* 36.3* 37.9* 38.6*  MCV 84.3 84.2 84.6 85.0 83.2  PLT 516* 447* 489* 472* 914*   Basic Metabolic Panel: Recent Labs  Lab 08/14/18 1717 08/15/18 0434 08/16/18 0600  NA 126* 127* 130*  K 3.8 3.8 3.7  CL 89* 93* 92*  CO2 26 20* 24  GLUCOSE 92 56* 90  BUN 7 6 7   CREATININE 0.80 0.72 0.73  CALCIUM 8.2* 8.1* 8.2*  MG  --  1.8  --    Liver Function Tests: Recent Labs  Lab 08/15/18 0434 08/15/18 1500 08/16/18 0600  AST 44* 43* 53*  ALT 19 19 19   ALKPHOS 232* 232* 247*  BILITOT 5.3* 5.0* 4.0*  PROT 6.7 6.7 6.8  ALBUMIN 2.3* 2.3* 2.2*   Cardiac Enzymes: Recent Labs  Lab 08/15/18 0434  TROPONINI 0.06*   CBG: Recent Labs  Lab 08/15/18 1204 08/15/18 1607 08/15/18 2239 08/16/18 0735 08/16/18 1137  GLUCAP 71 73 107* 86 94    Recent Results (from the past 240 hour(s))  Blood Culture (routine x 2)     Status: None (Preliminary result)   Collection Time: 08/14/18  8:38 PM  Result Value Ref Range  Status   Specimen Description BLOOD LEFT ANTECUBITAL  Final   Special Requests   Final    BOTTLES DRAWN AEROBIC AND ANAEROBIC Blood Culture adequate volume   Culture   Final    NO GROWTH 2 DAYS Performed at [facility], [address], [place], [place] [address]    Report Status PENDING  Incomplete  Blood Culture (routine x 2)     Status: None (Preliminary result)   Collection Time: 08/14/18  8:40 PM  Result Value Ref Range Status   Specimen Description BLOOD RIGHT ANTECUBITAL  Final   Special Requests   Final    BOTTLES DRAWN AEROBIC AND ANAEROBIC Blood Culture adequate volume   Culture   Final    NO GROWTH 2 DAYS Performed at [facility], [place] [address], [place], [place] [address]    Report Status PENDING  Incomplete  MRSA PCR  Screening     Status: None   Collection Time: 08/14/18 11:36 PM  Result Value Ref Range Status   MRSA by PCR NEGATIVE NEGATIVE Final    Comment:        The GeneXpert MRSA Assay (FDA approved for NASAL specimens only), is one component of a comprehensive MRSA colonization surveillance program. It is not intended to diagnose MRSA infection nor to guide or monitor treatment for MRSA infections. Performed at [facility], [place] [address] [place], [place] [address]   Culture, sputum-assessment     Status: None   Collection Time: 08/15/18  7:00 AM  Result Value Ref Range Status   Specimen Description SPUTUM  Final   Special Requests NONE  Final   Sputum evaluation   Final    THIS SPECIMEN IS ACCEPTABLE FOR SPUTUM CULTURE Performed at [facility], [address] [place], [place] [address]    Report Status 08/15/2018 FINAL  Final  Gram stain     Status: None   Collection Time: 08/15/18  7:00 AM  Result Value Ref Range Status   Specimen Description URINE, RANDOM  Final   Special Requests NONE  Final   Gram Stain   Final    NO WBC SEEN NO ORGANISMS SEEN CYTOSPIN SMEAR Performed at [facility], [place] [address], [place], [place] [address]    Report Status 08/15/2018 FINAL  Final  Culture, respiratory     Status: None (Preliminary result)   Collection Time: 08/15/18  7:00 AM  Result Value Ref Range Status   Specimen Description SPUTUM  Final   Special Requests NONE Reflexed from [id number]  Final   Gram Stain   Final    MODERATE WBC PRESENT,BOTH PMN AND MONONUCLEAR FEW SQUAMOUS EPITHELIAL CELLS PRESENT FEW GRAM POSITIVE RODS FEW GRAM NEGATIVE RODS FEW GRAM VARIABLE ROD FEW GRAM POSITIVE COCCI IN PAIRS    Culture   Final    CULTURE REINCUBATED FOR BETTER GROWTH Performed at [facility], [place] [address], [place], [place] [address]    Report Status PENDING  Incomplete         Radiology Studies: Dg Chest 2 View  Result Date: 08/14/2018 CLINICAL  DATA:  Dizziness and sleepiness. EXAM: CHEST - 2 VIEW COMPARISON:  05/20/2018 FINDINGS: The cardiac silhouette is enlarged. Mediastinal contours appear intact. Bilateral small pleural effusions. Worsening of right middle and bilateral lower lobe airspace consolidation. Osseous structures are without acute abnormality. Soft tissues are grossly normal. IMPRESSION: Worsening airspace consolidation in the right middle lobe and bilateral lower lobes. Bilateral  small pleural effusions. Enlarged cardiac silhouette. Electronically Signed   By: Fidela Salisbury M.D.   On: 08/14/2018 17:50        Scheduled Meds: . apixaban  5 mg Oral BID  . atorvastatin  10 mg Oral q1800  . feeding supplement (ENSURE ENLIVE)  237 mL Oral TID BM  . furosemide  40 mg Intravenous BID  . levothyroxine  25 mcg Oral QAC breakfast  . multivitamin with minerals  1 tablet Oral Daily  . potassium chloride  40 mEq Oral Daily   Continuous Infusions: . sodium chloride 10 mL/hr at 08/16/18 0603  . ceFEPime (MAXIPIME) IV 1 g (08/16/18 1301)  . vancomycin 750 mg (08/16/18 1047)     LOS: 1 day     Vernell Leep, MD, FACP, [facility]. [facility] Pager [phone]  If 7PM-7AM, please contact night-coverage [email or web address] Password [email or web address] 08/16/2018, 2:15 PM

## 2018-08-16 NOTE — Consult Note (Signed)
NEURO HOSPITALIST CONSULT NOTE   Requesting physician: Dr. Algis Liming  Reason for Consult: Jerking spells during sleep which patient attributes to Cymbalta.   History obtained from:  Patient and Chart     HPI:                                                                                                                                          Charles Hawkins is an 47 y.o. male with restrictive cardiomyopathy, atrial fibrillation on Eliquis, recent stroke and chronic pain, who is followed at [facility] for possible heart transplant, who presented to [facility] late Thursday afternoon for evaluation of severe somnolence, dizziness and jerking spells while sleeping.   The patient attributes the jerking spells to Cymbalta which he started about 4 weeks ago for treatment of chronic foot pain. He states that after starting the medication, he became more somnolent and also started having visual hallucinations out of the corner of his eye of a moving white haze or shadow that would disappear when he tried to look at it. He also began waking up suddenly during his sleep with the sensation of falling and would flail his arms outwards reflexively in response to that sensation, sometimes knocking things over. He states the falling sensations with arm flailing increased in frequency and only occurred while sleeping. He states he would suddenly awaken from sleep when these occurred and would be alert at the time. His worst spell occurred last Sunday, with 6-8 seconds of jerking of his upper extremities and eyes rolled back in his head according to what his girlfriend told him - he states he does not recall any jerking, that he was awake at the time and looking at his girlfriend, but that it felt like he was having another one of his jerking spells "the worst one yet". He then stopped the Cymbalta (on Sunday) and had about 2 more days of jerking spells during sleep, that then went away. He went to see his PCP  about this on Thursday and was advised to go to the ED for evaluation.   Past Medical History:  Diagnosis Date  . Arthritis   . CHF (congestive heart failure) ([place])   . HTN (hypertension)   . 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 Multiple areas of subendocardial enhancement on MRI. Fat pad and myocardial biopsy negative at [facility].   . Stroke [facility])     Past Surgical History:  Procedure Laterality Date  . IR ANGIO VERTEBRAL SEL SUBCLAVIAN INNOMINATE UNI R MOD SED  05/18/2018  . IR CT HEAD LTD  05/18/2018  . IR PERCUTANEOUS ART THROMBECTOMY/INFUSION INTRACRANIAL INC DIAG ANGIO  05/18/2018  . RADIOLOGY WITH ANESTHESIA N/A 05/18/2018   Procedure: RADIOLOGY WITH ANESTHESIA;  Surgeon: Luanne Bras, MD;  Location: [facility]  OR;  Service: Radiology;  Laterality: N/A;    Family History  Problem Relation Age of Onset  . Heart failure Son 1       s/p heart transplant  . Stroke Brother 9       Deceased  . Stroke Mother   . Hypertension Father               Social History:  reports that he has quit smoking. His smoking use included cigarettes. He smoked 0.25 packs per day. He has never used smokeless tobacco. He reports that he drinks about 1.0 standard drinks of alcohol per week. He reports that he has current or past drug history. Drug: Marijuana.  Allergies  Allergen Reactions  . Lyrica [Pregabalin] Anaphylaxis  . Nsaids Other (See Comments)    MEDICATIONS:                                                                                                                     Scheduled: . apixaban  5 mg Oral BID  . atorvastatin  10 mg Oral q1800  . feeding supplement (ENSURE ENLIVE)  237 mL Oral TID BM  . furosemide  40 mg Intravenous BID  . levothyroxine  25 mcg Oral QAC breakfast  . multivitamin with minerals  1 tablet Oral Daily  . potassium chloride  40 mEq Oral Daily   Continuous: . sodium chloride 10 mL/hr at 08/16/18 0603  . ceFEPime (MAXIPIME) IV 1 g (08/16/18 0604)  .  vancomycin Stopped (08/16/18 0324)     ROS:                                                                                                                                       Does not endorse any weakness or sensory loss. Other ROS as per HPI.    Blood pressure 105/78, pulse 96, temperature 98.3 F (36.8 C), temperature source Oral, resp. rate 20, height 5\' 8"  (1.727 m), weight 65.8 kg, SpO2 98 %.   General Examination:  Physical Exam  HEENT-  [place]/AT    Lungs- Respirations unlabored Heart: Irregularly irregular rate and rhythm Extremities- No edema  Neurological Examination Mental Status: Alert, oriented, thought content appropriate.  Speech fluent without evidence of aphasia.  Able to follow all commands without difficulty. Good insight.  Cranial Nerves: II: Visual fields grossly normal without extinction to DSS. Fixates normally.   III,IV, VI: EOMI without nystagmus. No ptosis.  V,VII: Temp sensation equal bilaterally. No facial droop.  VIII: hearing intact to conversation IX,X: No hypophonia or hoarseness XI: Head midline XII: No lingual dysarthria.  Motor: 5/5 x 4 proximally and distally.  Sensory: Temp intact all 4 extremities proximally.  Deep Tendon Reflexes: 1+ bilateral upper and lower extremities.  Cerebellar: No ataxia with FNF bilaterally Gait: Deferred   Lab Results: Basic Metabolic Panel: Recent Labs  Lab 08/14/18 1717 08/15/18 0434 08/16/18 0600  NA 126* 127* 130*  K 3.8 3.8 3.7  CL 89* 93* 92*  CO2 26 20* 24  GLUCOSE 92 56* 90  BUN 7 6 7   CREATININE 0.80 0.72 0.73  CALCIUM 8.2* 8.1* 8.2*  MG  --  1.8  --     CBC: Recent Labs  Lab 08/14/18 1717 08/15/18 0434 08/15/18 0652 08/15/18 0934 08/16/18 0600  WBC 10.1 6.2 5.8 5.8 4.3  NEUTROABS  --  4.0  --   --   --   HGB 12.9* 12.8* 11.9* 12.5* 13.1  HCT 38.7* 37.3* 36.3* 37.9* 38.6*  MCV  84.3 84.2 84.6 85.0 83.2  PLT 516* 447* 489* 472* 576*    Cardiac Enzymes: Recent Labs  Lab 08/15/18 0434  TROPONINI 0.06*    Lipid Panel: No results for input(s): CHOL, TRIG, HDL, CHOLHDL, VLDL, LDLCALC in the last 168 hours.  Imaging: Dg Chest 2 View  Result Date: 08/14/2018 CLINICAL DATA:  Dizziness and sleepiness. EXAM: CHEST - 2 VIEW COMPARISON:  05/20/2018 FINDINGS: The cardiac silhouette is enlarged. Mediastinal contours appear intact. Bilateral small pleural effusions. Worsening of right middle and bilateral lower lobe airspace consolidation. Osseous structures are without acute abnormality. Soft tissues are grossly normal. IMPRESSION: Worsening airspace consolidation in the right middle lobe and bilateral lower lobes. Bilateral small pleural effusions. Enlarged cardiac silhouette. Electronically Signed   By: Fidela Salisbury M.D.   On: 08/14/2018 17:50    Assessment: 47 year old male with jerking spells during sleep 1. The spells occurred while awake and subjectively alert, without postictal confusion. Overall, the spells are more likely due to medication side effect (Cymbalta) than seizure. The spells have stopped since he discontinued Cymbalta. The description of the spells are most consistent with hypnic jerks (sleep starts), except for the last spell which lasted 6-8 seconds but during which the patient was aware but appeared unresponsive to his girlfriend - most likely the latter is a sleep-associated phenomenon and may have been due to changes in serotonin-norepinephrine neurotransmission due to the Cymbalta use, but other than that I cannot provide a clear explanation other than possibly an atypical seizure.  2. The spells may have been precipitated by the Cymbalta as there is a temporal relationship of the spells with the starting and stopping of this medication in this patient. Of note, another antidepressant, escitalopram, is documented in the literature as being  associated with hypnic jerks, but I could not find an article regarding this side effect and Cymbalta specifically. 3. Infiltrative cardiomyopathy with CHF and atrial fibrillation. He is anticoagulated with apixaban.  4. History of stroke.   Recommendations:  1. Continue off Cymbalta.  2. EEG. If EEG positive for epileptiform or interictal discharges, then will start an anticonvulsant..  3. Given that the sleep related spell of 6-8 seconds occurred once and was not associated with LOC, if EEG is negative anticonvulsant treatment would not be strongly indicated.  If he has a recurrent spell he should be re-evaluated and a repeat EEG should be performed.    Electronically signed: Dr. Kerney Elbe 08/16/2018, 9:56 AM

## 2018-08-16 NOTE — Progress Notes (Signed)
Progress Note  Patient Name: Charles Hawkins Date of Encounter: 08/16/2018  Primary Cardiologist: CHF/[facility]  Subjective   No complaints this am   Inpatient Medications    Scheduled Meds: . apixaban  5 mg Oral BID  . atorvastatin  10 mg Oral q1800  . feeding supplement (ENSURE ENLIVE)  237 mL Oral TID BM  . furosemide  40 mg Intravenous BID  . levothyroxine  25 mcg Oral QAC breakfast  . multivitamin with minerals  1 tablet Oral Daily  . potassium chloride  40 mEq Oral Daily   Continuous Infusions: . sodium chloride 10 mL/hr at 08/16/18 0603  . ceFEPime (MAXIPIME) IV 1 g (08/16/18 0604)  . vancomycin Stopped (08/16/18 0324)   PRN Meds: sodium chloride, acetaminophen **OR** acetaminophen   Vital Signs    Vitals:   08/15/18 2039 08/16/18 0019 08/16/18 0452 08/16/18 0453  BP: 102/61 93/61  106/76  Pulse: 81 76  81  Resp: 20 16  20   Temp: 98.1 F (36.7 C)   97.7 F (36.5 C)  TempSrc: Oral   Oral  SpO2: 100% 99%  (!) 89%  Weight:   65.8 kg   Height:        Intake/Output Summary (Last 24 hours) at 08/16/2018 0753 Last data filed at 08/16/2018 0736 Gross per 24 hour  Intake 729.67 ml  Output 1475 ml  Net -745.33 ml   Filed Weights   08/14/18 2340 08/15/18 0419 08/16/18 0452  Weight: 66.6 kg 65.2 kg 65.8 kg    Telemetry    Afib/flutter rates 80's  - Personally Reviewed  ECG    AFib/flutter course  - Personally Reviewed  Physical Exam  Black male  GEN: No acute distress.   Neck:JVP elevated  Cardiac: RRR, no murmurs, rubs, or gallops.  Respiratory: Clear to auscultation bilaterally. GI: Soft, nontender, non-distended  MS: No edema; No deformity. Neuro:  Nonfocal  Psych: Normal affect   Labs    Chemistry Recent Labs  Lab 08/14/18 1717 08/15/18 0434 08/15/18 1500 08/16/18 0600  NA 126* 127*  --  130*  K 3.8 3.8  --  3.7  CL 89* 93*  --  92*  CO2 26 20*  --  24  GLUCOSE 92 56*  --  90  BUN 7 6  --  7  CREATININE 0.80 0.72  --  0.73    CALCIUM 8.2* 8.1*  --  8.2*  PROT  --  6.7 6.7 6.8  ALBUMIN  --  2.3* 2.3* 2.2*  AST  --  44* 43* 53*  ALT  --  19 19 19   ALKPHOS  --  232* 232* 247*  BILITOT  --  5.3* 5.0* 4.0*  GFRNONAA >60 >60  --  >60  GFRAA >60 >60  --  >60  ANIONGAP 11 14  --  14     Hematology Recent Labs  Lab 08/15/18 0652 08/15/18 0934 08/16/18 0600  WBC 5.8 5.8 4.3  RBC 4.29  4.29 4.46 4.64  HGB 11.9* 12.5* 13.1  HCT 36.3* 37.9* 38.6*  MCV 84.6 85.0 83.2  MCH 27.7 28.0 28.2  MCHC 32.8 33.0 33.9  RDW 14.3 14.6 14.5  PLT 489* 472* 576*    Cardiac Enzymes Recent Labs  Lab 08/15/18 0434  TROPONINI 0.06*    Recent Labs  Lab 08/14/18 1738  TROPIPOC 0.02     BNPNo results for input(s): BNP, PROBNP in the last 168 hours.   DDimer No results for input(s): DDIMER in the  last 168 hours.   Radiology    Dg Chest 2 View  Result Date: 08/14/2018 CLINICAL DATA:  Dizziness and sleepiness. EXAM: CHEST - 2 VIEW COMPARISON:  05/20/2018 FINDINGS: The cardiac silhouette is enlarged. Mediastinal contours appear intact. Bilateral small pleural effusions. Worsening of right middle and bilateral lower lobe airspace consolidation. Osseous structures are without acute abnormality. Soft tissues are grossly normal. IMPRESSION: Worsening airspace consolidation in the right middle lobe and bilateral lower lobes. Bilateral small pleural effusions. Enlarged cardiac silhouette. Electronically Signed   By: Fidela Salisbury M.D.   On: 08/14/2018 17:50    Cardiac Studies   05/19/18  EF 55-60% mild to moderate MR diastolic not commented on severe biatrial enlargement  Patient Profile     47 y.o. male with restrictive cardiomyopathy and hypotension Chronic fib/flutter with CVA admitted With pneumonia   Assessment & Plan    Hypotension:  ? Related to chronic anemia and pneumonia Avoiding overdiuresis  Restrictive DCM:  Start aldactone if BP stays over 790 mmHg systolic in am intolerant to entresto Has f/u at  [facility] ? Transplant Pneumonia:  Continue iv antibiotics per primary service f/u CXR AFib:  Rate control ok continue anticoagulation        For questions or updates, please contact [place] Please consult [email or web address] for contact info under        Signed, Jenkins Rouge, MD  08/16/2018, 7:53 AM

## 2018-08-16 NOTE — Procedures (Signed)
ELECTROENCEPHALOGRAM REPORT  Date of Study: 08/16/18  Patient's Name: Charles Hawkins MRN: [id number] Date of Birth: [birth year]  Referring Provider: Dr Kerney Elbe  Clinical History: Charles Hawkins is a 47 y.o. M who presents with jerking spells during sleep.  Noted NF neurological exam.  No recent head imaging.   Medications: Scheduled Meds: . apixaban  5 mg Oral BID  . atorvastatin  10 mg Oral q1800  . feeding supplement (ENSURE ENLIVE)  237 mL Oral TID BM  . furosemide  40 mg Intravenous BID  . levothyroxine  25 mcg Oral QAC breakfast  . multivitamin with minerals  1 tablet Oral Daily  . potassium chloride  40 mEq Oral Daily   Continuous Infusions: . sodium chloride 10 mL/hr at 08/16/18 0603  . ceFEPime (MAXIPIME) IV 1 g (08/16/18 1301)   PRN Meds:.sodium chloride, acetaminophen **OR** acetaminophen            Technical Summary: This is a standard 16 channel EEG recording performed according to the international 10-20 electrode system.  AP bipolar, transverse bipolar, and referential montages were obtained, and digitally reformatted as necessary.  Duration of Tracing: 23:42  Description: In the awake state there is an 8 Hz alpha rhythm seen from the posterior head regions in a symmetric fashion.  Drowsiness is noted by dropout of background alpha and vertex slowing, however no definite stage II sleep was noted.  The patient was noted to have twitching of his Rt hand, and feet, during the tracing, however no epileptiform changes were noted.   Neither hyperventilation or photic stimulation were performed.  EKG was monitored and noted to be sinus rhythym with an average heart rate of 66 bpm.  No epileptiform changes were noted.  Impression: This is a normal EEG in the awake and drowsy states without any epileptiform abnormalities noted. Some twitches (hands and feet) were noted, but no epileptiform correlate for the events.  A single normal EEG does not exclude the  diagnosis of epilepsy.  Clinical correlation advised.   Carvel Getting, M.D. Neurology

## 2018-08-16 NOTE — Progress Notes (Signed)
EEG complete - results pending 

## 2018-08-17 LAB — GLUCOSE, CAPILLARY
Glucose-Capillary: 117 mg/dL — ABNORMAL HIGH (ref 70–99)
Glucose-Capillary: 100 mg/dL — ABNORMAL HIGH (ref 70–99)
Glucose-Capillary: 95 mg/dL (ref 70–99)

## 2018-08-17 LAB — BASIC METABOLIC PANEL
Anion gap: 12 (ref 5–15)
BUN: 5 mg/dL — ABNORMAL LOW (ref 6–20)
CO2: 26 mmol/L (ref 22–32)
Calcium: 8.5 mg/dL — ABNORMAL LOW (ref 8.9–10.3)
Chloride: 92 mmol/L — ABNORMAL LOW (ref 98–111)
Creatinine, Ser: 0.67 mg/dL (ref 0.61–1.24)
GFR calc Af Amer: >60 mL/min (ref >60)
GFR calc non Af Amer: >60 mL/min (ref >60)
Glucose, Bld: 82 mg/dL (ref 70–99)
Potassium: 3.7 mmol/L (ref 3.5–5.1)
Sodium: 130 mmol/L — ABNORMAL LOW (ref 135–145)

## 2018-08-17 LAB — CULTURE, RESPIRATORY W GRAM STAIN

## 2018-08-17 NOTE — Progress Notes (Signed)
EEG report reviewed: This is a normal EEG in the awake and drowsy states without any epileptiform abnormalities noted. Some twitches (hands and feet) were noted, but no epileptiform correlate for the events.   Would hold off on starting an anticonvulsant for now. Neurology will sign off. If he has a recurrent spell, please call Neurology for re-assessment.   Electronically signed: Dr. Kerney Elbe

## 2018-08-17 NOTE — Progress Notes (Signed)
PROGRESS NOTE   Charles Hawkins  ELF:[id number]    DOB: Feb 25, 1971    DOA: 08/14/2018  PCP: Benito Mccreedy, MD   I have briefly reviewed patients previous medical records in [facility].  Brief Narrative:  47 year old married male, lives with spouse and 3 kids, ambulates at times with the help of a cane when he has lower extremity joint pains, does not work, PMH of HTN, restrictive CMP (followed by Dr. Aundra Dubin and at [facility] for possible transplant), A. fib on Eliquis, bilateral clubfeet status post surgical repair, gout, arthritis, recent CVA and chronic pain, was recently started on Cymbalta approximately 4 weeks ago by his PCP for lower extremity pains, presented to [facility] ED on 08/14/2018 due to approximately 1 month history of intermittent dizziness, weakness, long-standing history of cough which is now productive of yellow/green sputum, low-grade fevers, chills, decreased appetite, mood swings, chronic orthopnea and a brief episode of involuntary jerking and eyes rolling while asleep 3 days PTA.  In ED, noted to be hypotensive in the 70s-80s, improved with IV fluids and admitted for suspected pneumonia.  Cardiology consulted.  For suspicion of seizure-like activity, Neurology consulted.  Assessment & Plan:   Principal Problem:   Hypotension Active Problems:   Acute on chronic diastolic CHF (congestive heart failure) (HCC)   Restrictive cardiomyopathy (HCC)   Hyponatremia   HCAP (healthcare-associated pneumonia)   Community acquired pneumonia   Protein-calorie malnutrition, severe   Hypotension Suspect due to dehydration from poor oral intake.  Clinically not septic.  Lactate normal.  Improved after IV fluids.  IV fluids held.  Lasix resumed by cardiology for heart failure.  SBP remain in the 90s but asymptomatic.  Suspected pneumonia/HCAP:  Blood cultures x2 negative to date.  MRSA PCR negative.  Sputum culture pending.  Empirically started on IV cefepime and vancomycin. PCT 0.94.   Discontinued vancomycin.  Day 4 IV antibiotics today, continue additional day of IV antibiotics then transition to oral antibiotics and possible discharge after cardiology clearance.  Hyponatremia: DD: Dehydration, Cymbalta.  He stopped taking Cymbalta 4 to 5 days PTA.  Hydrated with normal saline on admission, now off.  Asymptomatic.  Improved and stable.  Normocytic anemia Stable.?  Chronic disease.  No overt bleeding reported.  Acute on chronic diastolic CHF:  Care directed by cardiology, input appreciated.  TTE 7/19: LVEF 55-60%.  Clinically not volume overloaded except elevated JVD.  Cardiology initiated Lasix 40 mg IV twice daily.  JVD minimally elevated but better compared to 3 days ago.  Intake output not accurate.  Continue IV Lasix today and await cardiology follow-up in a.m. regarding DC home.  Atypical atrial flutter versus coarse A. fib, chronic Management per cardiology.  Rate controlled without medications.  Continue Eliquis.  Patient had CVA 7/19 in setting of missing at least a dose of Eliquis.  Elevated troponin Minimal, suspecting demand ischemia.  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  Marijuana use Reports that he was trying this to improve appetite.  Cessation counseled.  ?  Seizure-like activity Reportedly occurred 4 to 5 days PTA while he was asleep lasting ~ 8 secs and noted by his spouse.  Reported similar occurrence when on gabapentin.?  Mother with history of seizures.  Seizure precautions and monitor for seizures.?  Related to Cymbalta-now stopped by patient. Neurology consulted and as per history provided to them, the spells/hypnic jerks  occurred while awake and suspect Cymbalta side effect rather than seizures.  They recommend no Cymbalta.  EEG negative for seizures, no indication for AEDs, neurology signed  off.  CVA 04/2018 No residual deficits.  Continue Eliquis and statins.  Hyperbilirubinemia Unclear etiology.  Obtain split bilirubin/almost even.  Follow LFTs closely, improving.?  Passive hepatic congestion from CHF.  No GI symptoms reported.  Severe malnutrition in context of acute illness/injury Management per RD input on 10/18.  Hypothyroid Clinically euthyroid.  Continue Synthroid.  Low blood glucose:  56 on BMP on 10/18.  Patient again had CBG of 59 last night of unclear etiology.  Advised patient to snack between meals and at bedtime.  Monitor.   DVT prophylaxis: Eliquis Code Status: Full Family Communication: None at bedside Disposition: DC home pending further clinical improvement and cardiology clearance from CHF standpoint, possibly 10/21.   Consultants:  Cardiology  Procedures:  None  Antimicrobials:  IV cefepime and vancomycin   Subjective: Still has some orthopnea but much improved compared to admission.  Cough has improved, sputum now less and creamy colored.  No chest pain.  No dizziness or lightheadedness.  ROS: As above, otherwise negative.  Objective:  Vitals:   08/17/18 0403 08/17/18 0753 08/17/18 0800 08/17/18 1131  BP:  92/73 99/62 94/67   Pulse:  (!) 50 62 72  Resp:      Temp:  (!) 97.4 F (36.3 C) 98.2 F (36.8 C) 98.2 F (36.8 C)  TempSrc:  Oral Oral Oral  SpO2:  98% 99% 98%  Weight: 65.2 kg     Height:        Examination:  General exam: Pleasant young male, moderately built and thinly nourished lying comfortably propped up in bed without distress.  Stable Respiratory system: Improved breath sounds.  Occasional basal crackles but otherwise clear to auscultation.  No increased work of breathing.  Stable Cardiovascular system: S1 & S2 heard, RRR. No murmurs, rubs, gallops or clicks. No pedal edema.  Mild JVD.  Telemetry personally reviewed: Atrial flutter with controlled ventricular rate and occasional PVCs. Gastrointestinal system:  Abdomen is nondistended, soft and nontender. No organomegaly or masses felt. Normal bowel sounds heard.  Stable Central nervous system: Alert and oriented. No focal neurological deficits.  Stable Extremities: Symmetric 5 x 5 power. Skin: No rashes, lesions or ulcers Psychiatry: Judgement and insight appear normal. Mood & affect appropriate.     Data Reviewed: I have personally reviewed following labs and imaging studies  CBC: Recent Labs  Lab 08/14/18 1717 08/15/18 0434 08/15/18 0652 08/15/18 0934 08/16/18 0600  WBC 10.1 6.2 5.8 5.8 4.3  NEUTROABS  --  4.0  --   --   --   HGB 12.9* 12.8* 11.9* 12.5* 13.1  HCT 38.7* 37.3* 36.3* 37.9* 38.6*  MCV 84.3 84.2 84.6 85.0 83.2  PLT 516* 447* 489* 472* 431*   Basic Metabolic Panel: Recent Labs  Lab 08/14/18 1717 08/15/18 0434 08/16/18 0600 08/17/18 0446  NA 126* 127* 130* 130*  K 3.8 3.8 3.7 3.7  CL 89* 93* 92* 92*  CO2 26 20* 24 26  GLUCOSE 92 56* 90 82  BUN 7 6 7  5*  CREATININE 0.80 0.72 0.73 0.67  CALCIUM 8.2* 8.1* 8.2* 8.5*  MG  --  1.8  --   --    Liver Function Tests: Recent Labs  Lab 08/15/18 0434 08/15/18 1500 08/16/18 0600  AST 44* 43* 53*  ALT 19 19 19   ALKPHOS 232* 232* 247*  BILITOT 5.3*  5.0* 4.0*  PROT 6.7 6.7 6.8  ALBUMIN 2.3* 2.3* 2.2*   Cardiac Enzymes: Recent Labs  Lab 08/15/18 0434  TROPONINI 0.06*   CBG: Recent Labs  Lab 08/16/18 1137 08/16/18 1624 08/16/18 2132 08/16/18 2207 08/17/18 1138  GLUCAP 94 88 59* 91 117*    Recent Results (from the past 240 hour(s))  Blood Culture (routine x 2)     Status: None (Preliminary result)   Collection Time: 08/14/18  8:38 PM  Result Value Ref Range Status   Specimen Description BLOOD LEFT ANTECUBITAL  Final   Special Requests   Final    BOTTLES DRAWN AEROBIC AND ANAEROBIC Blood Culture adequate volume   Culture   Final    NO GROWTH 2 DAYS Performed at [facility], [place] [address] [place], [place] [address]    Report Status PENDING   Incomplete  Blood Culture (routine x 2)     Status: None (Preliminary result)   Collection Time: 08/14/18  8:40 PM  Result Value Ref Range Status   Specimen Description BLOOD RIGHT ANTECUBITAL  Final   Special Requests   Final    BOTTLES DRAWN AEROBIC AND ANAEROBIC Blood Culture adequate volume   Culture   Final    NO GROWTH 2 DAYS Performed at [facility], [place] [address], [place], [place] [address]    Report Status PENDING  Incomplete  MRSA PCR Screening     Status: None   Collection Time: 08/14/18 11:36 PM  Result Value Ref Range Status   MRSA by PCR NEGATIVE NEGATIVE Final    Comment:        The GeneXpert MRSA Assay (FDA approved for NASAL specimens only), is one component of a comprehensive MRSA colonization surveillance program. It is not intended to diagnose MRSA infection nor to guide or monitor treatment for MRSA infections. Performed at [facility], [place] [address], [place], [place] [address]   Culture, sputum-assessment     Status: None   Collection Time: 08/15/18  7:00 AM  Result Value Ref Range Status   Specimen Description SPUTUM  Final   Special Requests NONE  Final   Sputum evaluation   Final    THIS SPECIMEN IS ACCEPTABLE FOR SPUTUM CULTURE Performed at [facility], [address], [place], [place] [address]    Report Status 08/15/2018 FINAL  Final  Gram stain     Status: None   Collection Time: 08/15/18  7:00 AM  Result Value Ref Range Status   Specimen Description URINE, RANDOM  Final   Special Requests NONE  Final   Gram Stain   Final    NO WBC SEEN NO ORGANISMS SEEN CYTOSPIN SMEAR Performed at [facility], [place] [address], [place], [place] [address]    Report Status 08/15/2018 FINAL  Final  Culture, respiratory     Status: None   Collection Time: 08/15/18  7:00 AM  Result Value Ref Range Status   Specimen Description SPUTUM  Final   Special Requests NONE Reflexed from [id number]  Final   Gram Stain   Final    MODERATE  WBC PRESENT,BOTH PMN AND MONONUCLEAR FEW SQUAMOUS EPITHELIAL CELLS PRESENT FEW GRAM POSITIVE RODS FEW GRAM NEGATIVE RODS FEW GRAM VARIABLE ROD FEW GRAM POSITIVE COCCI IN PAIRS    Culture   Final    CULTURE REINCUBATED FOR BETTER GROWTH Consistent with normal respiratory flora. Performed at [facility], [place] [address] [place], [place] [address]  Report Status 08/17/2018 FINAL  Final         Radiology Studies: No results found.      Scheduled Meds: . apixaban  5 mg Oral BID  . atorvastatin  10 mg Oral q1800  . feeding supplement (ENSURE ENLIVE)  237 mL Oral TID BM  . furosemide  40 mg Intravenous BID  . levothyroxine  25 mcg Oral QAC breakfast  . multivitamin with minerals  1 tablet Oral Daily  . potassium chloride  40 mEq Oral Daily   Continuous Infusions: . sodium chloride 10 mL/hr at 08/16/18 0603  . ceFEPime (MAXIPIME) IV 1 g (08/17/18 0528)     LOS: 2 days     Vernell Leep, MD, FACP, [facility]. [facility] Pager [phone]  If 7PM-7AM, please contact night-coverage [email or web address] Password [email or web address] 08/17/2018, 12:21 PM

## 2018-08-18 LAB — COMPREHENSIVE METABOLIC PANEL
ALT: 26 U/L (ref 0–44)
AST: 78 U/L — ABNORMAL HIGH (ref 15–41)
Albumin: 2.3 g/dL — ABNORMAL LOW (ref 3.5–5.0)
Alkaline Phosphatase: 238 U/L — ABNORMAL HIGH (ref 38–126)
Anion gap: 9 (ref 5–15)
BUN: 9 mg/dL (ref 6–20)
CO2: 25 mmol/L (ref 22–32)
Calcium: 8.3 mg/dL — ABNORMAL LOW (ref 8.9–10.3)
Chloride: 94 mmol/L — ABNORMAL LOW (ref 98–111)
Creatinine, Ser: 0.68 mg/dL (ref 0.61–1.24)
GFR calc Af Amer: >60 mL/min (ref >60)
GFR calc non Af Amer: >60 mL/min (ref >60)
Glucose, Bld: 95 mg/dL (ref 70–99)
Potassium: 3.9 mmol/L (ref 3.5–5.1)
Sodium: 128 mmol/L — ABNORMAL LOW (ref 135–145)
Total Bilirubin: 2.6 mg/dL — ABNORMAL HIGH (ref 0.3–1.2)
Total Protein: 7.2 g/dL (ref 6.5–8.1)

## 2018-08-18 LAB — GLUCOSE, CAPILLARY
Glucose-Capillary: 93 mg/dL (ref 70–99)
Glucose-Capillary: 94 mg/dL (ref 70–99)
Glucose-Capillary: 99 mg/dL (ref 70–99)
Glucose-Capillary: 91 mg/dL (ref 70–99)
Glucose-Capillary: 129 mg/dL — ABNORMAL HIGH (ref 70–99)

## 2018-08-18 MED ORDER — FUROSEMIDE 10 MG/ML IJ SOLN
80.0000 mg | Freq: Two times a day (BID) | INTRAMUSCULAR | Status: DC
  Administered 2018-08-18 – 2018-08-19 (×3): 80 mg via INTRAVENOUS
  Filled 2018-08-18 (×3): qty 8

## 2018-08-18 MED ORDER — AMOXICILLIN-POT CLAVULANATE 875-125 MG PO TABS
1.0000 | ORAL_TABLET | Freq: Two times a day (BID) | ORAL | Status: DC
  Administered 2018-08-18 – 2018-08-19 (×2): 1 via ORAL
  Filled 2018-08-18 (×2): qty 1

## 2018-08-18 NOTE — Progress Notes (Signed)
Heart Failure Navigator Consult Note  Presentation:  Per Dr. Aundra Dubin: Charles Hawkins is a 47 y.o. male with h/o HTN, restrictive CMP, Afib, h/o bilateral club feet s/p surgical repair, gout, arthritis, h/o CVA on Eliquis, and chronic pain.   Seen for Acute CVA in 05/19/18. He had only missed one dose of Eliquis as outpatient. Had mild left facial droop as only persistent deficit. Was supposed to follow up with Dr Posey Pronto at [facility] in the transplant center, but has moved to [facility]  so never did.   Pt presented to [facility] 08/14/18 with worsening SOB and productive cough. Found to be hypotensive in the 70-80s. CXR showed RLL and Bilateral lower lobes in concerning for PNA. Started on Vanc/Cefepime. Pertinent Cr 0.80, K 3.8, WBC 6.2, Hgb 12.8, Lactic acid 1.7, and PCT 0.94. Na 126. Initial troponin 0.06. Blood cultures sent.   Feeling better today. He states he was started Cymbalta for his chronic leg pain about 6 weeks ago. Did great for about 4 weeks, but then started feeling "bad". Had sleeplessness, malaise, fatigue, night sweats, and "jerking" in his sleep. He stopped Cymbalta.  He states 3-4 days leading up to hospitalization he was feeling worse, with cough, and fever up to 101 at home. Weight runs "150-160" at home, but Weight down to 143 here. He states he has also had poor appetite. He has not followed up at [facility]. Denies orthopnea. ? PND with his symptoms above. He states he was taking his medicines as directed, but without follow up, was never restarted on ARB/ARNi, spiro, or BB. Has not needed extra diuretics. Denies sick contacts.   Echo 04/2018 LVEF 55-60%, Mild/Mod central MR, Mod/Sev LAE, Mod RV systolic reduction, Severe RAE, PA peak pressure 68 mm Hg.    Past Medical History:  Diagnosis Date  . Arthritis   . CHF (congestive heart failure) ([place])   . HTN (hypertension)   . 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 Multiple areas of subendocardial enhancement on MRI. Fat pad and  myocardial biopsy negative at [facility].   . Stroke [facility])     Social History   Socioeconomic History  . Marital status: Single    Spouse name: Not on file  . Number of children: Not on file  . Years of education: Not on file  . Highest education level: Not on file  Occupational History  . Not on file  Social Needs  . Financial resource strain: Not on file  . Food insecurity:    Worry: Not on file    Inability: Not on file  . Transportation needs:    Medical: Not on file    Non-medical: Not on file  Tobacco Use  . Smoking status: Former Smoker    Packs/day: 0.25    Types: Cigarettes  . Smokeless tobacco: Never Used  Substance and Sexual Activity  . Alcohol use: Yes    Alcohol/week: 1.0 standard drinks    Types: 1 Cans of beer per week    Comment: occasionally  . Drug use: Yes    Types: Marijuana    Comment: marijuana occasionally  . Sexual activity: Not on file  Lifestyle  . Physical activity:    Days per week: Not on file    Minutes per session: Not on file  . Stress: Not on file  Relationships  . Social connections:    Talks on phone: Not on file    Gets together: Not on file    Attends religious service: Not on  file    Active member of club or organization: Not on file    Attends meetings of clubs or organizations: Not on file    Relationship status: Not on file  Other Topics Concern  . Not on file  Social History Narrative  . Not on file    ECHO:Study Conclusions--05/19/18  - Left ventricle: The cavity size was normal. There was moderate   concentric hypertrophy. Systolic function was normal. The   estimated ejection fraction was in the range of 55% to 60%. Wall   motion was normal; there were no regional wall motion   abnormalities. - Mitral valve: Mild, late systolicprolapse, involving the anterior   leaflet and the posterior leaflet. There was mild to moderate   regurgitation directed centrally. - Left atrium: The atrium was moderately to severely  dilated. - Right ventricle: Systolic function was moderately reduced. - Right atrium: The atrium was severely dilated. - Pulmonary arteries: Systolic pressure was moderately increased.   PA peak pressure: 68 mm Hg (S).  Impressions:  - Compared to the 2016 study, left ventricular systolic function   has normalized, but there is persistent pulmonary artery   hypertension and there is evidence of right ventricular   dysfunction and increased right atrial pressure.  BNP    Component Value Date/Time   BNP 1,202.5 (H) 04/21/2015 1040    ProBNP No results found for: PROBNP   Education Assessment and Provision:  Detailed education and instructions provided on heart failure disease management including the following:  Signs and symptoms of Heart Failure When to call the physician Importance of daily weights Low sodium diet Fluid restriction Medication management Anticipated future follow-up appointments  Patient education given on each of the above topics.  Patient acknowledges understanding and acceptance of all instructions.  I discussed HF diagnosis as well as current hospitalization.  He acknowledges that he has been putting others before his own health care and recently "fell off".  We discussed the importance of daily weights.  I reviewed low sodium diet and high sodium foods to avoid.  He denies any issues getting or taking prescribed medications.  He will follow in the [facility] after discharge.  Education Materials:  "Living Better With Heart Failure" Booklet, Daily Weight Tracker Tool    High Risk Criteria for Readmission and/or Poor Patient Outcomes:   EF <30%- 55-60%  2 or more admissions in 6 months- yes  Difficult social situation- no--denies  Demonstrates medication noncompliance-denies    Barriers of Care:  Knowledge and compliance  Discharge Planning:  Plans to return to home with family in [place].  I reviewed the HF [employer] and he is considering.

## 2018-08-18 NOTE — Care Management Important Message (Signed)
Important Message  Patient Details  Name: Charles Hawkins MRN: [id number] Date of Birth: 21-Oct-1971   Medicare Important Message Given:  Yes    Berenize Gatlin P Brandye Inthavong 08/18/2018, 1:48 PM

## 2018-08-18 NOTE — Progress Notes (Signed)
Patient ReDs clip reading completed = 42%.

## 2018-08-18 NOTE — Progress Notes (Signed)
PROGRESS NOTE   Charles Hawkins  SLH:[id number]    DOB: 06/14/1971    DOA: 08/14/2018  PCP: Benito Mccreedy, MD   I have briefly reviewed patients previous medical records in [facility].  Brief Narrative:  47 year old married male, lives with spouse and 3 kids, ambulates at times with the help of a cane when he has lower extremity joint pains, does not work, PMH of HTN, restrictive CMP (followed by Dr. Aundra Dubin and at [facility] for possible transplant), A. fib on Eliquis, bilateral clubfeet status post surgical repair, gout, arthritis, recent CVA and chronic pain, was recently started on Cymbalta approximately 4 weeks ago by his PCP for lower extremity pains, presented to [facility] ED on 08/14/2018 due to approximately 1 month history of intermittent dizziness, weakness, long-standing history of cough which is now productive of yellow/green sputum, low-grade fevers, chills, decreased appetite, mood swings, chronic orthopnea and a brief episode of involuntary jerking and eyes rolling while asleep 3 days PTA.  In ED, noted to be hypotensive in the 70s-80s, improved with IV fluids and admitted for suspected pneumonia.  Cardiology consulted.  For suspicion of seizure-like activity, Neurology consulted.  Assessment & Plan:   Principal Problem:   Hypotension Active Problems:   Acute on chronic diastolic CHF (congestive heart failure) (HCC)   Restrictive cardiomyopathy (HCC)   Hyponatremia   HCAP (healthcare-associated pneumonia)   Community acquired pneumonia   Protein-calorie malnutrition, severe   Hypotension Present on admission.  Suspect due to dehydration from poor oral intake.  Clinically not septic.  Lactate normal.  Improved after IV fluids.  Improved and stable.  Suspected pneumonia/HCAP:  Blood cultures x2 negative to date.  MRSA PCR negative.  Sputum culture pending.  Empirically started on IV cefepime and vancomycin. PCT 0.94.  Discontinued vancomycin.  Completed 4 days IV antibiotics.   Transitioned to oral Augmentin to complete total 7 days course.  Recommend follow-up chest x-ray in 4 weeks.  Hyponatremia: DD: Dehydration, Cymbalta.  He stopped taking Cymbalta 4 to 5 days PTA.  Hydrated with normal saline on admission, now off.  Asymptomatic.  Although sodium is dropped from 130-128, still stable.  Follow BMP in a.m.  Normocytic anemia Stable.?  Chronic disease.  No overt bleeding reported.  Acute on chronic diastolic CHF:  Care directed by cardiology, input appreciated.  TTE 7/19: LVEF 55-60%.  Clinically not volume overloaded except elevated JVD.  Cardiology initiated Lasix 40 mg IV twice daily.  JVD minimally elevated but better compared to 3 days ago.  Suspect intake output recording is inaccurate.  I discussed with CHF team, they suspect ongoing volume overload and have increased IV Lasix to 80 mg twice daily.  Defer management to Cardiology.  Atypical atrial flutter versus coarse A. fib, chronic Management per cardiology.  Rate controlled without medications.  Continue Eliquis.  Patient had CVA 7/19 in setting of missing at least a dose of Eliquis.  Elevated troponin Minimal, suspecting demand ischemia.  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  Marijuana use Reports that he was trying this to improve appetite.  Cessation counseled.  ?  Seizure-like activity Reportedly occurred 4 to 5 days PTA while he was asleep lasting ~ 8 secs and noted by his spouse.  Reported similar occurrence when on gabapentin.?  Mother with history of seizures.  Seizure precautions and monitor for seizures.?  Related to Cymbalta-now stopped  by patient. Neurology consulted and as per history provided to them, the spells/hypnic jerks occurred while awake and suspect Cymbalta side effect rather than seizures.  They recommend no Cymbalta.  EEG negative  for seizures, no indication for AEDs, neurology signed off.  CVA 04/2018 No residual deficits.  Continue Eliquis and statins.  Hyperbilirubinemia Unclear etiology.  Obtain split bilirubin/almost even.  Follow LFTs closely, improving.?  Passive hepatic congestion from CHF.  No GI symptoms reported.  Total bilirubin down from 5.3 on admission to 2.6.  Severe malnutrition in context of acute illness/injury Management per RD input on 10/18.  Hypothyroid Clinically euthyroid.  Continue Synthroid.  Low blood glucose:  56 on BMP on 10/18.  Patient again had CBG of 59 last night of unclear etiology.  Advised patient to snack between meals and at bedtime.  Monitor.  No further episodes in the last 24 hours.   DVT prophylaxis: Eliquis Code Status: Full Family Communication: None at bedside Disposition: DC home pending further clinical improvement and cardiology clearance from CHF standpoint.   Consultants:  Cardiology  Procedures:  None  Antimicrobials:  IV cefepime and vancomycin-discontinue Oral Augmentin  Subjective: Reports that he slept mostly on the recliner last night due to leg cramps.  Denies dyspnea.  Orthopnea improved.  Cough much improved, minimal and intermittent tan-colored sputum.  ROS: As above, otherwise negative.  Objective:  Vitals:   08/18/18 0417 08/18/18 0419 08/18/18 1022 08/18/18 1134  BP: 94/73  108/77 102/85  Pulse: 80  78 84  Resp:      Temp: 98 F (36.7 C)  97.6 F (36.4 C) 98 F (36.7 C)  TempSrc: Oral  Oral Oral  SpO2: 93%  98% 99%  Weight:  66.3 kg    Height:        Examination:  General exam: Pleasant young male, moderately built and thinly nourished lying comfortably propped up in bed without distress.  Stable Respiratory system: Very occasional basal crackles.  Otherwise clear to auscultation. Cardiovascular system: S1 & S2 heard, RRR. No murmurs, rubs, gallops or clicks. No pedal edema.  Mild JVD.  Telemetry personally reviewed: A.  fib with controlled ventricular rate. Gastrointestinal system: Abdomen is nondistended, soft and nontender. No organomegaly or masses felt. Normal bowel sounds heard.  Stable Central nervous system: Alert and oriented. No focal neurological deficits.  Stable Extremities: Symmetric 5 x 5 power. Skin: No rashes, lesions or ulcers Psychiatry: Judgement and insight appear normal. Mood & affect appropriate.     Data Reviewed: I have personally reviewed following labs and imaging studies  CBC: Recent Labs  Lab 08/14/18 1717 08/15/18 0434 08/15/18 0652 08/15/18 0934 08/16/18 0600  WBC 10.1 6.2 5.8 5.8 4.3  NEUTROABS  --  4.0  --   --   --   HGB 12.9* 12.8* 11.9* 12.5* 13.1  HCT 38.7* 37.3* 36.3* 37.9* 38.6*  MCV 84.3 84.2 84.6 85.0 83.2  PLT 516* 447* 489* 472* 431*   Basic Metabolic Panel: Recent Labs  Lab 08/14/18 1717 08/15/18 0434 08/16/18 0600 08/17/18 0446 08/18/18 0404  NA 126* 127* 130* 130* 128*  K 3.8 3.8 3.7 3.7 3.9  CL 89* 93* 92* 92* 94*  CO2 26 20* 24 26 25   GLUCOSE 92 56* 90 82 95  BUN 7 6 7  5* 9  CREATININE 0.80 0.72 0.73 0.67 0.68  CALCIUM 8.2* 8.1* 8.2* 8.5* 8.3*  MG  --  1.8  --   --   --    Liver Function  Tests: Recent Labs  Lab 08/15/18 0434 08/15/18 1500 08/16/18 0600 08/18/18 0404  AST 44* 43* 53* 78*  ALT 19 19 19 26   ALKPHOS 232* 232* 247* 238*  BILITOT 5.3* 5.0* 4.0* 2.6*  PROT 6.7 6.7 6.8 7.2  ALBUMIN 2.3* 2.3* 2.2* 2.3*   Cardiac Enzymes: Recent Labs  Lab 08/15/18 0434  TROPONINI 0.06*   CBG: Recent Labs  Lab 08/17/18 1138 08/17/18 1626 08/17/18 2138 08/18/18 0741 08/18/18 1133  GLUCAP 117* 100* 95 94 99    Recent Results (from the past 240 hour(s))  Blood Culture (routine x 2)     Status: None (Preliminary result)   Collection Time: 08/14/18  8:38 PM  Result Value Ref Range Status   Specimen Description BLOOD LEFT ANTECUBITAL  Final   Special Requests   Final    BOTTLES DRAWN AEROBIC AND ANAEROBIC Blood Culture  adequate volume   Culture   Final    NO GROWTH 4 DAYS Performed at [facility], [place] [address] [place], [place] [address]    Report Status PENDING  Incomplete  Blood Culture (routine x 2)     Status: None (Preliminary result)   Collection Time: 08/14/18  8:40 PM  Result Value Ref Range Status   Specimen Description BLOOD RIGHT ANTECUBITAL  Final   Special Requests   Final    BOTTLES DRAWN AEROBIC AND ANAEROBIC Blood Culture adequate volume   Culture   Final    NO GROWTH 4 DAYS Performed at [facility], [place] [address], [place], [place] [address]    Report Status PENDING  Incomplete  MRSA PCR Screening     Status: None   Collection Time: 08/14/18 11:36 PM  Result Value Ref Range Status   MRSA by PCR NEGATIVE NEGATIVE Final    Comment:        The GeneXpert MRSA Assay (FDA approved for NASAL specimens only), is one component of a comprehensive MRSA colonization surveillance program. It is not intended to diagnose MRSA infection nor to guide or monitor treatment for MRSA infections. Performed at [facility], [place] [address] [place], [place] [address]   Culture, sputum-assessment     Status: None   Collection Time: 08/15/18  7:00 AM  Result Value Ref Range Status   Specimen Description SPUTUM  Final   Special Requests NONE  Final   Sputum evaluation   Final    THIS SPECIMEN IS ACCEPTABLE FOR SPUTUM CULTURE Performed at [facility], [address], [place],  [address]    Report Status 08/15/2018 FINAL  Final  Gram stain     Status: None   Collection Time: 08/15/18  7:00 AM  Result Value Ref Range Status   Specimen Description URINE, RANDOM  Final   Special Requests NONE  Final   Gram Stain   Final    NO WBC SEEN NO ORGANISMS SEEN CYTOSPIN SMEAR Performed at [facility], [place] [address], [place],  [address]    Report Status 08/15/2018 FINAL  Final  Culture, respiratory     Status: None   Collection Time: 08/15/18  7:00  AM  Result Value Ref Range Status   Specimen Description SPUTUM  Final   Special Requests NONE Reflexed from [id number]  Final   Gram Stain   Final    MODERATE WBC PRESENT,BOTH PMN AND MONONUCLEAR FEW SQUAMOUS EPITHELIAL CELLS PRESENT FEW GRAM POSITIVE RODS FEW GRAM NEGATIVE RODS FEW GRAM VARIABLE ROD FEW Lonell Grandchild  POSITIVE COCCI IN PAIRS    Culture   Final    CULTURE REINCUBATED FOR BETTER GROWTH Consistent with normal respiratory flora. Performed at [facility], [place] [address] [place], [place] [address]    Report Status 08/17/2018 FINAL  Final         Radiology Studies: No results found.      Scheduled Meds: . apixaban  5 mg Oral BID  . atorvastatin  10 mg Oral q1800  . feeding supplement (ENSURE ENLIVE)  237 mL Oral TID BM  . furosemide  80 mg Intravenous BID  . levothyroxine  25 mcg Oral QAC breakfast  . multivitamin with minerals  1 tablet Oral Daily  . potassium chloride  40 mEq Oral Daily   Continuous Infusions: . sodium chloride 10 mL/hr at 08/18/18 0539  . ceFEPime (MAXIPIME) IV 1 g (08/18/18 1305)     LOS: 3 days     Vernell Leep, MD, FACP, [facility]. [facility] Pager [phone]  If 7PM-7AM, please contact night-coverage [email or web address] Password [email or web address] 08/18/2018, 1:11 PM

## 2018-08-18 NOTE — Progress Notes (Addendum)
Pharmacy Antibiotic Note  Marqui Formby is a 47 y.o. male admitted on 08/14/2018 with pneumonia.  Pharmacy has been consulted for vancomycin dosing and transition to Augmentin today. Pt has good renal fx.  Plan:  PO augmentin 875 BID (x3 days)for a total of 7 days therapy for HCAP Pt has good renal fx.   Height: 5\' 8"  (172.7 cm) Weight: 146 lb 1.6 oz (66.3 kg) IBW/kg (Calculated) : 68.4  Temp (24hrs), Avg:98 F (36.7 C), Min:97.5 F (36.4 C), Max:98.4 F (36.9 C)  Recent Labs  Lab 08/14/18 1717 08/14/18 2055 08/15/18 0434 08/15/18 0652 08/15/18 0934 08/16/18 0600 08/17/18 0446 08/18/18 0404  WBC 10.1  --  6.2 5.8 5.8 4.3  --   --   CREATININE 0.80  --  0.72  --   --  0.73 0.67 0.68  LATICACIDVEN  --  1.66 1.7  --   --   --   --   --     Estimated Creatinine Clearance: 109.3 mL/min (by C-G formula based on SCr of 0.68 mg/dL).    Allergies  Allergen Reactions  . Lyrica [Pregabalin] Anaphylaxis  . Nsaids Other (See Comments)    Antimicrobials this admission:   Thank you for allowing pharmacy to be a part of this patient's care.  Yetta Glassman 08/18/2018 1:53 PM

## 2018-08-18 NOTE — Progress Notes (Addendum)
Advanced Heart Failure Rounding Note  PCP-Cardiologist: No primary care provider on file.   Subjective:     Feeling better today. Continues to have yellow/tan productive cough with scant hemoptysis. Denies overt orthopnea, but states more comfortable sleeping in recliner. No dizziness or lightheadedness. Not ambulating too much. No CP.   ReDS reading 42% (normal 25-35%)  I/Os inaccurate. Weight shows up 3 lbs. Cr and K WNL. Na 128. WBC 4.3 on 10/19.  Objective:   Weight Range: 66.3 kg Body mass index is 22.21 kg/m.   Vital Signs:   Temp:  [97.4 F (36.3 C)-98.4 F (36.9 C)] 98 F (36.7 C) (10/21 0417) Pulse Rate:  [50-82] 80 (10/21 0417) BP: (92-113)/(62-79) 94/73 (10/21 0417) SpO2:  [93 %-100 %] 93 % (10/21 0417) Weight:  [66.3 kg] 66.3 kg (10/21 0419) Last BM Date: 08/14/18  Weight change: Filed Weights   08/16/18 0452 08/17/18 0403 08/18/18 0419  Weight: 65.8 kg 65.2 kg 66.3 kg    Intake/Output:   Intake/Output Summary (Last 24 hours) at 08/18/2018 0741 Last data filed at 08/17/2018 2019 Gross per 24 hour  Intake -  Output 950 ml  Net -950 ml      Physical Exam    General:  NAD  HEENT: Normal. Anicteric  Neck: Supple. JVP ~10 cm+. Carotids 2+ bilat; no bruits. No lymphadenopathy or thyromegaly appreciated. Cor: PMI nondisplaced. Irregularly irregular. No rubs, gallops or murmurs. Lungs: Diminished basilar sounds. No wheeze  Abdomen: Soft, nontender, nondistended. No hepatosplenomegaly. No bruits or masses. Good bowel sounds. Extremities: no cyanosis, clubbing, rash, trace edema Neuro: alert & oriented x 3, cranial nerves grossly intact. moves all 4 extremities w/o difficulty. Affect pleasant   Telemetry   Afib/AFL 70-80s, personally reviewed.   EKG    No new tracings.    Labs    CBC Recent Labs    08/15/18 0934 08/16/18 0600  WBC 5.8 4.3  HGB 12.5* 13.1  HCT 37.9* 38.6*  MCV 85.0 83.2  PLT 472* 578*   Basic Metabolic  Panel Recent Labs    08/17/18 0446 08/18/18 0404  NA 130* 128*  K 3.7 3.9  CL 92* 94*  CO2 26 25  GLUCOSE 82 95  BUN 5* 9  CREATININE 0.67 0.68  CALCIUM 8.5* 8.3*   Liver Function Tests Recent Labs    08/16/18 0600 08/18/18 0404  AST 53* 78*  ALT 19 26  ALKPHOS 247* 238*  BILITOT 4.0* 2.6*  PROT 6.8 7.2  ALBUMIN 2.2* 2.3*   No results for input(s): LIPASE, AMYLASE in the last 72 hours. Cardiac Enzymes No results for input(s): CKTOTAL, CKMB, CKMBINDEX, TROPONINI in the last 72 hours.  BNP: BNP (last 3 results) No results for input(s): BNP in the last 8760 hours.  ProBNP (last 3 results) No results for input(s): PROBNP in the last 8760 hours.   D-Dimer No results for input(s): DDIMER in the last 72 hours. Hemoglobin A1C No results for input(s): HGBA1C in the last 72 hours. Fasting Lipid Panel No results for input(s): CHOL, HDL, LDLCALC, TRIG, CHOLHDL, LDLDIRECT in the last 72 hours. Thyroid Function Tests No results for input(s): TSH, T4TOTAL, T3FREE, THYROIDAB in the last 72 hours.  Invalid input(s): FREET3  Other results:   Imaging     No results found.   Medications:     Scheduled Medications: . apixaban  5 mg Oral BID  . atorvastatin  10 mg Oral q1800  . feeding supplement (ENSURE ENLIVE)  237 mL Oral  TID BM  . furosemide  40 mg Intravenous BID  . levothyroxine  25 mcg Oral QAC breakfast  . multivitamin with minerals  1 tablet Oral Daily  . potassium chloride  40 mEq Oral Daily     Infusions: . sodium chloride 10 mL/hr at 08/18/18 0539  . ceFEPime (MAXIPIME) IV 1 g (08/18/18 0540)     PRN Medications:  sodium chloride, acetaminophen **OR** acetaminophen    Patient Profile   Charles Hawkins is a 47 y.o. male with h/o HTN, restrictive CMP, Afib, h/o bilateral club feet s/p surgical repair, gout, arthritis, h/o CVA on Eliquis, and chronic pain.   Presented 08/14/18 with worsening dyspnea and malaise. Found to be hypotensive with  + PCT. Started on ABX for PNA.   Assessment/Plan   1. PNA - Covering with Vanc/Cefepime for PNA and PCT 0.94. - BCx negative.  - BPs improved to 90-100s.   2. Acute on chronic diastolic HF due to restrictive cardiomyopathy -Followed by [facility] for possible transplant. He has had transplant evaluation and was not listed due to tobacco and THC use. -Last seen 11/2017 by [facility] - Echo 05/19/18 EF 55-60%.  - Volume status remains elevated  - Increase IV lasix to 80 mg BID for today.  - Could consider spiro if BP tolerates.  - Has previously refused coreg. Was supposed to see Dr. Posey Pronto in f/u and discuss.  -Previously on entresto but stopped due to intolerance.  - With previous LV dysfunction, could consider ARB/spiro as tolerated.  - Ultimately, will need to be seen at [facility] for Transplant consideration.  - Son had heart transplant at [place] age 73 months old due to reported infiltrative CM (exact diagnosis not reported in records I have).  - cMRI 5/15 Normal LV size and systolic function, EF 82%. Mild LV hypertrophy. Normal RV size and systolic function,  Moderate to severe biatrial enlargement, Evidence for diffuse infiltrative process by delayed enhancementimaging, possible cardiac amyloidosis.  3. CVA 04/2018 -He underwent emergent CTA 04/2018 found a right M1 MCA occlusion consistent with an embolic stroke.Underwent endovascular revascularization of the right MCA with x 1 pass with 87mm x 89mm embotrap retrieve device achieving a TICI 3reperfusion. - Continue Eliquis and statin.   4. A fibrillation  - Remains in Afib/Flutter this admission.  -Amiodarone previously stopped by his PCP due to low BP and hypothyroidism.  - Has previously been in A flutter and discussed A flutter ablation but did not go to appt.  - Continue eliquis 5 mg BID. No bleeding.  - Rate controlled.   5. Anemia - Hgb up to 13.1 today (11.9 on arrival from 15 several months ago) - Suspect chronic component.  No overt bleeding.  - Iron low, TIBC low, Ferritin high.   6. Seizure like activity - Initially reported "jerking", but family states he had seizure like activity with eyes rolling in the back of his head and myoclonic jerks.  - Reports similar in the past on Gabapentin and ? If related to cymbalta. Per primary.  - Neurology following. EEG normal. Twitches noted during exam, but no epileptiform correlation.  - Plan to follow up with neuro prn if has repeat spells.   Medication concerns reviewed with patient and pharmacy team. Barriers identified: Compliance, Poor follow up.   Length of Stay: 3  Annamaria Helling  08/18/2018, 7:41 AM  Advanced Heart Failure Team Pager [phone] (M-F; 7a - 4p)  Please contact [facility] for night-coverage after hours (4p -7a )  and weekends on [email or web address]  Patient seen and examined with the above-signed Advanced Practice Provider and/or Housestaff. I personally reviewed laboratory data, imaging studies and relevant notes. I independently examined the patient and formulated the important aspects of the plan. I have edited the note to reflect any of my changes or salient points. I have personally discussed the plan with the patient and/or family.  Remains volume overloaded. JVP up. ReDS reading 42% (normal 25-35%). Would continue IV lasix at least one more day. Watch renal function and electrolytes as well. EEG normal per neuro. Remains in AFL. Tolerating Eliquis without bleeding.   Glori Bickers, MD  1:44 PM

## 2018-08-19 LAB — BASIC METABOLIC PANEL
Anion gap: 10 (ref 5–15)
BUN: 10 mg/dL (ref 6–20)
CO2: 29 mmol/L (ref 22–32)
Calcium: 8.7 mg/dL — ABNORMAL LOW (ref 8.9–10.3)
Chloride: 91 mmol/L — ABNORMAL LOW (ref 98–111)
Creatinine, Ser: 0.69 mg/dL (ref 0.61–1.24)
GFR calc Af Amer: >60 mL/min (ref >60)
GFR calc non Af Amer: >60 mL/min (ref >60)
Glucose, Bld: 101 mg/dL — ABNORMAL HIGH (ref 70–99)
Potassium: 3.9 mmol/L (ref 3.5–5.1)
Sodium: 130 mmol/L — ABNORMAL LOW (ref 135–145)

## 2018-08-19 LAB — MAGNESIUM: Magnesium: 1.9 mg/dL (ref 1.7–2.4)

## 2018-08-19 LAB — CULTURE, BLOOD (ROUTINE X 2)
Culture: NO GROWTH
Culture: NO GROWTH
Special Requests: ADEQUATE
Special Requests: ADEQUATE

## 2018-08-19 MED ORDER — FUROSEMIDE 40 MG PO TABS: ORAL_TABLET | ORAL | 0 refills | Status: DC

## 2018-08-19 MED ORDER — AMOXICILLIN-POT CLAVULANATE 875-125 MG PO TABS: 1.0000 | ORAL_TABLET | Freq: Two times a day (BID) | ORAL | 0 refills | Status: AC

## 2018-08-19 MED ORDER — POTASSIUM CHLORIDE ER 10 MEQ PO CPCR: 40.0000 meq | ORAL_CAPSULE | Freq: Every day | ORAL | 0 refills | Status: DC

## 2018-08-19 MED ORDER — ENSURE ENLIVE PO LIQD: 237.0000 mL | Freq: Three times a day (TID) | ORAL | Status: DC

## 2018-08-19 MED ORDER — MAGNESIUM SULFATE 2 GM/50ML IV SOLN
2.0000 g | Freq: Once | INTRAVENOUS | Status: AC
  Administered 2018-08-19: 2 g via INTRAVENOUS
  Filled 2018-08-19: qty 50

## 2018-08-19 NOTE — Progress Notes (Addendum)
Advanced Heart Failure Rounding Note  PCP-Cardiologist: No primary care provider on file.   Subjective:    ReDS reading 42% 10/22 (normal 25-35%). Lasix increased to 80 mg IV BID with modest UOP charted. Weight unchanged.    Creatinine stable 0.69  Denies CP or SOB while ambulating in halls. Sleeps sitting up chronically. Still has cough, but says it is getting dryer. Still has yellow sputum. Afebrile.   Objective:   Weight Range: 66.6 kg Body mass index is 22.34 kg/m.   Vital Signs:   Temp:  [97.6 F (36.4 C)-98.3 F (36.8 C)] 97.9 F (36.6 C) (10/22 0423) Pulse Rate:  [63-88] 65 (10/22 0423) BP: (96-108)/(62-85) 103/62 (10/22 0423) SpO2:  [98 %] 98 % (10/21 1646) Weight:  [66.6 kg] 66.6 kg (10/22 0423) Last BM Date: 08/17/18  Weight change: Filed Weights   08/17/18 0403 08/18/18 0419 08/19/18 0423  Weight: 65.2 kg 66.3 kg 66.6 kg    Intake/Output:   Intake/Output Summary (Last 24 hours) at 08/19/2018 0923 Last data filed at 08/19/2018 0051 Gross per 24 hour  Intake 222 ml  Output 1200 ml  Net -978 ml      Physical Exam    General:  No resp difficulty. HEENT: Normal Neck: Supple. JVP ~10. Carotids 2+ bilat; no bruits. No thyromegaly or nodule noted. Cor: PMI nondisplaced. IRR, No M/G/R noted Lungs: decreased in bases. Abdomen: Soft, non-tender, non-distended, no HSM. No bruits or masses. +BS  Extremities: No cyanosis, clubbing, or rash. R and LLE no edema.  Neuro: Alert & orientedx3, cranial nerves grossly intact. moves all 4 extremities w/o difficulty. Affect pleasant   Telemetry   Aflutter 70s with frequent PVCs (8-20/min), personally reviewed.   EKG    No new tracings.   Labs    CBC No results for input(s): WBC, NEUTROABS, HGB, HCT, MCV, PLT in the last 72 hours. Basic Metabolic Panel Recent Labs    08/18/18 0404 08/19/18 0415  NA 128* 130*  K 3.9 3.9  CL 94* 91*  CO2 25 29  GLUCOSE 95 101*  BUN 9 10  CREATININE 0.68 0.69    CALCIUM 8.3* 8.7*   Liver Function Tests Recent Labs    08/18/18 0404  AST 78*  ALT 26  ALKPHOS 238*  BILITOT 2.6*  PROT 7.2  ALBUMIN 2.3*   No results for input(s): LIPASE, AMYLASE in the last 72 hours. Cardiac Enzymes No results for input(s): CKTOTAL, CKMB, CKMBINDEX, TROPONINI in the last 72 hours.  BNP: BNP (last 3 results) No results for input(s): BNP in the last 8760 hours.  ProBNP (last 3 results) No results for input(s): PROBNP in the last 8760 hours.   D-Dimer No results for input(s): DDIMER in the last 72 hours. Hemoglobin A1C No results for input(s): HGBA1C in the last 72 hours. Fasting Lipid Panel No results for input(s): CHOL, HDL, LDLCALC, TRIG, CHOLHDL, LDLDIRECT in the last 72 hours. Thyroid Function Tests No results for input(s): TSH, T4TOTAL, T3FREE, THYROIDAB in the last 72 hours.  Invalid input(s): FREET3  Other results:   Imaging    No results found.   Medications:     Scheduled Medications: . amoxicillin-clavulanate  1 tablet Oral Q12H  . apixaban  5 mg Oral BID  . atorvastatin  10 mg Oral q1800  . feeding supplement (ENSURE ENLIVE)  237 mL Oral TID BM  . furosemide  80 mg Intravenous BID  . levothyroxine  25 mcg Oral QAC breakfast  . multivitamin with minerals  1 tablet Oral Daily  . potassium chloride  40 mEq Oral Daily    Infusions: . sodium chloride 10 mL/hr at 08/18/18 0539    PRN Medications: sodium chloride, acetaminophen **OR** acetaminophen    Patient Profile   Charles Hawkins is a 47 y.o. male with h/o HTN, restrictive CMP, Afib, h/o bilateral club feet s/p surgical repair, gout, arthritis, h/o CVA on Eliquis, and chronic pain.   Presented 08/14/18 with worsening dyspnea and malaise. Found to be hypotensive with + PCT. Started on ABX for PNA.   Assessment/Plan   1. PNA - Covering with Vanc/Cefepime for PNA and PCT 0.94.  - BCx negative. Afebrile. - BPs 90-100s  2. Acute on chronic diastolic HF due to  restrictive cardiomyopathy -Followed by [facility] for possible transplant. He has had transplant evaluation and was not listed due to tobacco and THC use. -Last seen 11/2017 by [facility] - Echo 05/19/18 EF 55-60%.  - Volume status remains elevated. Weight unchanged.  - Continue IV lasix 80 mg BID, at least this morning. Recheck clip. On lasix 40/20 at home.  - Could consider spiro if BP tolerates.  - Has previously refused coreg. Was supposed to see Dr. Posey Pronto in f/u and discuss.  -Previously on entresto but stopped due to intolerance.  - With previous LV dysfunction, could consider ARB/spiro as tolerated.  - Ultimately, will need to be seen at [facility] for Transplant consideration.  - Son had heart transplant at [place] age 38 months old due to reported infiltrative CM (exact diagnosis not reported in records I have).  - cMRI 5/15 Normal LV size and systolic function, EF 41%. Mild LV hypertrophy. Normal RV size and systolic function,  Moderate to severe biatrial enlargement, Evidence for diffuse infiltrative process by delayed enhancementimaging, possible cardiac amyloidosis.  3. CVA 04/2018 -He underwent emergent CTA 04/2018 found a right M1 MCA occlusion consistent with an embolic stroke.Underwent endovascular revascularization of the right MCA with x 1 pass with 68mm x 6mm embotrap retrieve device achieving a TICI 3reperfusion. - Continue Eliquis and statin. No change.   4. A fibrillation/Flutter - Remains in Afib/Flutter this admission.  -Amiodarone previously stopped by his PCP due to low BP and hypothyroidism.  - Has previously been in A flutter and discussed A flutter ablation but did not go to appt.  - Continue eliquis 5 mg BID. No bleeding.   - Rate controlled.    5. Anemia - Hgb up to 13.1 yesterday. (11.9 on arrival from 15 several months ago) - Suspect chronic component. No overt bleeding - Iron low, TIBC low, Ferritin high. Does not need IV iron. Discussed with PharmD.  6. Seizure  like activity - Initially reported "jerking", but family states he had seizure like activity with eyes rolling in the back of his head and myoclonic jerks.  - Reports similar in the past on Gabapentin and ? If related to cymbalta. Per primary.  - Neurology following. EEG normal. Twitches noted during exam, but no epileptiform correlation.  - Plan to follow up with neuro prn if has repeat spells. NO change.   7. Frequent PVCs - 8-20/min. Previously taken off amio for a fib due to low BP and hypothyroidism. - K 3.9. Will add on mag.   Medication concerns reviewed with patient and pharmacy team. Barriers identified: Compliance, Poor follow up.   Addendum:  Heart failure team will sign off as of 08/19/18  HF Medication Recommendations for Home: Apixiban 5 mg BID Lipitor 10 mg daily Lasix 80  mg am, 40 mg pm  Other recommendations (Labs,testing, etc): BMET at follow up   Follow up as an outpatient: 09/01/18 11 am in HF clinic  Length of Stay: [place], NP  08/19/2018, 9:23 AM  Advanced Heart Failure Team Pager [phone] (M-F; [place])  Please contact [facility] for night-coverage after hours (4p -7a ) and weekends on [email or web address]  Patient seen and examined with the above-signed Advanced Practice Provider and/or Housestaff. I personally reviewed laboratory data, imaging studies and relevant notes. I independently examined the patient and formulated the important aspects of the plan. I have edited the note to reflect any of my changes or salient points. I have personally discussed the plan with the patient and/or family.  Feels much better. Eager to go home. Cough improved but still with some productive sputum. Has diuresed well. ReDS vest 37% (normal 25-35%). Ok to discharge from our perspective. Continue lasix to 80/40. Increase to 80 bid as needed. Refuses carvedilol or entresto. F/u HF Clinic 1-2 weeks. Discussed with Dr. Lavella Lemons.   Glori Bickers, MD  3:05 PM

## 2018-08-19 NOTE — Progress Notes (Signed)
ReDS clip reading completed=37%.

## 2018-08-19 NOTE — Discharge Instructions (Signed)

## 2018-08-19 NOTE — Progress Notes (Signed)
Nutrition Follow-up / Consult  DOCUMENTATION CODES:   Severe malnutrition in context of acute illness/injury  INTERVENTION:    Continue Ensure Enlive po TID, each supplement provides 350 kcal and 20 grams of protein  Low sodium education provided  NUTRITION DIAGNOSIS:   Severe Malnutrition related to acute illness(HCAP) as evidenced by moderate fat depletion, percent weight loss(12% weight loss within 3 months).  Ongoing  GOAL:   Patient will meet greater than or equal to 90% of their needs  Progressing  MONITOR:   PO intake, Supplement acceptance, Labs, I & O's  REASON FOR ASSESSMENT:   Consult Diet education  ASSESSMENT:   47 yo male with PMH of infiltrative cardiomyopathy, HTN, CHF, stroke who was admitted on 10/17 with hypotension, hyponatremia, HCAP.  Provided "Heart Failure Nutrition Therapy for the Undernourished" handout to patient. Discussed ways to decrease sodium intake while also increasing protein and calorie intake to prevent further depletion of muscle and fat mass. Patient is trying to limit intake of beverages and sodium because he knows he is still volume overloaded. Encouraged patient to eat small, frequent meals / snacks, including Ensure Enlive supplements. Patient appreciative of RD visit and diet education.  Intake of meals is improving, patient is consuming 85-100% of most meals, although he did not eat lunch yesterday.   Labs and medications reviewed.   Diet Order:   Diet Order            Diet Heart Room service appropriate? Yes; Fluid consistency: Thin; Fluid restriction: 1200 mL Fluid  Diet effective now              EDUCATION NEEDS:   Education needs have been addressed  Skin:  Skin Assessment: Reviewed RN Assessment  Last BM:  10/22  Height:   Ht Readings from Last 1 Encounters:  08/14/18 5\' 8"  (1.727 m)    Weight:   Wt Readings from Last 1 Encounters:  08/19/18 66.6 kg    Ideal Body Weight:  70 kg  BMI:  Body  mass index is 22.34 kg/m.  Estimated Nutritional Needs:   Kcal:  2000-2200  Protein:  90-110 gm  Fluid:  2-2.2 L    Molli Barrows, RD, LDN, [place] Pager [phone] After Hours Pager [phone]

## 2018-08-19 NOTE — Discharge Summary (Signed)
Physician Discharge Summary  [facility] TIW:[id number] DOB: 15-Dec-1970  PCP: Benito Mccreedy, MD  Admit date: 08/14/2018 Discharge date: 08/19/2018  Recommendations for Outpatient Follow-up:  1. Dr. Benito Mccreedy, PCP in 1 week with repeat labs (CBC & CMP). 2. Advanced Heart Failure clinic on 09/01/2018 at 11 AM. 3. Recommend follow-up chest x-ray in 4 weeks to insure resolution of pneumonia findings. 4. Consider repeating TSH in 2 to 4 weeks and adjust Synthroid dose as needed.  Home Health: None Equipment/Devices: None  Discharge Condition: Improved and stable CODE STATUS: Full Diet recommendation: Heart healthy diet.  Discharge Diagnoses:  Principal Problem:   Hypotension Active Problems:   Acute on chronic diastolic CHF (congestive heart failure) (HCC)   Restrictive cardiomyopathy (HCC)   Hyponatremia   HCAP (healthcare-associated pneumonia)   Community acquired pneumonia   Protein-calorie malnutrition, severe   Brief Summary: 47 year old married male, lives with spouse and 3 kids, ambulates at times with the help of a cane when he has lower extremity joint pains, does not work, PMH of HTN, restrictive CMP (followed by Dr. Aundra Dubin and at [facility] for possible transplant), A. fib on Eliquis, bilateral clubfeet status post surgical repair, gout, arthritis, recent CVA and chronic pain, was recently started on Cymbalta approximately 4 weeks ago by his PCP for lower extremity pains, presented to [facility] ED on 08/14/2018 due to approximately 1 month history of intermittent dizziness, weakness, long-standing history of cough which is now productive of yellow/green sputum, low-grade fevers, chills, decreased appetite, mood swings, chronic orthopnea (usually sleeps in chair at night) and a brief episode of involuntary jerking and eyes rolling while asleep 3 days PTA that was noted by his spouse.  In ED, noted to be hypotensive in the 70s-80s, improved with IV fluids and admitted for  suspected pneumonia.  Cardiology consulted.  For suspicion of seizure-like activity, Neurology consulted.  Assessment & Plan:   Hypotension Present on admission.  Suspect due to dehydration from poor oral intake.  Clinically not septic.  Lactate normal.  Improved after IV fluids.   Suspected pneumonia/HCAP:  Blood cultures x2 negative.  MRSA PCR negative.  Sputum culture consistent with normal respiratory flora.  Empirically started on IV cefepime and vancomycin. PCT 0.94.  Discontinued vancomycin.  Completed 4 days IV antibiotics.  Transitioned to oral Augmentin to complete total 7 days course.  Recommend follow-up chest x-ray in 4 weeks.  Hyponatremia: DD: Dehydration, Cymbalta.  He stopped taking Cymbalta 4 to 5 days PTA.  Hydrated with normal saline on admission, now off.  Asymptomatic.    Sodium stable in the 130 range.  Cymbalta discontinued.  Normocytic anemia Stable.?  Chronic disease.  No overt bleeding reported.  Outpatient follow-up.  Acute on chronic diastolic CHF:  Care directed by cardiology, input appreciated.  TTE 7/19: LVEF 55-60%.  Clinically not volume overloaded except elevated JVD.  Cardiology initiated Lasix 40 mg IV twice daily and then increased to 80 mg twice daily. Suspect intake output recording is inaccurate.  Seen by cardiology on day of discharge and cleared for discharge home on increased dose of Lasix, 80 mg in a.m. and 40 mg in p.m.  They have arranged close outpatient follow-up in heart failure clinic. ReDs vest 37% (normal 25-35%).  Patient apparently refuses Carvedilol or Entresto.  Improved and stable.  Atypical atrial flutter versus coarse A. fib, chronic Management per cardiology.  Rate controlled without medications.  Continue Eliquis.  Patient had CVA 7/19 in setting of missing at least a dose of Eliquis.  Frequent PVCs As per cardiology, previously taken off amiodarone for A. fib due to hypotension and hypothyroidism.  Potassium 3.9.  Received a  dose of IV magnesium.  Elevated troponin Minimal, suspecting demand ischemia.  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  Marijuana use Reports that he was trying this to improve appetite.  Cessation counseled.  ?  Seizure-like activity Reportedly occurred 4 to 5 days PTA while he was asleep lasting ~ 8 secs and noted by his spouse.  Reported similar occurrence when on gabapentin.?  Mother with history of seizures.  Seizure precautions and monitor for seizures.?  Related to Cymbalta-now stopped by patient. Neurology consulted and as per history provided to them, the spells/hypnic jerks occurred while awake and suspect Cymbalta side effect rather than seizures.  They recommend no Cymbalta.  EEG negative for seizures, no indication for AEDs, neurology signed off.  CVA 04/2018 No residual deficits.  Continue Eliquis and statins.  Hyperbilirubinemia Unclear etiology.  Obtain split bilirubin/almost even.  Follow LFTs closely, improving.?  Passive hepatic congestion from CHF.  No GI symptoms reported.  Total bilirubin down from 5.3 on admission to 2.6.  Severe malnutrition in context of acute illness/injury Management per RD input on 10/18.  Hypothyroid Clinically euthyroid.  TSH however elevated 6.959 on 08/15/2018.  Consider repeating TSH in 2 to 4 weeks.  Continue current dose of Synthroid.  Low blood glucose:  Patient had couple of low sugars on BMP/CBG which were asymptomatic.  Unclear etiology.  Could be due to poor oral intake.  Did not have any further episodes since 08/16/2018.    Consultants:  Cardiology  Procedures:  EEG 08/16/2018:  Impression: This is a normal EEG in the awake and drowsy states without any epileptiform abnormalities noted. Some twitches (hands and feet) were noted, but no epileptiform correlate for the  events.  A single normal EEG does not exclude the diagnosis of epilepsy.  Clinical correlation advised.    Discharge Instructions  Discharge Instructions    (HEART FAILURE PATIENTS) Call MD:  Anytime you have any of the following symptoms: 1) 3 pound weight gain in 24 hours or 5 pounds in 1 week 2) shortness of breath, with or without a dry hacking cough 3) swelling in the hands, feet or stomach 4) if you have to sleep on extra pillows at night in order to breathe.   Complete by:  As directed    Call MD for:   Complete by:  As directed    Recurrent seizure-like activity.   Call MD for:  difficulty breathing, headache or visual disturbances   Complete by:  As directed    Call MD for:  extreme fatigue   Complete by:  As directed    Call MD for:  persistant dizziness or light-headedness   Complete by:  As directed    Call MD for:  temperature >100.4   Complete by:  As directed    Diet - low sodium heart healthy   Complete by:  As directed    Increase activity slowly   Complete by:  As directed        Medication List    STOP taking these medications   buPROPion 150 MG 12 hr tablet Commonly known as:  WELLBUTRIN SR   colchicine 0.6 MG tablet   DULoxetine 30 MG capsule Commonly known as:  CYMBALTA  TAKE these medications   amoxicillin-clavulanate 875-125 MG tablet Commonly known as:  AUGMENTIN Take 1 tablet by mouth 2 (two) times daily for 3 days.   atorvastatin 10 MG tablet Commonly known as:  LIPITOR Take 1 tablet (10 mg total) by mouth daily at 6 PM.   ELIQUIS 5 MG Tabs tablet Generic drug:  apixaban TAKE 1 TABLET (5 MG TOTAL) BY MOUTH 2 (TWO) TIMES DAILY. What changed:  See the new instructions.   feeding supplement (ENSURE ENLIVE) Liqd Take 237 mLs by mouth 3 (three) times daily between meals.   furosemide 40 MG tablet Commonly known as:  LASIX Take 80 mg (2 tablets) in the morning & take 40 mg ( 1 tablet) in the evening What changed:  additional  instructions   levothyroxine 25 MCG tablet Commonly known as:  SYNTHROID, LEVOTHROID Take 25 mcg by mouth daily before breakfast.   multivitamin with minerals Tabs tablet Take 1 tablet by mouth daily.   potassium chloride 10 MEQ CR capsule Commonly known as:  MICRO-K Take 4 capsules (40 mEq total) by mouth daily. What changed:    how much to take  how to take this  when to take this  additional instructions      Follow-up Information    [place]. Go on 09/01/2018.   Specialty:  Cardiology Why:  at 11:00 AM in the [facility].  Please bring all medications to appt.  Gate code is 1800.  Contact information: [address] [id number] [facility]       Benito Mccreedy, MD. Schedule an appointment as soon as possible for a visit in 1 week(s).   Specialty:  Internal Medicine Why:  To be seen with repeat labs (CBC & CMP). Contact information: [address] [place] [address] [phone]        Larey Dresser, MD .   Specialty:  Cardiology Contact information: [address] [place] [address] [phone]          Allergies  Allergen Reactions  . Lyrica [Pregabalin] Anaphylaxis  . Nsaids Other (See Comments)      Procedures/Studies: Dg Chest 2 View  Result Date: 08/14/2018 CLINICAL DATA:  Dizziness and sleepiness. EXAM: CHEST - 2 VIEW COMPARISON:  05/20/2018 FINDINGS: The cardiac silhouette is enlarged. Mediastinal contours appear intact. Bilateral small pleural effusions. Worsening of right middle and bilateral lower lobe airspace consolidation. Osseous structures are without acute abnormality. Soft tissues are grossly normal. IMPRESSION: Worsening airspace consolidation in the right middle lobe and bilateral lower lobes. Bilateral small pleural effusions. Enlarged cardiac silhouette. Electronically Signed   By: Fidela Salisbury M.D.   On: 08/14/2018 17:50      Subjective: Anxious to go home.  Denies complaints.  Today reports that he usually sleeps on a chair chronically without dyspnea.  Ambulated halls without dyspnea or chest pain.  Cough improved.  Mildly productive of tan sputum.  No chest pain, dizziness or lightheadedness.  Discharge Exam:  Vitals:   08/18/18 1646 08/18/18 1934 08/19/18 0423 08/19/18 1457  BP: 96/69 96/64 103/62 92/65  Pulse: 88 63 65 71  Resp:      Temp: 98.3 F (36.8 C) 97.6 F (36.4 C) 97.9 F (36.6 C) 97.6 F (36.4 C)  TempSrc: Oral Oral Oral Oral  SpO2: 98%   100%  Weight:   66.6 kg   Height:  General exam: Pleasant young male, moderately built and thinly nourished sitting up comfortably in reclining chair. Respiratory system: Very occasional basal crackles.  Otherwise clear to auscultation.  No increased work of breathing. Cardiovascular system: S1 & S2 heard, RRR. No murmurs, rubs, gallops or clicks. No pedal edema. No JVD.  Telemetry personally reviewed: A. fib with controlled ventricular rate.  PVCs. Gastrointestinal system: Abdomen is nondistended, soft and nontender. No organomegaly or masses felt. Normal bowel sounds heard.   Central nervous system: Alert and oriented. No focal neurological deficits.   Extremities: Symmetric 5 x 5 power. Skin: No rashes, lesions or ulcers Psychiatry: Judgement and insight appear normal. Mood & affect appropriate.     The results of significant diagnostics from this hospitalization (including imaging, microbiology, ancillary and laboratory) are listed below for reference.     Microbiology: Recent Results (from the past 240 hour(s))  Blood Culture (routine x 2)     Status: None   Collection Time: 08/14/18  8:38 PM  Result Value Ref Range Status   Specimen Description BLOOD LEFT ANTECUBITAL  Final   Special Requests   Final    BOTTLES DRAWN AEROBIC AND ANAEROBIC Blood Culture adequate volume   Culture   Final     NO GROWTH 5 DAYS Performed at [facility], [address], [place], [place] [address]    Report Status 08/19/2018 FINAL  Final  Blood Culture (routine x 2)     Status: None   Collection Time: 08/14/18  8:40 PM  Result Value Ref Range Status   Specimen Description BLOOD RIGHT ANTECUBITAL  Final   Special Requests   Final    BOTTLES DRAWN AEROBIC AND ANAEROBIC Blood Culture adequate volume   Culture   Final    NO GROWTH 5 DAYS Performed at [facility], [place] [address], [place], [place] [address]    Report Status 08/19/2018 FINAL  Final  MRSA PCR Screening     Status: None   Collection Time: 08/14/18 11:36 PM  Result Value Ref Range Status   MRSA by PCR NEGATIVE NEGATIVE Final    Comment:        The GeneXpert MRSA Assay (FDA approved for NASAL specimens only), is one component of a comprehensive MRSA colonization surveillance program. It is not intended to diagnose MRSA infection nor to guide or monitor treatment for MRSA infections. Performed at [facility], [place] [address], [place], [place] [address]   Culture, sputum-assessment     Status: None   Collection Time: 08/15/18  7:00 AM  Result Value Ref Range Status   Specimen Description SPUTUM  Final   Special Requests NONE  Final   Sputum evaluation   Final    THIS SPECIMEN IS ACCEPTABLE FOR SPUTUM CULTURE Performed at [facility], [address], [place], [place] [address]    Report Status 08/15/2018 FINAL  Final  Gram stain     Status: None   Collection Time: 08/15/18  7:00 AM  Result Value Ref Range Status   Specimen Description URINE, RANDOM  Final   Special Requests NONE  Final   Gram Stain   Final    NO WBC SEEN NO ORGANISMS SEEN CYTOSPIN SMEAR Performed at [facility], [place] [address] [place], [place] [address]    Report Status 08/15/2018 FINAL  Final  Culture, respiratory     Status: None   Collection Time: 08/15/18  7:00 AM  Result Value Ref Range Status  Specimen  Description SPUTUM  Final   Special Requests NONE Reflexed from [id number]  Final   Gram Stain   Final    MODERATE WBC PRESENT,BOTH PMN AND MONONUCLEAR FEW SQUAMOUS EPITHELIAL CELLS PRESENT FEW GRAM POSITIVE RODS FEW GRAM NEGATIVE RODS FEW GRAM VARIABLE ROD FEW GRAM POSITIVE COCCI IN PAIRS    Culture   Final    CULTURE REINCUBATED FOR BETTER GROWTH Consistent with normal respiratory flora. Performed at [facility], [place] [address] [place], [place] [address]    Report Status 08/17/2018 FINAL  Final     Labs: CBC: Recent Labs  Lab 08/14/18 1717 08/15/18 0434 08/15/18 0652 08/15/18 0934 08/16/18 0600  WBC 10.1 6.2 5.8 5.8 4.3  NEUTROABS  --  4.0  --   --   --   HGB 12.9* 12.8* 11.9* 12.5* 13.1  HCT 38.7* 37.3* 36.3* 37.9* 38.6*  MCV 84.3 84.2 84.6 85.0 83.2  PLT 516* 447* 489* 472* 660*   Basic Metabolic Panel: Recent Labs  Lab 08/15/18 0434 08/16/18 0600 08/17/18 0446 08/18/18 0404 08/19/18 0415  NA 127* 130* 130* 128* 130*  K 3.8 3.7 3.7 3.9 3.9  CL 93* 92* 92* 94* 91*  CO2 20* 24 26 25 29   GLUCOSE 56* 90 82 95 101*  BUN 6 7 5* 9 10  CREATININE 0.72 0.73 0.67 0.68 0.69  CALCIUM 8.1* 8.2* 8.5* 8.3* 8.7*  MG 1.8  --   --   --  1.9   Liver Function Tests: Recent Labs  Lab 08/15/18 0434 08/15/18 1500 08/16/18 0600 08/18/18 0404  AST 44* 43* 53* 78*  ALT 19 19 19 26   ALKPHOS 232* 232* 247* 238*  BILITOT 5.3* 5.0* 4.0* 2.6*  PROT 6.7 6.7 6.8 7.2  ALBUMIN 2.3* 2.3* 2.2* 2.3*   Cardiac Enzymes: Recent Labs  Lab 08/15/18 0434  TROPONINI 0.06*   CBG: Recent Labs  Lab 08/17/18 2138 08/18/18 0741 08/18/18 1133 08/18/18 1644 08/18/18 2102  GLUCAP 95 94 99 91 129*   Urinalysis    Component Value Date/Time   COLORURINE AMBER (A) 08/15/2018 0700   APPEARANCEUR HAZY (A) 08/15/2018 0700   LABSPEC 1.026 08/15/2018 0700   PHURINE 5.0 08/15/2018 0700   GLUCOSEU NEGATIVE 08/15/2018 0700   HGBUR NEGATIVE 08/15/2018 0700   BILIRUBINUR SMALL (A)  08/15/2018 0700   KETONESUR 5 (A) 08/15/2018 0700   PROTEINUR NEGATIVE 08/15/2018 0700   NITRITE NEGATIVE 08/15/2018 0700   LEUKOCYTESUR NEGATIVE 08/15/2018 0700      Time coordinating discharge: 40 minutes  SIGNED:  Vernell Leep, MD, FACP, [facility]. [facility] Pager [phone]  If 7PM-7AM, please contact night-coverage [email or web address] Password [email or web address] 08/19/2018, 4:01 PM

## 2018-08-26 ENCOUNTER — Other Ambulatory Visit (HOSPITAL_COMMUNITY): Payer: Self-pay

## 2018-08-26 NOTE — Progress Notes (Signed)
Paramedicine Encounter    Patient ID: Charles Hawkins, male    DOB: November 07, 1970, 47 y.o.   MRN: [id number]   Patient Care Team: Benito Mccreedy, MD as PCP - General (Internal Medicine) Larey Dresser, MD as PCP - Advanced Heart Failure (Cardiology)  Patient Active Problem List   Diagnosis Date Noted  . Hyponatremia 08/15/2018  . HCAP (healthcare-associated pneumonia) 08/15/2018  . Protein-calorie malnutrition, severe 08/15/2018  . Community acquired pneumonia   . Hypotension 08/14/2018  . HTN (hypertension) 05/20/2018  . Hyperlipemia 05/20/2018  . Tobacco use 05/20/2018  . Family hx-stroke 05/20/2018  . CHF (congestive heart failure) ([place]) 05/20/2018  . Restrictive cardiomyopathy ([place])   . 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 Atrial flutter ([place]) 12/06/2014  . Acute on chronic diastolic CHF (congestive heart failure) ([place]) 03/03/2014  . Palpitations 03/03/2014  . Acute gout 02/06/2014  . 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  atorvastatin (LIPITOR) 10 MG tablet, Take 1 tablet (10 mg total) by mouth daily at 6 PM., Disp: 30 tablet, Rfl: 2 .  ELIQUIS 5 MG TABS tablet, TAKE 1 TABLET (5 MG TOTAL) BY MOUTH 2 (TWO) TIMES DAILY. (Patient taking differently: Take 5 mg by mouth 2 (two) times daily. ), Disp: 60 tablet, Rfl: 5 .  feeding supplement, ENSURE ENLIVE, (ENSURE ENLIVE) LIQD, Take 237 mLs by mouth 3 (three) times daily between meals., Disp: , Rfl:  .  furosemide (LASIX) 40 MG tablet, Take 80 mg (2 tablets) in the morning & take 40 mg ( 1 tablet) in the evening, Disp: 90 tablet, Rfl: 0 .  levothyroxine (SYNTHROID, LEVOTHROID) 25 MCG tablet, Take 25 mcg by mouth daily before breakfast. , Disp: , Rfl: 0 .  Multiple Vitamin (MULTIVITAMIN WITH MINERALS) TABS tablet, Take 1 tablet by mouth daily. , Disp: , Rfl:  .  potassium chloride (MICRO-K) 10 MEQ CR capsule, Take 4 capsules (40 mEq total) by mouth  daily., Disp: 120 capsule, Rfl: 0 Allergies  Allergen Reactions  . Lyrica [Pregabalin] Anaphylaxis  . Nsaids Other (See Comments)      Social History   Socioeconomic History  . Marital status: Single    Spouse name: Not on file  . Number of children: Not on file  . Years of education: Not on file  . Highest education level: Not on file  Occupational History  . Not on file  Social Needs  . Financial resource strain: Not on file  . Food insecurity:    Worry: Not on file    Inability: Not on file  . Transportation needs:    Medical: Not on file    Non-medical: Not on file  Tobacco Use  . Smoking status: Former Smoker    Packs/day: 0.25    Types: Cigarettes  . Smokeless tobacco: Never Used  Substance and Sexual Activity  . Alcohol use: Yes    Alcohol/week: 1.0 standard drinks    Types: 1 Cans of beer per week    Comment: occasionally  . Drug use: Yes    Types: Marijuana    Comment: marijuana occasionally  . Sexual activity: Not on file  Lifestyle  . Physical activity:    Days per week: Not on file    Minutes per session: Not on file  . Stress: Not on file  Relationships  . Social connections:    Talks on phone: Not on file    Gets together: Not on file  Attends religious service: Not on file    Active member of club or organization: Not on file    Attends meetings of clubs or organizations: Not on file    Relationship status: Not on file  . Intimate partner violence:    Fear of current or ex partner: Not on file    Emotionally abused: Not on file    Physically abused: Not on file    Forced sexual activity: Not on file  Other Topics Concern  . Not on file  Social History Narrative  . Not on file    Physical Exam      Future Appointments  Date Time Provider [place]  09/01/2018 11:00 AM [facility] PA/NP [facility] None  10/15/2018 10:45 AM Venancio Poisson, NP [facility] None    BP 94/60   Pulse 64   Resp 14   SpO2 98%   Last visit  weight-146 @ d/c   First meeting with pt in the home, pt has 3 kids in the home with wife.  Pt is able to drive himself to appointments.  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 --he reports his medicaid stopped when he got medicare and disability.  He gets meds from [facility] on [address]/[place].  Co-pays are $1-2 a piece-the eliquis is $8.  Needs assistance for recipe ideas/working on limiting his sodium and fluid intake for the day. Reviewed with him options for using can food or less lean meat. Changing diet is a struggle. He does not use table salt anymore and uses ms dash for flavor.  His scale is not working, will get him another scale.  He is having trouble with obtaining ensure due to the costs. He reports he was prescribed it for weight gain.  Pt states his coughing is improved, dry cough at times. He completed the amoxicillin for 3 days after d/c.  Pt states his breathing is normal, no sob upon doing activities around the house.  Offered the mens group meeting for him for resource and I will see him again.  Pt denies any dizziness, no sob, no c/p.  He does have slight swelling to his left foot/ankle.  aenta covering meds--he needs to contact case worker for further medicaid assistance    Marylouise Stacks, [place] [facility]  08/26/18

## 2018-08-26 NOTE — Progress Notes (Signed)
Patient ID: Charles Hawkins, male   DOB: May 20, 1971, 47 y.o.   MRN: [id number]  ADVANCED HF CLINIC CONSULT NOTE  HF: Dr Haroldine Laws   HPI: Charles Hawkins is a 47 y.o. male with h/o HTN, restrictive CMP, Afib, h/o bilateral club feet s/p surgical repair, gout, arthritis, h/o CVA on Eliquis, and chronic pain.   Son had heart transplant at 18 age 21 months old due to reported infiltrative CM (exact diagnosis not reported in records I have). Apparently after that, Charles Hawkins underwent cardiac workup and cMRI suggested infiltrative CM with normal EF. Has had exhaustive w/u since that time under the guidance of Dr. Loyal Buba at [facility] without clear diagnosis. 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 Wife got job in [employer] with Charles Hawkins and [place]. He denies FHx of HF except for his son  Saw Dr. Amalia Hailey in hematology and Charles Hawkins in Rheumatology and he was apparently told he did not have a systemic problem at that time.   Seen for Acute CVA in 05/19/18. He had only missed one dose of Eliquis as outpatient. Had mild left facial droop as only persistent deficit. Was supposed to follow up with Dr Posey Pronto at [facility] in the transplant center, but has moved to [facility] so never did.   Admitted 10/17-10/22/19 with pneumonia and hypotension. He was treated with IV abx, then transitioned to Augmentin. HF team consulted for volume overload. He was diuresed with IV lasix, then transitioned to lasix 80 mg am, 40 mg pm. DC weight: 146 lbs.  He returns today for post hospital follow up. Overall doing okay. Denies SOB except with stairs. Activity mainly limited by joint pain. Denies orthopnea or PND. Had a cough with occasional yellow sputum. No fever or chills. Finished Augmentin 10/25. No CP or dizziness. No palpitations. No bleeding on Eliquis. Weights have been 143 lbs at home since DC. No missed medications. Limits fluid and salt intake. Never eats out. Continues to smoke marijuana regularly. No  tobacco use.  Studies:  - cMRI - LVEF 63% moderate concentric LVH 1.6 cm. RV normal. Biatrial enlargement, Diffuse infiltrative process involving both ventricles - EM biopsy 11/14 - negative - Fat pad biopsy - neagtive - Serologies all normal - Had ETT in 9/14 (no CPX). 7.6 METs HR 76-156 - Event monitor 5/15; SR with PVCs - Repeat cMRI in 5/15      1. Normal LV size and systolic function, EF 95%. Mild LV     hypertrophy. Normal RV size and systolic function.    2. Moderate to severe biatrial enlargement.    3. Evidence for diffuse infiltrative process by delayed enhancement     imaging, possible cardiac amyloidosis. - Had CPX 4/15 which showed significant cardiac limitation  Resting HR: 75 Peak HR: 130 (72% age predicted max HR) BP rest: 102/58 BP peak: 128/62 Peak VO2: 13.9 (38.8% predicted peak VO2) VE/VCO2 slope: 41.4 OUES: 1.09 Peak RER: 1.13 Ventilatory Threshold: 10.2 (28.5% predicted peak VO2) Peak RR 32 Peak Ventilation: 46.3 VE/MVV: 32.2% PETCO2 at peak: 26 O2pulse: 8 (57% predicted O2pulse) - Echo (2/16) with EF 35-40%, thick LV and RV with starry night pattern, moderately hypokinetic RV, severe biatrial enlargement.   Saw Dr. Posey Pronto at [facility] on 06/04/14. They suggested stopping smoking and starting process for OHTx work-up. Returns to [facility] later this month.   Event monitor was done recently, showing multiple runs of atrial tach/flutter 150-160s. He was started on amiodarone and apixaban.  Labs (  2/16): K 4.2, creatinine 0.89, AST 56, ALT 28, tbili 2.1, HCT 41.1 Labs (5/16): K 3.7, creatinine 0.93, TSH 8.357, BNP 2282 => 1242, AST 52 => 62, ALT 26 => 41, tbili 2.3, HCT 42.8 Labs (04/13/15): K 4.0, Creatinine 1.05  Review of systems complete and found to be negative unless listed in HPI.   Current Outpatient Medications on File Prior to Encounter  Medication Sig Dispense Refill  . atorvastatin (LIPITOR) 10 MG tablet Take 1 tablet (10 mg total) by  mouth daily at 6 PM. 30 tablet 2  . ELIQUIS 5 MG TABS tablet TAKE 1 TABLET (5 MG TOTAL) BY MOUTH 2 (TWO) TIMES DAILY. 60 tablet 5  . feeding supplement, ENSURE ENLIVE, (ENSURE ENLIVE) LIQD Take 237 mLs by mouth 3 (three) times daily between meals.    . furosemide (LASIX) 40 MG tablet Take 80 mg (2 tablets) in the morning & take 40 mg ( 1 tablet) in the evening 90 tablet 0  . levothyroxine (SYNTHROID, LEVOTHROID) 25 MCG tablet Take 25 mcg by mouth daily before breakfast.   0  . Multiple Vitamin (MULTIVITAMIN WITH MINERALS) TABS tablet Take 1 tablet by mouth daily.     . potassium chloride (MICRO-K) 10 MEQ CR capsule Take 4 capsules (40 mEq total) by mouth daily. 120 capsule 0   No current facility-administered medications on file prior to encounter.     PHYSICAL EXAM: Vitals:   09/01/18 1105  BP: 106/82  Pulse: 72  SpO2: 98%  Weight: 67.9 kg (149 lb 9.6 oz)   Wt Readings from Last 3 Encounters:  09/01/18 67.9 kg (149 lb 9.6 oz)  08/19/18 66.6 kg (146 lb 14.4 oz)  07/10/18 69.9 kg (154 lb)   General: Well appearing. No resp difficulty. HEENT: Normal Neck: Supple. JVP ~8. Carotids 2+ bilat; no bruits. No thyromegaly or nodule noted. Cor: PMI nondisplaced. IRR, +S4 Lungs: CTAB, normal effort. Abdomen: Soft, non-tender, non-distended, no HSM. No bruits or masses. +BS  Extremities: No cyanosis, clubbing, or rash. R and LLE no edema.  Neuro: Alert & orientedx3, cranial nerves grossly intact. moves all 4 extremities w/o difficulty. Affect pleasant  EKG: Afib 76 bpm with PVCs. Personally reviewed.   ASSESSMENT & PLAN:  1. Chronic diastolic HF due to restrictive cardiomyopathy -Followed by [facility] for possible transplant. He has had transplant evaluation and was not listed due to tobacco and THC use. -Last seen 11/2017 by [facility] - Echo 05/19/18 EF 55-60%. - NYHA II - Volume status okay on exam. ReDS clip 32%  - Continue lasix 80 mg am, 40 mg pm. Okay to take extra 40 mg PRN. BMET  today.   - Add spiro 12.5 mg qHS. Discussed side effects to watch for.  - Has previously refused coreg. Was supposed to see Dr. Posey Pronto in f/u and discuss. -Previously onentrestobut stoppeddue to intolerance. - With previous LV dysfunction, could consider ARB/spiro as tolerated.He refuses.  -Son had heart transplant at 49 age 23 months old due to reported infiltrative CM (exact diagnosis not reported in records I have).  -cMRI 5/15Normal LV size and systolic function, EF 37%. Mild LV hypertrophy. Normal RV size and systolic function,moderate to severe biatrial enlargement,Evidence for diffuse infiltrative process by delayed enhancementimaging, possible cardiac amyloidosis. - Encouraged him to follow up with Dr Posey Pronto for transplant evaluation.  - Continue HF paramedicine.   2.CVA7/2019 -He underwent emergent CTA7/2060found a right M1 MCA occlusion consistent with an embolic stroke.Underwentendovascular revascularization of the rightMCAwith x 1 pass with 70mm x  37mm embotrap retrieve device achieving a TICI 3reperfusion. - Continue Eliquis and statin.No change.   3. A fibrillation/Flutter - Remains in Afib/Flutter this admission. -Amiodaronepreviouslystopped by his PCP due to low BP and hypothyroidism. - Has previously been inA flutterand discussedA flutter ablationbut did not go to appt. - Continue eliquis 5 mg BID. No bleeding.   - Rate controlled.  No change.  4. Anemia - Hgb up to 13.1 on 10/19. Iron panel done. Does not qualify for IV iron.  - Suspect chronic component. Denies bleeding.   5. Seizure like activity inpatient - He had reported "jerking", but family states he had seizure like activity with eyes rolling in the back of his head and myoclonic jerks.  - Reports similar in the past on Gabapentin and ? If related to cymbalta. Per PCP - Neurology following. EEG normal. Twitches noted during exam, but no epileptiform correlation.  - Plan to  follow up with neuro prn if has repeat spells. No change.  6. Frequent PVCs - Previously taken off amio for afib due to low BP and hypothyroidism. No change.   BMET today. On 40 meq K daily. May need to cut back, but will see what labs look like today. Start spiro 12.5 mg qHS BMET 7-10 days Follow up with Dr Posey Pronto (provided phone number) Follow up in 4 weeks  Georgiana Shore 09/01/2018  Greater than 50% of the 25 minute visit was spent in counseling/coordination of care regarding disease state education, salt/fluid restriction, sliding scale diuretics, and medication compliance.

## 2018-08-27 ENCOUNTER — Telehealth (HOSPITAL_COMMUNITY): Payer: Self-pay | Admitting: Licensed Clinical Social Worker

## 2018-08-27 NOTE — Telephone Encounter (Signed)
Patient discussed during paramedicine meeting this morning- patient with severe malnutrition and was prescribed ensure following last admission but unable to afford it.  Pt has Medicare and Medicaid but unsure if insurance will cover any part of this- per website medicare will not cover unless the product is needed for 100% of their nutritional needs and is through tube feeing but requirements for medicaid coverage are unsure.  CSW called pathways number and requested a benefits check for patient- information provided to pathways to complete check they will just need verbal consent from patient- number provided to patient who plans to call them immediately.  CSW will continue to follow through paramedicine program and assist as needed  Jorge Ny, LCSW Clinical Social Worker [facility] [phone]

## 2018-08-28 ENCOUNTER — Telehealth (HOSPITAL_COMMUNITY): Payer: Self-pay | Admitting: Licensed Clinical Social Worker

## 2018-08-28 DIAGNOSIS — Z8673 Personal history of transient ischemic attack (TIA), and cerebral infarction without residual deficits: Secondary | ICD-10-CM | POA: Diagnosis not present

## 2018-08-28 DIAGNOSIS — G629 Polyneuropathy, unspecified: Secondary | ICD-10-CM | POA: Diagnosis not present

## 2018-08-28 DIAGNOSIS — I425 Other restrictive cardiomyopathy: Secondary | ICD-10-CM | POA: Diagnosis not present

## 2018-08-28 DIAGNOSIS — K219 Gastro-esophageal reflux disease without esophagitis: Secondary | ICD-10-CM | POA: Diagnosis not present

## 2018-08-28 DIAGNOSIS — I5020 Unspecified systolic (congestive) heart failure: Secondary | ICD-10-CM | POA: Diagnosis not present

## 2018-08-28 DIAGNOSIS — I429 Cardiomyopathy, unspecified: Secondary | ICD-10-CM | POA: Diagnosis not present

## 2018-08-28 DIAGNOSIS — I10 Essential (primary) hypertension: Secondary | ICD-10-CM | POA: Diagnosis not present

## 2018-08-28 DIAGNOSIS — Z720 Tobacco use: Secondary | ICD-10-CM | POA: Diagnosis not present

## 2018-08-28 DIAGNOSIS — M109 Gout, unspecified: Secondary | ICD-10-CM | POA: Diagnosis not present

## 2018-08-28 NOTE — Telephone Encounter (Signed)
CSW spoke with patient regarding Ensure insurance verification check- pt called but was unable to complete verbal verification with [employer] yesterday.  CSW called [employer] who confirmed pt should be able to complete verbal consent and representative, Janett Billow, encouraged patient to call and request to speak directly to her- CSW provided pt with that information  CSW will continue to follow- [employer] should fax verification of benefits in 2 business days following completion of application.  Jorge Ny, LCSW Clinical Social Worker [facility] [phone]

## 2018-09-01 ENCOUNTER — Other Ambulatory Visit: Payer: Self-pay

## 2018-09-01 ENCOUNTER — Ambulatory Visit (HOSPITAL_COMMUNITY)
Admit: 2018-09-01 | Discharge: 2018-09-01 | Disposition: A | Discharge status: Home / Self Care | Payer: Medicare Other | Source: Ambulatory Visit | Attending: Cardiology | Admitting: Cardiology

## 2018-09-01 ENCOUNTER — Other Ambulatory Visit (HOSPITAL_COMMUNITY): Payer: Self-pay

## 2018-09-01 ENCOUNTER — Encounter (HOSPITAL_COMMUNITY): Payer: Self-pay

## 2018-09-01 VITALS — BP 106/82 | HR 72 | Wt 149.6 lb

## 2018-09-01 DIAGNOSIS — I639 Cerebral infarction, unspecified: Secondary | ICD-10-CM | POA: Diagnosis not present

## 2018-09-01 DIAGNOSIS — G8929 Other chronic pain: Secondary | ICD-10-CM | POA: Insufficient documentation

## 2018-09-01 DIAGNOSIS — I4510 Unspecified right bundle-branch block: Secondary | ICD-10-CM | POA: Insufficient documentation

## 2018-09-01 DIAGNOSIS — Z79899 Other long term (current) drug therapy: Secondary | ICD-10-CM | POA: Diagnosis not present

## 2018-09-01 DIAGNOSIS — I5032 Chronic diastolic (congestive) heart failure: Secondary | ICD-10-CM

## 2018-09-01 DIAGNOSIS — D649 Anemia, unspecified: Secondary | ICD-10-CM | POA: Diagnosis not present

## 2018-09-01 DIAGNOSIS — R569 Unspecified convulsions: Secondary | ICD-10-CM | POA: Diagnosis not present

## 2018-09-01 DIAGNOSIS — I425 Other restrictive cardiomyopathy: Secondary | ICD-10-CM | POA: Insufficient documentation

## 2018-09-01 DIAGNOSIS — I4892 Unspecified atrial flutter: Secondary | ICD-10-CM | POA: Diagnosis not present

## 2018-09-01 DIAGNOSIS — E039 Hypothyroidism, unspecified: Secondary | ICD-10-CM | POA: Insufficient documentation

## 2018-09-01 DIAGNOSIS — I4891 Unspecified atrial fibrillation: Secondary | ICD-10-CM | POA: Diagnosis not present

## 2018-09-01 DIAGNOSIS — I5033 Acute on chronic diastolic (congestive) heart failure: Secondary | ICD-10-CM | POA: Diagnosis not present

## 2018-09-01 DIAGNOSIS — M109 Gout, unspecified: Secondary | ICD-10-CM | POA: Diagnosis not present

## 2018-09-01 DIAGNOSIS — F1290 Cannabis use, unspecified, uncomplicated: Secondary | ICD-10-CM | POA: Diagnosis not present

## 2018-09-01 DIAGNOSIS — Z7901 Long term (current) use of anticoagulants: Secondary | ICD-10-CM | POA: Diagnosis not present

## 2018-09-01 DIAGNOSIS — I493 Ventricular premature depolarization: Secondary | ICD-10-CM

## 2018-09-01 DIAGNOSIS — I69392 Facial weakness following cerebral infarction: Secondary | ICD-10-CM | POA: Insufficient documentation

## 2018-09-01 DIAGNOSIS — I110 Hypertensive heart disease with heart failure: Secondary | ICD-10-CM | POA: Diagnosis not present

## 2018-09-01 DIAGNOSIS — Z79890 Hormone replacement therapy (postmenopausal): Secondary | ICD-10-CM | POA: Diagnosis not present

## 2018-09-01 LAB — BASIC METABOLIC PANEL
Anion gap: 5 (ref 5–15)
BUN: 17 mg/dL (ref 6–20)
CO2: 26 mmol/L (ref 22–32)
Calcium: 8.9 mg/dL (ref 8.9–10.3)
Chloride: 105 mmol/L (ref 98–111)
Creatinine, Ser: 1.02 mg/dL (ref 0.61–1.24)
GFR calc Af Amer: >60 mL/min (ref >60)
GFR calc non Af Amer: >60 mL/min (ref >60)
Glucose, Bld: 94 mg/dL (ref 70–99)
Potassium: 3.7 mmol/L (ref 3.5–5.1)
Sodium: 136 mmol/L (ref 135–145)

## 2018-09-01 MED ORDER — SPIRONOLACTONE 25 MG PO TABS: 12.5000 mg | ORAL_TABLET | Freq: Every day | ORAL | 3 refills | Status: DC

## 2018-09-01 NOTE — Progress Notes (Signed)
Paramedicine CSW Initial Assessment  Yafet Cline is enrolled in the [employer] Paramedicine Program through [facility].  The patient presents today in association with an [facility] Appointment.  Patient seen today by CSW for follow up/assistance on Financial difficulties Health problems and/or support..   Social Determinants impacting successful heart failure regimen:  Housing: patient lives with wife and children. Food: Do you have enough food? Yes  Do you know and understand healthy eating and how that affects your heart failure diagnosis? Yes  Do you follow a low salt diet?  Yes  Utilities: Do you have gas and/or electricity on in your home? Yes  Income: What is your source of income? Disability Insurance: Medicare and Medicaid Transportation: Do you have transportation to your medical appointments?Yes  If yes, how? self    Daily Health Needs: Do you have a scale and weigh each day?  Yes  Are you able to adhere to your medication regimen? Yes  Do you ever take medications differently than prescribed? No  Do you know the zones of Heart Failure?  Yes  Do you know how to contact the HF Clinic appropriately with worsening symptoms or weight increases?  Yes   Do you have an Advanced Directive?  No  If not, would you like to complete? Will discuss on next visit.  Do you have any identified obstacles / challenges for adherence to current treatment plan?Yes    Patient appears to be overwhelmed with life stressors and challenged with adherence at times.   CSW assisted patient with Supportive Counseling with the goal to Increase healthy adjustment to current life circumstances, Increase adequate support systems for patient/family and Improve medication compliance  [facility] will continue to coordinate and monitor patient's treatment plan. CSW continues to be available for identified needs.  Raquel Sarna, [place], [place]

## 2018-09-01 NOTE — Progress Notes (Signed)
Paramedicine Encounter   Patient ID: Charles Hawkins , male,   DOB: Oct 02, 1971,47 y.o.,  MRN: [id number]   Met patient in clinic today with provider.  Time spent with patient 71mn   B/P-106/82 P-72 [id number] Weight @ clinic-149 @ [id number] [facility] [address]  Pt called pathways to verify benefits for ensure but it is not covered due to not being 100% nutrition.  Medicaid is active.  He does smoke marijuana for increased appetite. He is aware for any possible heart transplant he does need to be clean for 6 months. He was given the number to call for f/u. [place] that provider is only in office on thursdays at 1 and that interferes with pts son schedule- he need to be home for him to get off bus.  He does have crock pot so will offer some recipes for him for that.  Reminded him of mens support group meetings.   KMarylouise Stacks [place]11/01/2018

## 2018-09-01 NOTE — Addendum Note (Signed)
Encounter addended by: Louann Liv, LCSW on: 09/01/2018 4:01 PM  Actions taken: Sign clinical note

## 2018-09-01 NOTE — Patient Instructions (Addendum)
START Spironolactone 12.5mg  (1/2 tab) at night.    Labs done today. Will call you to discuss if results are abnormal.   Repeat labs in 7-10 days at our clinic.   Your physician recommends that you schedule a follow up with Dr. Posey Pronto at [facility]. Call their office at [phone] to reschedule.   Your physician recommends that you schedule a follow-up appointment in: 4 weeks in clinic with NP/PA.

## 2018-09-04 ENCOUNTER — Telehealth (HOSPITAL_COMMUNITY): Payer: Self-pay | Admitting: Licensed Clinical Social Worker

## 2018-09-04 NOTE — Telephone Encounter (Signed)
CSW called to follow up with [employer] regarding benefits check for patient's ensure.  [employer] states that Medicaid follows Medicare guidelines regarding ensure and they will only cover a product if it is administered through tube feedings.  CSW called pt to update regarding determination- informed pt that we have coupons for discounted ensure in the clinic which would could provide to him next time he comes in  [place] will continue to follow in clinic and assist as needed  Jorge Ny, [place] Worker [facility] [phone]

## 2018-09-10 ENCOUNTER — Ambulatory Visit (HOSPITAL_COMMUNITY)
Admission: RE | Admit: 2018-09-10 | Discharge: 2018-09-10 | Disposition: A | Discharge status: Home / Self Care | Payer: Medicare Other | Source: Ambulatory Visit | Attending: Internal Medicine | Admitting: Internal Medicine

## 2018-09-10 DIAGNOSIS — I5033 Acute on chronic diastolic (congestive) heart failure: Secondary | ICD-10-CM | POA: Diagnosis not present

## 2018-09-10 LAB — BASIC METABOLIC PANEL
Anion gap: 8 (ref 5–15)
BUN: 18 mg/dL (ref 6–20)
CO2: 24 mmol/L (ref 22–32)
Calcium: 8.9 mg/dL (ref 8.9–10.3)
Chloride: 101 mmol/L (ref 98–111)
Creatinine, Ser: 1.11 mg/dL (ref 0.61–1.24)
GFR calc Af Amer: >60 mL/min (ref >60)
GFR calc non Af Amer: >60 mL/min (ref >60)
Glucose, Bld: 99 mg/dL (ref 70–99)
Potassium: 3.9 mmol/L (ref 3.5–5.1)
Sodium: 133 mmol/L — ABNORMAL LOW (ref 135–145)

## 2018-09-11 ENCOUNTER — Other Ambulatory Visit (HOSPITAL_COMMUNITY): Payer: Self-pay

## 2018-09-11 NOTE — Progress Notes (Signed)
Paramedicine Encounter    Patient ID: Charles Hawkins, male    DOB: 13-Jun-1971, 47 y.o.   MRN: [id number]   Patient Care Team: Benito Mccreedy, MD as PCP - General (Internal Medicine) Larey Dresser, MD as PCP - Advanced Heart Failure (Cardiology)  Patient Active Problem List   Diagnosis Date Noted  . Hyponatremia 08/15/2018  . HCAP (healthcare-associated pneumonia) 08/15/2018  . Protein-calorie malnutrition, severe 08/15/2018  . Community acquired pneumonia   . Hypotension 08/14/2018  . HTN (hypertension) 05/20/2018  . Hyperlipemia 05/20/2018  . Tobacco use 05/20/2018  . Family hx-stroke 05/20/2018  . CHF (congestive heart failure) ([place]) 05/20/2018  . Restrictive cardiomyopathy ([place])   . 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 Atrial flutter ([place]) 12/06/2014  . Acute on chronic diastolic CHF (congestive heart failure) ([place]) 03/03/2014  . Palpitations 03/03/2014  . Acute gout 02/06/2014  . Infiltrative cardiomyopathy ([place]) 02/06/2014    Current Outpatient Medications:  .  atorvastatin (LIPITOR) 10 MG tablet, Take 1 tablet (10 mg total) by mouth daily at 6 PM., Disp: 30 tablet, Rfl: 2 .  ELIQUIS 5 MG TABS tablet, TAKE 1 TABLET (5 MG TOTAL) BY MOUTH 2 (TWO) TIMES DAILY., Disp: 60 tablet, Rfl: 5 .  feeding supplement, ENSURE ENLIVE, (ENSURE ENLIVE) LIQD, Take 237 mLs by mouth 3 (three) times daily between meals., Disp: , Rfl:  .  furosemide (LASIX) 40 MG tablet, Take 80 mg (2 tablets) in the morning & take 40 mg ( 1 tablet) in the evening, Disp: 90 tablet, Rfl: 0 .  levothyroxine (SYNTHROID, LEVOTHROID) 25 MCG tablet, Take 25 mcg by mouth daily before breakfast. , Disp: , Rfl: 0 .  Multiple Vitamin (MULTIVITAMIN WITH MINERALS) TABS tablet, Take 1 tablet by mouth daily. , Disp: , Rfl:  .  potassium chloride (MICRO-K) 10 MEQ CR capsule, Take 4 capsules (40 mEq total) by mouth daily., Disp: 120 capsule, Rfl: 0 .  spironolactone (ALDACTONE) 25 MG  tablet, Take 0.5 tablets (12.5 mg total) by mouth at bedtime., Disp: 15 tablet, Rfl: 3 Allergies  Allergen Reactions  . Lyrica [Pregabalin] Anaphylaxis  . Nsaids Other (See Comments)      Social History   Socioeconomic History  . Marital status: Single    Spouse name: Not on file  . Number of children: Not on file  . Years of education: Not on file  . Highest education level: Not on file  Occupational History  . Not on file  Social Needs  . Financial resource strain: Not on file  . Food insecurity:    Worry: Not on file    Inability: Not on file  . Transportation needs:    Medical: Not on file    Non-medical: Not on file  Tobacco Use  . Smoking status: Former Smoker    Packs/day: 0.25    Types: Cigarettes  . Smokeless tobacco: Never Used  Substance and Sexual Activity  . Alcohol use: Yes    Alcohol/week: 1.0 standard drinks    Types: 1 Cans of beer per week    Comment: occasionally  . Drug use: Yes    Types: Marijuana    Comment: marijuana occasionally  . Sexual activity: Not on file  Lifestyle  . Physical activity:    Days per week: Not on file    Minutes per session: Not on file  . Stress: Not on file  Relationships  . Social connections:    Talks on phone: Not on file  Gets together: Not on file    Attends religious service: Not on file    Active member of club or organization: Not on file    Attends meetings of clubs or organizations: Not on file    Relationship status: Not on file  . Intimate partner violence:    Fear of current or ex partner: Not on file    Emotionally abused: Not on file    Physically abused: Not on file    Forced sexual activity: Not on file  Other Topics Concern  . Not on file  Social History Narrative  . Not on file    Physical Exam      Future Appointments  Date Time Provider [place]  10/01/2018 10:30 AM [facility] PA/NP [facility] None  10/15/2018 10:45 AM Venancio Poisson, NP [facility] None    BP 100/70    Pulse 80   Resp 14   Weight yesterday-143  Last visit weight-149 @ clinic   Pt reports he is doing ok, he was able to join the mens group yesterday and seemed to enjoy it, he states he would likely go back.  He reports no issues with taking new med of spiro.  Pt denies any dizziness, no sob, no c/p. Pt did not weigh today.  He has home bp cuff and we checked that to see accuracy and it read 106/76 so very close to what I got.  Pt denies any need of anything right now. Has all meds.   Marylouise Stacks, [place] [facility] Paramedic  09/11/18

## 2018-09-12 ENCOUNTER — Other Ambulatory Visit: Payer: Self-pay

## 2018-09-12 NOTE — Patient Outreach (Signed)
Telephone outreach to patient to obtain mRs was successfully completed. mRs= 0. 

## 2018-09-24 ENCOUNTER — Other Ambulatory Visit (HOSPITAL_COMMUNITY): Payer: Self-pay

## 2018-09-24 MED ORDER — POTASSIUM CHLORIDE ER 10 MEQ PO CPCR: 40.0000 meq | ORAL_CAPSULE | Freq: Every day | ORAL | 3 refills | Status: DC

## 2018-09-24 MED ORDER — ATORVASTATIN CALCIUM 10 MG PO TABS: 10.0000 mg | ORAL_TABLET | Freq: Every day | ORAL | 5 refills | Status: DC

## 2018-10-01 ENCOUNTER — Encounter (HOSPITAL_COMMUNITY): Payer: Medicare Other

## 2018-10-01 DIAGNOSIS — I425 Other restrictive cardiomyopathy: Secondary | ICD-10-CM | POA: Diagnosis not present

## 2018-10-01 DIAGNOSIS — I429 Cardiomyopathy, unspecified: Secondary | ICD-10-CM | POA: Diagnosis not present

## 2018-10-01 DIAGNOSIS — I5020 Unspecified systolic (congestive) heart failure: Secondary | ICD-10-CM | POA: Diagnosis not present

## 2018-10-01 DIAGNOSIS — R7303 Prediabetes: Secondary | ICD-10-CM | POA: Diagnosis not present

## 2018-10-01 DIAGNOSIS — I739 Peripheral vascular disease, unspecified: Secondary | ICD-10-CM | POA: Diagnosis not present

## 2018-10-01 DIAGNOSIS — I10 Essential (primary) hypertension: Secondary | ICD-10-CM | POA: Diagnosis not present

## 2018-10-01 DIAGNOSIS — Z720 Tobacco use: Secondary | ICD-10-CM | POA: Diagnosis not present

## 2018-10-01 DIAGNOSIS — M109 Gout, unspecified: Secondary | ICD-10-CM | POA: Diagnosis not present

## 2018-10-07 ENCOUNTER — Other Ambulatory Visit (HOSPITAL_COMMUNITY): Payer: Self-pay

## 2018-10-07 NOTE — Progress Notes (Signed)
Paramedicine Encounter    Patient ID: Charles Hawkins, male    DOB: 12/25/1970, 47 y.o.   MRN: [id number]   Patient Care Team: Benito Mccreedy, MD as PCP - General (Internal Medicine) Larey Dresser, MD as PCP - Advanced Heart Failure (Cardiology)  Patient Active Problem List   Diagnosis Date Noted  . Hyponatremia 08/15/2018  . HCAP (healthcare-associated pneumonia) 08/15/2018  . Protein-calorie malnutrition, severe 08/15/2018  . Community acquired pneumonia   . Hypotension 08/14/2018  . HTN (hypertension) 05/20/2018  . Hyperlipemia 05/20/2018  . Tobacco use 05/20/2018  . Family hx-stroke 05/20/2018  . CHF (congestive heart failure) ([place]) 05/20/2018  . Restrictive cardiomyopathy ([place])   . 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 Atrial flutter ([place]) 12/06/2014  . Acute on chronic diastolic CHF (congestive heart failure) ([place]) 03/03/2014  . Palpitations 03/03/2014  . Acute gout 02/06/2014  . 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  atorvastatin (LIPITOR) 10 MG tablet, Take 1 tablet (10 mg total) by mouth daily at 6 PM., Disp: 30 tablet, Rfl: 5 .  ELIQUIS 5 MG TABS tablet, TAKE 1 TABLET (5 MG TOTAL) BY MOUTH 2 (TWO) TIMES DAILY., Disp: 60 tablet, Rfl: 5 .  furosemide (LASIX) 40 MG tablet, Take 80 mg (2 tablets) in the morning & take 40 mg ( 1 tablet) in the evening (Patient taking differently: Take 80 mg (2 tablets) in the morning & take 40 mg ( 1 tablet) in the evening), Disp: 90 tablet, Rfl: 0 .  levothyroxine (SYNTHROID, LEVOTHROID) 25 MCG tablet, Take 25 mcg by mouth daily before breakfast. , Disp: , Rfl: 0 .  Multiple Vitamin (MULTIVITAMIN WITH MINERALS) TABS tablet, Take 1 tablet by mouth daily. , Disp: , Rfl:  .  potassium chloride (MICRO-K) 10 MEQ CR capsule, Take 4 capsules (40 mEq total) by mouth daily., Disp: 120 capsule, Rfl: 3 .  spironolactone (ALDACTONE) 25 MG tablet, Take 0.5 tablets  (12.5 mg total) by mouth at bedtime. (Patient taking differently: Take 12.5 mg by mouth at bedtime. ), Disp: 15 tablet, Rfl: 3 .  feeding supplement, ENSURE ENLIVE, (ENSURE ENLIVE) LIQD, Take 237 mLs by mouth 3 (three) times daily between meals. (Patient not taking: Reported on 10/07/2018), Disp: , Rfl:  Allergies  Allergen Reactions  . Lyrica [Pregabalin] Anaphylaxis  . Nsaids Other (See Comments)      Social History   Socioeconomic History  . Marital status: Single    Spouse name: Not on file  . Number of children: Not on file  . Years of education: Not on file  . Highest education level: Not on file  Occupational History  . Not on file  Social Needs  . Financial resource strain: Not on file  . Food insecurity:    Worry: Not on file    Inability: Not on file  . Transportation needs:    Medical: Not on file    Non-medical: Not on file  Tobacco Use  . Smoking status: Former Smoker    Packs/day: 0.25    Types: Cigarettes  . Smokeless tobacco: Never Used  Substance and Sexual Activity  . Alcohol use: Yes    Alcohol/week: 1.0 standard drinks    Types: 1 Cans of beer per week    Comment: occasionally  . Drug use: Yes    Types: Marijuana    Comment: marijuana occasionally  . Sexual activity: Not on file  Lifestyle  . Physical activity:  Days per week: Not on file    Minutes per session: Not on file  . Stress: Not on file  Relationships  . Social connections:    Talks on phone: Not on file    Gets together: Not on file    Attends religious service: Not on file    Active member of club or organization: Not on file    Attends meetings of clubs or organizations: Not on file    Relationship status: Not on file  . Intimate partner violence:    Fear of current or ex partner: Not on file    Emotionally abused: Not on file    Physically abused: Not on file    Forced sexual activity: Not on file  Other Topics Concern  . Not on file  Social History Narrative  . Not on  file    Physical Exam      Future Appointments  Date Time Provider [place]  10/14/2018  8:30 AM [facility] PA/NP [facility] None  10/15/2018 10:45 AM Venancio Poisson, NP [facility] None    BP (!) 112/0 Comment: palpated  Pulse 64   Resp 14   Wt 148 lb (67.1 kg)   SpO2 94%   BMI 22.50 kg/m  His home cuff-102/78 Weight yesterday-147  Last visit weight-143  Pt reports he is feeling good. He has been busy around the house, staying active as much as he can. he denies any dizziness, no sob. Good appetite. He states if he does too much walking-which he avoids- or lifting anything too heavy will cause him to get sob but again he tries to avoid those situations.  He reports he is taking lasix 40mg  in am and 20 in pm, he sometimes takes 80 in am and 40 in pm when needed.  He was questioning about his meds and potentially coming off some--I went thru his list and explained purpose for each of his meds.   No ensure.  meds verified-he does not use pill box-uses straight from bottle.  He did not take his meds yet this morning.   Marylouise Stacks, [place] [facility] Paramedic  10/07/18

## 2018-10-13 NOTE — Progress Notes (Signed)
Patient ID: Charles Hawkins, male   DOB: Aug 01, 1971, 47 y.o.   MRN: [id number]  ADVANCED HF CLINIC CONSULT NOTE  HF: Dr Charles Hawkins   HPI: Charles Hawkins is a 47 y.o. male with h/o HTN, restrictive CMP, Afib, h/o bilateral club feet s/p surgical repair, gout, arthritis, h/o CVA on Eliquis, and chronic pain.   Son had heart transplant at 60 age 45 months old due to reported infiltrative CM (exact diagnosis not reported in records I have). Apparently after that, Charles Hawkins underwent cardiac workup and cMRI suggested infiltrative CM with normal EF. Has had exhaustive w/u since that time under the guidance of Dr. Loyal Hawkins at [facility] without clear diagnosis. 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 Wife got job in [employer] with Charles Hawkins has now relocated to the Charles Hawkins. Hawkins denies FHx of HF except for his son  Charles Hawkins in hematology and Charles Hawkins in Rheumatology and Hawkins was apparently told Hawkins did not have a systemic problem at that time.   Seen for Acute CVA in 05/19/18. Hawkins had only missed one dose of Eliquis as outpatient. Had mild left facial droop as only persistent deficit. Was supposed to follow up with Dr Charles Hawkins at [facility] in the transplant center, but has moved to [facility] so never did.   Admitted 10/17-10/22/19 with pneumonia and hypotension. Hawkins was treated with IV abx, then transitioned to Augmentin. HF team consulted for volume overload. Hawkins was diuresed with IV lasix, then transitioned to lasix 80 mg am, 40 mg pm. DC weight: 146 lbs.  Hawkins returns today for HF follow up. Last visit started on spiro. Recheck labs stable. Overall doing well. Denies SOB. Able to do yardwork, but says Hawkins uses scooter in the grocery store. Says Hawkins can lie flat for about 4 hours. Denies PND or edema. No CP or palpitations. No dizziness. No bleeding on Eliquis. Taking all medications, but sometimes takes lasix 40 mg BID instead of 80/40 depending on his weight. Usually takes as ordered. Limits fluid and salt intake. No  tobacco use. Weights stable 143-146 lbs at home. Followed by HF paramedicine.  Studies:  - cMRI - LVEF 63% moderate concentric LVH 1.6 cm. RV normal. Biatrial enlargement, Diffuse infiltrative process involving both ventricles - EM biopsy 11/14 - negative - Fat pad biopsy - neagtive - Serologies all normal - Had ETT in 9/14 (no CPX). 7.6 METs HR 76-156 - Event monitor 5/15; SR with PVCs - Repeat cMRI in 5/15      1. Normal LV size and systolic function, EF 73%. Mild LV     hypertrophy. Normal RV size and systolic function.    2. Moderate to severe biatrial enlargement.    3. Evidence for diffuse infiltrative process by delayed enhancement     imaging, possible cardiac amyloidosis. - Had CPX 4/15 which showed significant cardiac limitation  Resting HR: 75 Peak HR: 130 (72% age predicted max HR) BP rest: 102/58 BP peak: 128/62 Peak VO2: 13.9 (38.8% predicted peak VO2) VE/VCO2 slope: 41.4 OUES: 1.09 Peak RER: 1.13 Ventilatory Threshold: 10.2 (28.5% predicted peak VO2) Peak RR 32 Peak Ventilation: 46.3 VE/MVV: 32.2% PETCO2 at peak: 26 O2pulse: 8 (57% predicted O2pulse) - Echo (2/16) with EF 35-40%, thick LV and RV with starry night pattern, moderately hypokinetic RV, severe biatrial enlargement.   Charles Dr. Posey Hawkins at [facility] on 06/04/14. They suggested stopping smoking and starting process for OHTx work-up. Returns to [facility] later this month.   Event monitor was done  recently, showing multiple runs of atrial tach/flutter 150-160s. Hawkins was started on amiodarone and apixaban.  Labs (2/16): K 4.2, creatinine 0.89, AST 56, ALT 28, tbili 2.1, HCT 41.1 Labs (5/16): K 3.7, creatinine 0.93, TSH 8.357, BNP 2282 => 1242, AST 52 => 62, ALT 26 => 41, tbili 2.3, HCT 42.8 Labs (04/13/15): K 4.0, Creatinine 1.05  Review of systems complete and found to be negative unless listed in HPI.   Current Outpatient Medications on File Prior to Encounter  Medication Sig Dispense Refill  .  atorvastatin (LIPITOR) 10 MG tablet Take 1 tablet (10 mg total) by mouth daily at 6 PM. 30 tablet 5  . ELIQUIS 5 MG TABS tablet TAKE 1 TABLET (5 MG TOTAL) BY MOUTH 2 (TWO) TIMES DAILY. 60 tablet 5  . feeding supplement, ENSURE ENLIVE, (ENSURE ENLIVE) LIQD Take 237 mLs by mouth 3 (three) times daily between meals.    . furosemide (LASIX) 40 MG tablet Take 80 mg (2 tablets) in the morning & take 40 mg ( 1 tablet) in the evening (Patient taking differently: Take 80 mg (2 tablets) in the morning & take 40 mg ( 1 tablet) in the evening) 90 tablet 0  . levothyroxine (SYNTHROID, LEVOTHROID) 25 MCG tablet Take 25 mcg by mouth daily before breakfast.   0  . Multiple Vitamin (MULTIVITAMIN WITH MINERALS) TABS tablet Take 1 tablet by mouth daily.     . potassium chloride (MICRO-K) 10 MEQ CR capsule Take 4 capsules (40 mEq total) by mouth daily. 120 capsule 3  . spironolactone (ALDACTONE) 25 MG tablet Take 0.5 tablets (12.5 mg total) by mouth at bedtime. (Patient taking differently: Take 12.5 mg by mouth at bedtime. ) 15 tablet 3   No current facility-administered medications on file prior to encounter.     PHYSICAL EXAM: Vitals:   10/14/18 0831  BP: 102/64  Pulse: 65  SpO2: 93%  Weight: 66.1 kg (145 lb 12.8 oz)   Wt Readings from Last 3 Encounters:  10/14/18 66.1 kg (145 lb 12.8 oz)  10/07/18 67.1 kg (148 lb)  09/01/18 67.9 kg (149 lb 9.6 oz)   General: Well appearing. No resp difficulty. HEENT: Normal Neck: Supple. JVP ~8. Carotids 2+ bilat; no bruits. No thyromegaly or nodule noted. Cor: PMI nondisplaced. IRR, No M/G/R noted +S4 Lungs: CTAB, normal effort. Abdomen: Soft, non-tender, non-distended, no HSM. No bruits or masses. +BS  Extremities: No cyanosis, clubbing, or rash. R and LLE no edema.  Neuro: Alert & orientedx3, cranial nerves grossly intact. moves all 4 extremities w/o difficulty. Affect pleasant  ASSESSMENT & PLAN:  1. 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 Hawkins has had transplant evaluation and was not listed due to tobacco and THC use. -Last seen 11/2017 by [facility] - Echo 05/19/18 EF 55-60%. - NYHA II-III, difficult to assess with decreased activity. - Volume status stable on exam. - Continue lasix 80 mg am, 40 mg pm. Okay to take extra 40 mg PRN.  Occasionally takes 40 mg BID instead. - Continue spiro 12.5 mg qHS. Recent BMET stable.  - Has previously refused coreg. Was supposed to see Dr. Posey Hawkins in f/u and discuss. -Previously onentrestobut stoppeddue to intolerance. - With previous LV dysfunction, could consider ARB/spiro as tolerated.Hawkins refuses.  -Son had heart transplant at 36 age 35 months old due to reported infiltrative CM (exact diagnosis not reported in records I have).  -cMRI 5/15Normal LV size and systolic function, EF 29%. Mild LV hypertrophy.  Normal RV size and systolic function,moderate to severe biatrial enlargement,Evidence for diffuse infiltrative process by delayed enhancementimaging, possible cardiac amyloidosis. - Encouraged him to follow up with Dr Charles Hawkins for transplant evaluation. Hawkins has some scheduling conflicts, but encouraged him to call to sort out a time that works. - Continue HF paramedicine.   2.CVA7/2019 -Hawkins underwent emergent CTA7/2053found a right M1 MCA occlusion consistent with an embolic stroke.Underwentendovascular revascularization of the rightMCAwith x 1 pass with 69mm x 55mm embotrap retrieve device achieving a TICI 3reperfusion. - Continue Eliquis and statin. Denies bleeding.   3. A fibrillation/Flutter - Remains in Afib/Flutter -Amiodaronepreviouslystopped by his PCP due to low BP and hypothyroidism. - Has previously been inA flutterand discussedA flutter ablationbut did not go to appt. - Continue eliquis 5 mg BID. Denies bleeding.  - Rate controlled.   4. Anemia - Hgb up to 13.1 on 10/19. Iron panel done.  Does not qualify for IV iron.  - Suspect chronic component.   5. Seizure like activity inpatient - Hawkins had reported "jerking", but family states Hawkins had seizure like activity with eyes rolling in the back of his head and myoclonic jerks.  - Reports similar in the past on Gabapentin and ? If related to cymbalta. Per PCP - Neurology following. EEG normal. Twitches noted during exam, but no epileptiform correlation.  - Plan to follow up with neuro prn if has repeat spells. No change.   6. Frequent PVCs - Previously taken off amio for afib due to low BP and hypothyroidism. No change.  Encouraged him to follow up with Dr Charles Hawkins. Hawkins has some scheduling difficulties with childcare. Will work on [employer] paperwork and contact him about it later this week. Follow up in 2 months with Dr Orson Eva Koren Bound 10/14/2018  Greater than 50% of the 25 minute visit was spent in counseling/coordination of care regarding disease state education, salt/fluid restriction, sliding scale diuretics, and medication compliance.

## 2018-10-14 ENCOUNTER — Encounter (HOSPITAL_COMMUNITY): Payer: Self-pay

## 2018-10-14 ENCOUNTER — Other Ambulatory Visit (HOSPITAL_COMMUNITY): Payer: Self-pay

## 2018-10-14 ENCOUNTER — Ambulatory Visit (HOSPITAL_COMMUNITY)
Admission: RE | Admit: 2018-10-14 | Discharge: 2018-10-14 | Disposition: A | Discharge status: Home / Self Care | Payer: Medicare Other | Source: Ambulatory Visit | Attending: Cardiology | Admitting: Cardiology

## 2018-10-14 VITALS — BP 102/64 | HR 65 | Wt 145.8 lb

## 2018-10-14 DIAGNOSIS — I4891 Unspecified atrial fibrillation: Secondary | ICD-10-CM | POA: Insufficient documentation

## 2018-10-14 DIAGNOSIS — I425 Other restrictive cardiomyopathy: Secondary | ICD-10-CM | POA: Diagnosis not present

## 2018-10-14 DIAGNOSIS — Z720 Tobacco use: Secondary | ICD-10-CM | POA: Diagnosis not present

## 2018-10-14 DIAGNOSIS — Z7901 Long term (current) use of anticoagulants: Secondary | ICD-10-CM | POA: Diagnosis not present

## 2018-10-14 DIAGNOSIS — G8929 Other chronic pain: Secondary | ICD-10-CM | POA: Insufficient documentation

## 2018-10-14 DIAGNOSIS — I4892 Unspecified atrial flutter: Secondary | ICD-10-CM | POA: Insufficient documentation

## 2018-10-14 DIAGNOSIS — I5032 Chronic diastolic (congestive) heart failure: Secondary | ICD-10-CM | POA: Diagnosis not present

## 2018-10-14 DIAGNOSIS — Z79890 Hormone replacement therapy (postmenopausal): Secondary | ICD-10-CM | POA: Diagnosis not present

## 2018-10-14 DIAGNOSIS — I493 Ventricular premature depolarization: Secondary | ICD-10-CM | POA: Insufficient documentation

## 2018-10-14 DIAGNOSIS — I110 Hypertensive heart disease with heart failure: Secondary | ICD-10-CM | POA: Insufficient documentation

## 2018-10-14 DIAGNOSIS — E039 Hypothyroidism, unspecified: Secondary | ICD-10-CM | POA: Insufficient documentation

## 2018-10-14 DIAGNOSIS — Z79899 Other long term (current) drug therapy: Secondary | ICD-10-CM | POA: Diagnosis not present

## 2018-10-14 DIAGNOSIS — M109 Gout, unspecified: Secondary | ICD-10-CM | POA: Diagnosis not present

## 2018-10-14 DIAGNOSIS — Z8673 Personal history of transient ischemic attack (TIA), and cerebral infarction without residual deficits: Secondary | ICD-10-CM | POA: Insufficient documentation

## 2018-10-14 DIAGNOSIS — D649 Anemia, unspecified: Secondary | ICD-10-CM | POA: Insufficient documentation

## 2018-10-14 DIAGNOSIS — G253 Myoclonus: Secondary | ICD-10-CM | POA: Insufficient documentation

## 2018-10-14 DIAGNOSIS — R29810 Facial weakness: Secondary | ICD-10-CM | POA: Diagnosis not present

## 2018-10-14 DIAGNOSIS — R569 Unspecified convulsions: Secondary | ICD-10-CM | POA: Diagnosis not present

## 2018-10-14 NOTE — Progress Notes (Signed)
Paramedicine Encounter   Patient ID: Charles Hawkins , male,   DOB: 02-22-1971,47 y.o.,  MRN: [id number]   Met patient in clinic today with provider.  Time spent with patient 65mn  B/p-102/64 p-65 sp02-93% Weight @ clinic-145  Pt reports doing well. He was encouraged to f/u with [facility] provider again. Taking all meds, denies c/p, no dizziness. Takes breaks in grocery store if he goes due to him getting tired. He is also limited by his foot/ankle pains.  Overall doing ok. No edema noted today.  He is self dosing with his lasix as mentioned in last home visit.   KMarylouise Stacks EWadena12/17/2019   ACTION: Home visit completed

## 2018-10-14 NOTE — Patient Instructions (Addendum)
Your physician recommends that you schedule a follow up with Dr. Posey Pronto at [facility]. Call their office at [phone] to reschedule.    Follow up with Dr. Haroldine Laws in 2 months

## 2018-10-14 NOTE — Progress Notes (Deleted)
[facility] [address] [place]. [place] [address] [phone]       OFFICE FOLLOW UP NOTE  [name] Date of Birth:  January 14, 1971 Medical Record Number:  [id number]   Reason for Referral:  hospital stroke follow up  CHIEF COMPLAINT:  No chief complaint on file.   HPI: Charles Hawkins is being seen today for initial visit in the office for right frontal cortical infarct embolic secondary to known atrial flutter with right M1 occlusion status post endovascular therapy on 05/18/2018. History obtained from patient and chart review. Reviewed all radiology images and labs personally.  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 No tPA d/t Eliquis use.  CT had reviewed and showed hypoattenuation in the insular ribbon with right MCA more dense than left.  CTA head and neck showed distal right M1 MCA occlusion and mild arthrosclerosis.  Cerebral angiogram was performed which showed occluded right M1 MCA and underwent endovascular therapy with TICI 3 reperfusion.  MRI head reviewed and showed right insular, right frontal operculum, punctate posterior right frontal cortical infarct along with old high right parietal cortical infarcts.  2D echo showing EF of 55 to 60% with LA moderately to severely dilated, RA severely dilated and persistent pulmonary artery hypertension and right ventricular dysfunction and increased right atrial pressure.  LDL 71 and recommended starting Lipitor 10 mg daily.  Patient does have a history of atrial flutter and was on Eliquis PTA.  Per notes, he did miss 1 dose and prior to his stroke therefore was recommended to continue Eliquis 5 mg twice  daily.  Recommended for follow-up with Dr. Posey Pronto at [facility] cardiology as outpatient along with follow-up with Dr. Estanislado Pandy for follow-up of IR procedure patient was discharged home in satisfactory condition.  07/10/2018 visit: Patient is being seen today for hospital follow-up.  Overall he has been doing well without residual deficits.  He continues to take the Eliquis without side effects of bleeding or bruising.  Continues to take Lipitor without side effects myalgias.  It was recommended for patient to follow-up with Dr. Posey Pronto at [facility] cardiology but it was recommended by Dr. Posey Pronto to follow-up with cardiologist in [place] and patient states follow-up appointment is made.  Blood pressure today 91/66 but this is normal for patient.  Patient has returned back to all previous activities.  Denies new or worsening stroke/TIA symptoms.  Interval history 10/15/2018: Patient is being seen today for 45-month follow-up visit.  ROS:   14 system review of systems performed and negative with exception of no complaints  PMH:  Past Medical History:  Diagnosis Date  . Arthritis   . CHF (congestive heart failure) ([facility])   . HTN (hypertension)   . 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 Multiple areas of subendocardial enhancement on MRI. Fat pad and myocardial biopsy negative at [facility].   . Stroke [facility])     PSH:  Past Surgical History:  Procedure Laterality Date  . IR ANGIO VERTEBRAL SEL SUBCLAVIAN INNOMINATE UNI R MOD SED  05/18/2018  . IR CT HEAD LTD  05/18/2018  . IR PERCUTANEOUS ART THROMBECTOMY/INFUSION INTRACRANIAL INC DIAG ANGIO  05/18/2018  . RADIOLOGY WITH ANESTHESIA N/A 05/18/2018   Procedure: RADIOLOGY WITH ANESTHESIA;  Surgeon: Luanne Bras, MD;  Location: [facility];  Service: Radiology;  Laterality: N/A;    Social History:  Social History   Socioeconomic History  . Marital status: Single    Spouse name: Not on file  . Number of children: Not on file  . Years of education: Not on  file  . Highest education level: Not on file  Occupational History  . Not on file  Social Needs  . Financial resource strain: Not on file  . Food insecurity:    Worry: Not on file    Inability: Not on file  . Transportation needs:    Medical: Not on file    Non-medical: Not on file  Tobacco Use  . Smoking status: Former Smoker    Packs/day: 0.25    Types: Cigarettes  . Smokeless tobacco: Never Used  Substance and Sexual Activity  . Alcohol use: Yes    Alcohol/week: 1.0 standard drinks    Types: 1 Cans of beer per week    Comment: occasionally  . Drug use: Yes    Types: Marijuana    Comment: marijuana occasionally  . Sexual activity: Not on file  Lifestyle  . Physical activity:    Days per week: Not on file    Minutes per session: Not on file  . Stress: Not on file  Relationships  . Social connections:    Talks on phone: Not on file    Gets together: Not on file    Attends religious service: Not on file    Active member of club or organization: Not on file    Attends meetings of clubs or organizations: Not on file    Relationship status: Not on file  . Intimate partner violence:    Fear of current or ex partner: Not on file    Emotionally abused: Not on file    Physically abused: Not on file    Forced sexual activity: Not on file  Other Topics Concern  . Not on file  Social History Narrative  . Not on file    Family History:  Family History  Problem Relation Age of Onset  . Heart failure Son 1       s/p heart transplant  . Stroke Brother 62       Deceased  . Stroke Mother   . Hypertension Father     Medications:   Current Outpatient Medications on File Prior to Visit  Medication Sig Dispense Refill  . atorvastatin (LIPITOR) 10 MG tablet Take 1 tablet (10 mg total) by mouth daily at 6 PM. 30 tablet 5  . ELIQUIS 5 MG TABS tablet TAKE 1 TABLET (5 MG TOTAL) BY MOUTH 2 (TWO) TIMES DAILY. 60 tablet 5  . feeding supplement, ENSURE ENLIVE, (ENSURE ENLIVE) LIQD  Take 237 mLs by mouth 3 (three) times daily between meals.    . furosemide (LASIX) 40 MG tablet Take 80 mg (2 tablets) in the morning & take 40 mg ( 1 tablet) in the evening (Patient taking differently: Take 80 mg (2 tablets) in the morning & take 40 mg ( 1 tablet) in the evening) 90 tablet 0  . levothyroxine (SYNTHROID, LEVOTHROID) 25 MCG tablet Take 25 mcg by mouth daily before breakfast.   0  . Multiple Vitamin (MULTIVITAMIN WITH MINERALS) TABS tablet Take 1 tablet by mouth daily.     . potassium chloride (MICRO-K) 10 MEQ CR capsule Take 4 capsules (40 mEq total) by mouth daily. 120 capsule 3  . spironolactone (ALDACTONE) 25 MG tablet Take 0.5 tablets (12.5 mg total) by mouth  at bedtime. (Patient taking differently: Take 12.5 mg by mouth at bedtime. ) 15 tablet 3   No current facility-administered medications on file prior to visit.     Allergies:   Allergies  Allergen Reactions  . Lyrica [Pregabalin] Anaphylaxis  . Nsaids Other (See Comments)     Physical Exam  There were no vitals filed for this visit. There is no height or weight on file to calculate BMI. No exam data present  General: well developed, well nourished, pleasant middle-aged African-American male, seated, in no evident distress Head: head normocephalic and atraumatic.   Neck: supple with no carotid or supraclavicular bruits Cardiovascular: regular rate and rhythm, no murmurs Musculoskeletal: no deformity Skin:  no rash/petichiae Vascular:  Normal pulses all extremities  Neurologic Exam Mental Status: Awake and fully alert. Oriented to place and time. Recent and remote memory intact. Attention span, concentration and fund of knowledge appropriate. Mood and affect appropriate.  Cranial Nerves: Fundoscopic exam reveals sharp disc margins. Pupils equal, briskly reactive to light. Extraocular movements full without nystagmus. Visual fields full to confrontation. Hearing intact. Facial sensation intact. Face, tongue,  palate moves normally and symmetrically.  Motor: Normal bulk and tone. Normal strength in all tested extremity muscles. Sensory.: intact to touch , pinprick , position and vibratory sensation.  Coordination: Rapid alternating movements normal in all extremities. Finger-to-nose and heel-to-shin performed accurately bilaterally. Gait and Station: Arises from chair without difficulty. Stance is normal. Gait demonstrates normal stride length and balance . Able to heel, toe and tandem walk without difficulty.  Reflexes: 1+ and symmetric. Toes downgoing.    NIHSS  0 Modified Rankin  0 HAS-BLED 1 CHA2DS2-VASc 3   Diagnostic Data (Labs, Imaging, Testing)  CT HEAD WO CONTRAST CT ANGIO HEAD W OR WO CONTRAST CT ANGIO NECK W OR WO CONTRAST 05/18/2018 IMPRESSION: 1. Somewhat reduced quality exam demonstrating equivocal hypoattenuation of the RIGHT insular ribbon is compared to the LEFT. RIGHT MCA may be questionably more dense than LEFT, on multiplanar reformations. 2. ASPECTS is 9.  MR BRAIN WO CONTRAST 05/19/18 IMPRESSION: 1. Mildly increased diffusion abnormality within the right insula and right frontal operculum without frank restriction, consistent with mild ischemic insult. Additional punctate cortical infarct more superiorly within the posterior right frontal region. No other evidence for acute ischemia. 2. 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 3. Otherwise negative brain MRI.  CEREBRAL ANGIOGRAM 05/18/2018 IMPRESSION: Status post endovascular complete revascularization of occluded right middle cerebral artery M1 segment with 1 pass with the 5 mm x 33 mm Embotrap retrieval device achieving a TICI 3 revascularization.  ECHOCARDIOGRAM 05/19/2018 Study Conclusions - Left ventricle: The cavity size was normal. There was moderate   concentric hypertrophy. Systolic function was normal. The   estimated ejection fraction was in the range of 55% to 60%.  Wall   motion was normal; there were no regional wall motion   abnormalities. - Mitral valve: Mild, late systolicprolapse, involving the anterior   leaflet and the posterior leaflet. There was mild to moderate   regurgitation directed centrally. - Left atrium: The atrium was moderately to severely dilated. - Right ventricle: Systolic function was moderately reduced. - Right atrium: The atrium was severely dilated. - Pulmonary arteries: Systolic pressure was moderately increased.   PA peak pressure: 68 mm Hg (S).   ASSESSMENT: Charles Hawkins is a 47 y.o. year old male here with right insular, right frontal perculum, and punctate posterior right frontal cortical infarct on 05/18/2018 secondary to known  AF with right M1 occlusion s/p endovascular therapy with TICI 3. Vascular risk factors include restrictive cardiomyopathy on Eliquis, HTN and HLD.     PLAN: -Continue Eliquis (apixaban) daily  and lipitor  for secondary stroke prevention -F/u with PCP regarding your HLD and HTN management -f/u with cardiologist as scheduled for chronic cardiac conditions and management -continue to monitor BP at home -advised to continue to stay active and maintain a healthy diet -Maintain strict control of hypertension with blood pressure goal below 130/90, diabetes with hemoglobin A1c goal below 6.5% and cholesterol with LDL cholesterol (bad cholesterol) goal below 70 mg/dL. I also advised the patient to eat a healthy diet with plenty of whole grains, cereals, fruits and vegetables, exercise regularly and maintain ideal body weight.  Follow up in 3 months or call earlier if needed   Greater than 50% of time during this 25 minute visit was spent on counseling,explanation of diagnosis of cortical infarcts, reviewing risk factor management of HTN, HLD and atrial flutter, planning of further management, discussion with patient and family and coordination of care    Venancio Poisson, AGNP-BC  [facility] [address] [place], [place] [address]  Phone [phone] Fax [phone] Note: This document was prepared with digital dictation and possible smart phrase technology. Any transcriptional errors that result from this process are unintentional.

## 2018-10-15 ENCOUNTER — Ambulatory Visit: Payer: Medicare Other | Admitting: Adult Health

## 2018-10-15 ENCOUNTER — Encounter (HOSPITAL_COMMUNITY): Payer: Self-pay

## 2018-10-15 ENCOUNTER — Encounter: Payer: Self-pay | Admitting: Adult Health

## 2018-10-15 DIAGNOSIS — I5020 Unspecified systolic (congestive) heart failure: Secondary | ICD-10-CM | POA: Diagnosis not present

## 2018-10-15 DIAGNOSIS — M109 Gout, unspecified: Secondary | ICD-10-CM | POA: Diagnosis not present

## 2018-10-15 DIAGNOSIS — I10 Essential (primary) hypertension: Secondary | ICD-10-CM | POA: Diagnosis not present

## 2018-10-15 DIAGNOSIS — I429 Cardiomyopathy, unspecified: Secondary | ICD-10-CM | POA: Diagnosis not present

## 2018-10-15 DIAGNOSIS — I425 Other restrictive cardiomyopathy: Secondary | ICD-10-CM | POA: Diagnosis not present

## 2018-10-15 NOTE — Addendum Note (Signed)
Encounter addended by: Jorge Ny, LCSW on: 10/15/2018 7:27 AM  Actions taken: Visit Navigator Flowsheet section accepted

## 2018-10-16 ENCOUNTER — Telehealth: Payer: Self-pay

## 2018-10-16 NOTE — Telephone Encounter (Signed)
Patient no show for appt on 10/15/2018.

## 2018-10-27 ENCOUNTER — Telehealth (HOSPITAL_COMMUNITY): Payer: Self-pay | Admitting: Cardiology

## 2018-10-27 NOTE — Telephone Encounter (Signed)
Patient called to request update on critical illness forms left ~8 weeks ago Forms have been filled out and addressed by myself and Lillia Mountain, NP however additional information will need to be completed by Dr Haroldine Laws  Detailed message left for patient with the above information

## 2018-11-04 ENCOUNTER — Other Ambulatory Visit (HOSPITAL_COMMUNITY): Payer: Self-pay

## 2018-11-04 NOTE — Progress Notes (Signed)
Paramedicine Encounter    Patient ID: Charles Hawkins, male    DOB: February 05, 1971, 48 y.o.   MRN: [id number]   Patient Care Team: Benito Mccreedy, MD as PCP - General (Internal Medicine) Larey Dresser, MD as PCP - Advanced Heart Failure (Cardiology) Jorge Ny, LCSW as Social Worker (Licensed Clinical Social Worker)  Patient Active Problem List   Diagnosis Date Noted  . Hyponatremia 08/15/2018  . HCAP (healthcare-associated pneumonia) 08/15/2018  . Protein-calorie malnutrition, severe 08/15/2018  . Community acquired pneumonia   . Hypotension 08/14/2018  . HTN (hypertension) 05/20/2018  . Hyperlipemia 05/20/2018  . Tobacco use 05/20/2018  . Family hx-stroke 05/20/2018  . CHF (congestive heart failure) ([place]) 05/20/2018  . Restrictive cardiomyopathy ([place])   . 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 Atrial flutter ([place]) 12/06/2014  . Acute on chronic diastolic CHF (congestive heart failure) ([place]) 03/03/2014  . Palpitations 03/03/2014  . Acute gout 02/06/2014  . 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  atorvastatin (LIPITOR) 10 MG tablet, Take 1 tablet (10 mg total) by mouth daily at 6 PM., Disp: 30 tablet, Rfl: 5 .  ELIQUIS 5 MG TABS tablet, TAKE 1 TABLET (5 MG TOTAL) BY MOUTH 2 (TWO) TIMES DAILY., Disp: 60 tablet, Rfl: 5 .  feeding supplement, ENSURE ENLIVE, (ENSURE ENLIVE) LIQD, Take 237 mLs by mouth 3 (three) times daily between meals., Disp: , Rfl:  .  furosemide (LASIX) 40 MG tablet, Take 80 mg (2 tablets) in the morning & take 40 mg ( 1 tablet) in the evening (Patient taking differently: Take 80 mg (2 tablets) in the morning & take 40 mg ( 1 tablet) in the evening), Disp: 90 tablet, Rfl: 0 .  levothyroxine (SYNTHROID, LEVOTHROID) 25 MCG tablet, Take 25 mcg by mouth daily before breakfast. , Disp: , Rfl: 0 .  Multiple Vitamin (MULTIVITAMIN WITH MINERALS) TABS tablet, Take 1 tablet by mouth  daily. , Disp: , Rfl:  .  potassium chloride (MICRO-K) 10 MEQ CR capsule, Take 4 capsules (40 mEq total) by mouth daily., Disp: 120 capsule, Rfl: 3 .  spironolactone (ALDACTONE) 25 MG tablet, Take 0.5 tablets (12.5 mg total) by mouth at bedtime. (Patient taking differently: Take 12.5 mg by mouth at bedtime. ), Disp: 15 tablet, Rfl: 3 Allergies  Allergen Reactions  . Lyrica [Pregabalin] Anaphylaxis  . Nsaids Other (See Comments)      Social History   Socioeconomic History  . Marital status: Single    Spouse name: Not on file  . Number of children: 7  . Years of education: 78  . Highest education level: Not on file  Occupational History  . Not on file  Social Needs  . Financial resource strain: Not very hard  . Food insecurity:    Worry: Never true    Inability: Never true  . Transportation needs:    Medical: No    Non-medical: No  Tobacco Use  . Smoking status: Former Smoker    Packs/day: 0.25    Years: 20.00    Pack years: 5.00    Types: Cigarettes    Last attempt to quit: 06/30/2015    Years since quitting: 3.3  . Smokeless tobacco: Never Used  Substance and Sexual Activity  . Alcohol use: Yes    Alcohol/week: 1.0 standard drinks    Types: 1 Cans of beer per week    Comment: occasionally  . Drug use: Yes    Types: Marijuana  Comment: marijuana occasionally  . Sexual activity: Not on file  Lifestyle  . Physical activity:    Days per week: Not on file    Minutes per session: Not on file  . Stress: Not on file  Relationships  . Social connections:    Talks on phone: Not on file    Gets together: Not on file    Attends religious service: Not on file    Active member of club or organization: Not on file    Attends meetings of clubs or organizations: Not on file    Relationship status: Not on file  . Intimate partner violence:    Fear of current or ex partner: Not on file    Emotionally abused: Not on file    Physically abused: Not on file    Forced sexual  activity: Not on file  Other Topics Concern  . Not on file  Social History Narrative  . Not on file    Physical Exam      Future Appointments  Date Time Provider [place]  12/17/2018  9:40 AM Bensimhon, Shaune Pascal, MD [facility] None    BP 104/82   Pulse 86   Resp 15   Wt 143 lb (64.9 kg)   SpO2 98%   BMI 21.74 kg/m    Weight yesterday-143 Weight last visit-145 [facility]-136  Pt reports that he is feeling good, no complaints,  Pt is waiting back on some paperwork for his insurance documents to assist in the premiums.  Pt missed his 12/18 neuro appoint f/u--pt advised to call back to resch that. Pt denies any issues at this time. Pt denies any sob. He is able to walk around stores without sob but what constricts him is pain to his foot. He drinks ensures when he can and  Drinks the carnation instant breakfast, which does have a lot of protein and minerals in it.  Pt is doing well, he is managing his own meds, he will call to resch the neuro-he called dr patel office about going back to [facility]-he is awaiting phone call about that. He is managing things successfully and  appropriate to d/c from paramedicine. Offered services if anything changes in the future he can call back and we can get him back in.    Marylouise Stacks, [facility] Paramedic  11/04/18

## 2018-11-05 ENCOUNTER — Telehealth (HOSPITAL_COMMUNITY): Payer: Self-pay | Admitting: Surgery

## 2018-11-05 NOTE — Telephone Encounter (Signed)
Patient discharged from [facility] secondary to successful completion and independently managing HF at this time.  He is aware to contact the [facility] with any needs going forward.

## 2018-11-13 NOTE — Telephone Encounter (Signed)
Paperwork completed and given to patient, copy faxed and copy left to be scanned

## 2018-12-08 ENCOUNTER — Encounter: Payer: Self-pay | Admitting: Adult Health

## 2018-12-08 ENCOUNTER — Ambulatory Visit (INDEPENDENT_AMBULATORY_CARE_PROVIDER_SITE_OTHER): Payer: Medicare Other | Admitting: Adult Health

## 2018-12-08 VITALS — BP 125/84 | HR 78 | Ht 68.0 in | Wt 147.0 lb

## 2018-12-08 DIAGNOSIS — E785 Hyperlipidemia, unspecified: Secondary | ICD-10-CM | POA: Diagnosis not present

## 2018-12-08 DIAGNOSIS — I4892 Unspecified atrial flutter: Secondary | ICD-10-CM | POA: Diagnosis not present

## 2018-12-08 DIAGNOSIS — I10 Essential (primary) hypertension: Secondary | ICD-10-CM

## 2018-12-08 DIAGNOSIS — I63511 Cerebral infarction due to unspecified occlusion or stenosis of right middle cerebral artery: Secondary | ICD-10-CM | POA: Diagnosis not present

## 2018-12-08 NOTE — Patient Instructions (Signed)
Continue Eliquis (apixaban) daily  and atorvastatin (Lipitor) for secondary stroke prevention  Continue to follow up with PCP regarding cholesterol and blood pressure management   Follow up with cardiologist as scheduled along with scheduling appt with Dr. Posey Pronto   Schedule appt with vascular surgery - they will call you to schedule appointment   Continue to monitor blood pressure at home  Maintain strict control of hypertension with blood pressure goal below 130/90, diabetes with hemoglobin A1c goal below 6.5% and cholesterol with LDL cholesterol (bad cholesterol) goal below 70 mg/dL. I also advised the patient to eat a healthy diet with plenty of whole grains, cereals, fruits and vegetables, exercise regularly and maintain ideal body weight.  Followup in the future with me as needed or call earlier if needed       Thank you for coming to see Charles Hawkins at [facility]. I hope we have been able to provide you high quality care today.  You may receive a patient satisfaction survey over the next few weeks. We would appreciate your feedback and comments so that we may continue to improve ourselves and the health of our patients.

## 2018-12-08 NOTE — Progress Notes (Signed)
[facility] [address] [place]. [place] [address] [phone]       OFFICE FOLLOW UP NOTE  [name] Date of Birth:  04-04-1971 Medical Record Number:  [id number]   Reason for Referral:  hospital stroke follow up  CHIEF COMPLAINT:  Chief Complaint  Patient presents with  . Follow-up    Follow up for Stroke pt alone    HPI: 12/08/18  Mr Charles Hawkins is a 48 year old male with PMH of HTN, HLD, restrictive CMP, PAF, gout and arthritis who returns today for follow-up visit after embolic stroke in 0/9983.  He is unaccompanied at today's visit.  He continues to do well from a stroke standpoint without residual deficit or recurring of symptoms.  He continues on Eliquis without side effects of bleeding or bruising.  Continues on atorvastatin without side effects myalgias.  Blood pressure satisfactory today 125/84 which is normal as patient does monitor at home. He plans on scheduling appointment with Dr. Posey Pronto at [facility] for follow-up of possible transplant as he was not previously a candidate due to tobacco and THC use.  He denies current use of THC or tobacco.  He has not followed with vascular surgery as recommended at hospital discharge for unknown reasons. Denies new or worsening stroke/TIA symptoms.  Of note, he has been undergoing increased stress with family members with son having history of cardiac transplant and went to [place] over the weekend for funeral and was supposed to return home yesterday and apparently does not have a way to return home and does not have any additional medications with him including his cardiac rejection medications.  He also endorses a sister recently undergoing a procedure with patient stating "she almost died over the weekend".  He is tearful and appears to be extremely overwhelmed with his own medical care along with his family's.     HISTORY SUMMARY:  Charles Hawkins is being seen today for initial visit in the office for right frontal  cortical infarct embolic secondary to known atrial flutter with right M1 occlusion status post endovascular therapy on 05/18/2018. History obtained from patient and chart review. Reviewed all radiology images and labs personally.  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 No tPA d/t Eliquis use.  CT had reviewed and showed hypoattenuation in the insular ribbon with right MCA more dense than left.  CTA head and neck showed distal right M1 MCA occlusion and mild arthrosclerosis.  Cerebral angiogram was performed which showed occluded right M1 MCA and underwent endovascular therapy with TICI 3 reperfusion.  MRI head reviewed and showed right insular, right frontal operculum, punctate posterior right frontal cortical infarct along with old high right parietal cortical infarcts.  2D echo showing EF of 55 to 60% with LA moderately to severely dilated, RA severely dilated and persistent pulmonary artery hypertension and right ventricular dysfunction and increased right atrial pressure.  LDL 71 and recommended starting Lipitor 10 mg daily.  Patient does have a history of atrial flutter and was on Eliquis PTA.  Per notes, he did miss 1 dose and prior to his stroke therefore was recommended to continue Eliquis 5 mg twice daily.  Recommended for follow-up with Dr. Posey Pronto at [facility] cardiology as outpatient along with follow-up with Dr. Estanislado Pandy for follow-up of IR procedure patient was discharged home in  satisfactory condition.  Patient is being seen today for hospital follow-up.  Overall he has been doing well without residual deficits.  He continues to take the Eliquis without side effects of bleeding or bruising.   Continues to take Lipitor without side effects myalgias.  It was recommended for patient to follow-up with Dr. Posey Pronto at [facility] cardiology but it was recommended by Dr. Posey Pronto to follow-up with cardiologist in [place] and patient states follow-up appointment is made.  Blood pressure today 91/66 but this is normal for patient.  Patient has returned back to all previous activities.  Denies new or worsening stroke/TIA symptoms.   ROS:   14 system review of systems performed and negative with exception of leg swelling, joint pain, and numbness  PMH:  Past Medical History:  Diagnosis Date  . Arthritis   . CHF (congestive heart failure) ([place])   . HTN (hypertension)   . 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 Multiple areas of subendocardial enhancement on MRI. Fat pad and myocardial biopsy negative at [facility].   . Stroke [facility])     PSH:  Past Surgical History:  Procedure Laterality Date  . IR ANGIO VERTEBRAL SEL SUBCLAVIAN INNOMINATE UNI R MOD SED  05/18/2018  . IR CT HEAD LTD  05/18/2018  . IR PERCUTANEOUS ART THROMBECTOMY/INFUSION INTRACRANIAL INC DIAG ANGIO  05/18/2018  . RADIOLOGY WITH ANESTHESIA N/A 05/18/2018   Procedure: RADIOLOGY WITH ANESTHESIA;  Surgeon: Luanne Bras, MD;  Location: [facility];  Service: Radiology;  Laterality: N/A;    Social History:  Social History   Socioeconomic History  . Marital status: Single    Spouse name: Not on file  . Number of children: 7  . Years of education: 12  . Highest education level: Not on file  Occupational History  . Not on file  Social Needs  . Financial resource strain: Not very hard  . Food insecurity:    Worry: Never true    Inability: Never true  . Transportation needs:    Medical: No    Non-medical: No  Tobacco Use  . Smoking status: Former Smoker    Packs/day: 0.25    Years: 20.00    Pack years: 5.00    Types: Cigarettes    Last attempt to quit: 06/30/2015    Years since quitting: 3.4  . Smokeless tobacco: Never  Used  Substance and Sexual Activity  . Alcohol use: Yes    Alcohol/week: 1.0 standard drinks    Types: 1 Cans of beer per week    Comment: occasionally  . Drug use: Yes    Types: Marijuana    Comment: marijuana occasionally  . Sexual activity: Not on file  Lifestyle  . Physical activity:    Days per week: Not on file    Minutes per session: Not on file  . Stress: Not on file  Relationships  . Social connections:    Talks on phone: Not on file    Gets together: Not on file    Attends religious service: Not on file    Active member of club or organization: Not on file    Attends meetings of clubs or organizations: Not on file    Relationship status: Not on file  . Intimate partner violence:    Fear of current or ex partner: Not on file    Emotionally abused: Not on file    Physically abused: Not on file    Forced sexual activity: Not on file  Other Topics Concern  .  Not on file  Social History Narrative  . Not on file    Family History:  Family History  Problem Relation Age of Onset  . Heart failure Son 1       s/p heart transplant  . Stroke Brother 46       Deceased  . Stroke Mother   . Hypertension Father     Medications:   Current Outpatient Medications on File Prior to Visit  Medication Sig Dispense Refill  . atorvastatin (LIPITOR) 10 MG tablet Take 1 tablet (10 mg total) by mouth daily at 6 PM. 30 tablet 5  . ELIQUIS 5 MG TABS tablet TAKE 1 TABLET (5 MG TOTAL) BY MOUTH 2 (TWO) TIMES DAILY. 60 tablet 5  . feeding supplement, ENSURE ENLIVE, (ENSURE ENLIVE) LIQD Take 237 mLs by mouth 3 (three) times daily between meals.    . furosemide (LASIX) 40 MG tablet Take 80 mg (2 tablets) in the morning & take 40 mg ( 1 tablet) in the evening (Patient taking differently: Take 80 mg (2 tablets) in the morning & take 40 mg ( 1 tablet) in the evening) 90 tablet 0  . levothyroxine (SYNTHROID, LEVOTHROID) 25 MCG tablet Take 25 mcg by mouth daily before breakfast.   0  . Multiple  Vitamin (MULTIVITAMIN WITH MINERALS) TABS tablet Take 1 tablet by mouth daily.     . potassium chloride (MICRO-K) 10 MEQ CR capsule Take 4 capsules (40 mEq total) by mouth daily. 120 capsule 3  . spironolactone (ALDACTONE) 25 MG tablet Take 0.5 tablets (12.5 mg total) by mouth at bedtime. (Patient taking differently: Take 12.5 mg by mouth at bedtime. ) 15 tablet 3   No current facility-administered medications on file prior to visit.     Allergies:   Allergies  Allergen Reactions  . Lyrica [Pregabalin] Anaphylaxis  . Nsaids Other (See Comments)     Physical Exam  Vitals:   12/08/18 0837  BP: 125/84  Pulse: 78  Weight: 147 lb (66.7 kg)  Height: 5\' 8"  (1.727 m)   Body mass index is 22.35 kg/m. No exam data present  General: well developed, well nourished, pleasant middle-aged African-American male, seated, in no evident distress Head: head normocephalic and atraumatic.   Neck: supple with no carotid or supraclavicular bruits Cardiovascular: irregular rate and rhythm, no murmurs Musculoskeletal: no deformity Skin:  no rash/petichiae Vascular:  Normal pulses all extremities  Neurologic Exam Mental Status: Awake and fully alert. Oriented to place and time. Recent and remote memory intact. Attention span, concentration and fund of knowledge appropriate. Mood and affect appropriate.  Cranial Nerves: Pupils equal, briskly reactive to light. Extraocular movements full without nystagmus. Visual fields full to confrontation. Hearing intact. Facial sensation intact. Face, tongue, palate moves normally and symmetrically.  Motor: Normal bulk and tone. Normal strength in all tested extremity muscles. Sensory.: intact to touch , pinprick , position and vibratory sensation.  Coordination: Rapid alternating movements normal in all extremities. Finger-to-nose and heel-to-shin performed accurately bilaterally. Gait and Station: Arises from chair without difficulty. Stance is normal. Gait  demonstrates normal stride length and balance . Able to heel, toe and tandem walk without difficulty.  Reflexes: 1+ and symmetric. Toes downgoing.     Diagnostic Data (Labs, Imaging, Testing)  CT HEAD WO CONTRAST CT ANGIO HEAD W OR WO CONTRAST CT ANGIO NECK W OR WO CONTRAST 05/18/2018 IMPRESSION: 1. Somewhat reduced quality exam demonstrating equivocal hypoattenuation of the RIGHT insular ribbon is compared to the LEFT. RIGHT MCA may  be questionably more dense than LEFT, on multiplanar reformations. 2. ASPECTS is 9.  MR BRAIN WO CONTRAST 05/19/18 IMPRESSION: 1. Mildly increased diffusion abnormality within the right insula and right frontal operculum without frank restriction, consistent with mild ischemic insult. Additional punctate cortical infarct more superiorly within the posterior right frontal region. No other evidence for acute ischemia. 2. 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 3. Otherwise negative brain MRI.  CEREBRAL ANGIOGRAM 05/18/2018 IMPRESSION: Status post endovascular complete revascularization of occluded right middle cerebral artery M1 segment with 1 pass with the 5 mm x 33 mm Embotrap retrieval device achieving a TICI 3 revascularization.  ECHOCARDIOGRAM 05/19/2018 Study Conclusions - Left ventricle: The cavity size was normal. There was moderate   concentric hypertrophy. Systolic function was normal. The   estimated ejection fraction was in the range of 55% to 60%. Wall   motion was normal; there were no regional wall motion   abnormalities. - Mitral valve: Mild, late systolicprolapse, involving the anterior   leaflet and the posterior leaflet. There was mild to moderate   regurgitation directed centrally. - Left atrium: The atrium was moderately to severely dilated. - Right ventricle: Systolic function was moderately reduced. - Right atrium: The atrium was severely dilated. - Pulmonary arteries: Systolic pressure was  moderately increased.   PA peak pressure: 68 mm Hg (S).   ASSESSMENT: Charles Hawkins is a 48 y.o. year old male here with right insular, right frontal perculum, and punctate posterior right frontal cortical infarct on 05/18/2018 secondary to known AF with right M1 occlusion s/p endovascular therapy with TICI 3. Vascular risk factors include restrictive cardiomyopathy on Eliquis, HTN and HLD.  He is being seen today for follow-up visit and overall continues to do well from a stroke standpoint without residual deficits or recurring symptoms.    PLAN: -Continue Eliquis (apixaban) daily  and lipitor  for secondary stroke prevention -F/u with PCP regarding your HLD and HTN management -f/u with cardiologist as scheduled for ongoing cardiac conditions and management along with scheduling appointment with Dr. P[facility]y Pronto at [facility] to follow-up with vascular surgery Dr. Estanislado Pandy as recommended at hospital discharge -advised patient that I will reach out to his office in regards to further assistance in him being scheduled -continue to monitor BP at home -Congratulated on smoking cessation and strongly encouraged continuation -advised to continue to stay active and maintain a healthy diet -Maintain strict control of hypertension with blood pressure goal below 130/90, diabetes with hemoglobin A1c goal below 6.5% and cholesterol with LDL cholesterol (bad cholesterol) goal below 70 mg/dL. I also advised the patient to eat a healthy diet with plenty of whole grains, cereals, fruits and vegetables, exercise regularly and maintain ideal body weight.  As patient stable from stroke standpoint and has continued monitoring by PCP and cardiology along with obtaining follow-up with vascular surgery, it was recommended at this time to follow-up as needed highly encouraged to call office with questions, concerns or need of future follow-up appointment.   Greater than 50% of time during this 25 minute visit was spent  on counseling,explanation of diagnosis of cortical infarcts, reviewing risk factor management of HTN, HLD and atrial flutter, planning of further management, discussion with patient and family and coordination of care    Venancio Poisson, AGNP-BC  [facility] [address] [place], [place] [address]  Phone [phone] Fax [phone] Note: This document was prepared with digital dictation and possible smart phrase technology. Any transcriptional errors that  result from this process are unintentional.

## 2018-12-10 NOTE — Progress Notes (Signed)
I agree with the above plan 

## 2018-12-11 ENCOUNTER — Telehealth (HOSPITAL_COMMUNITY): Payer: Self-pay

## 2018-12-11 NOTE — Telephone Encounter (Signed)
Disability paperwork faxed to [facility].[phone]. Confirmation of receipt received.

## 2018-12-17 ENCOUNTER — Ambulatory Visit (HOSPITAL_COMMUNITY)
Admission: RE | Admit: 2018-12-17 | Discharge: 2018-12-17 | Disposition: A | Discharge status: Home / Self Care | Payer: Medicare Other | Source: Ambulatory Visit | Attending: Internal Medicine | Admitting: Internal Medicine

## 2018-12-17 ENCOUNTER — Encounter (HOSPITAL_COMMUNITY): Payer: Self-pay | Admitting: Internal Medicine

## 2018-12-17 VITALS — BP 110/76 | HR 73 | Wt 147.8 lb

## 2018-12-17 DIAGNOSIS — I4821 Permanent atrial fibrillation: Secondary | ICD-10-CM

## 2018-12-17 DIAGNOSIS — I4891 Unspecified atrial fibrillation: Secondary | ICD-10-CM | POA: Diagnosis not present

## 2018-12-17 DIAGNOSIS — R9431 Abnormal electrocardiogram [ECG] [EKG]: Secondary | ICD-10-CM | POA: Diagnosis not present

## 2018-12-17 DIAGNOSIS — I509 Heart failure, unspecified: Secondary | ICD-10-CM

## 2018-12-17 DIAGNOSIS — E039 Hypothyroidism, unspecified: Secondary | ICD-10-CM | POA: Insufficient documentation

## 2018-12-17 DIAGNOSIS — I4892 Unspecified atrial flutter: Secondary | ICD-10-CM | POA: Insufficient documentation

## 2018-12-17 DIAGNOSIS — I110 Hypertensive heart disease with heart failure: Secondary | ICD-10-CM | POA: Insufficient documentation

## 2018-12-17 DIAGNOSIS — Z79890 Hormone replacement therapy (postmenopausal): Secondary | ICD-10-CM | POA: Diagnosis not present

## 2018-12-17 DIAGNOSIS — Z8673 Personal history of transient ischemic attack (TIA), and cerebral infarction without residual deficits: Secondary | ICD-10-CM | POA: Diagnosis not present

## 2018-12-17 DIAGNOSIS — I5032 Chronic diastolic (congestive) heart failure: Secondary | ICD-10-CM | POA: Insufficient documentation

## 2018-12-17 DIAGNOSIS — Z79899 Other long term (current) drug therapy: Secondary | ICD-10-CM | POA: Insufficient documentation

## 2018-12-17 DIAGNOSIS — Z7901 Long term (current) use of anticoagulants: Secondary | ICD-10-CM | POA: Diagnosis not present

## 2018-12-17 DIAGNOSIS — I425 Other restrictive cardiomyopathy: Secondary | ICD-10-CM | POA: Diagnosis not present

## 2018-12-17 DIAGNOSIS — I493 Ventricular premature depolarization: Secondary | ICD-10-CM | POA: Insufficient documentation

## 2018-12-17 DIAGNOSIS — Z8249 Family history of ischemic heart disease and other diseases of the circulatory system: Secondary | ICD-10-CM | POA: Insufficient documentation

## 2018-12-17 LAB — COMPREHENSIVE METABOLIC PANEL
ALT: 28 U/L (ref 0–44)
AST: 46 U/L — ABNORMAL HIGH (ref 15–41)
Albumin: 3.3 g/dL — ABNORMAL LOW (ref 3.5–5.0)
Alkaline Phosphatase: 192 U/L — ABNORMAL HIGH (ref 38–126)
Anion gap: 11 (ref 5–15)
BUN: 10 mg/dL (ref 6–20)
CO2: 24 mmol/L (ref 22–32)
Calcium: 8.9 mg/dL (ref 8.9–10.3)
Chloride: 97 mmol/L — ABNORMAL LOW (ref 98–111)
Creatinine, Ser: 0.91 mg/dL (ref 0.61–1.24)
GFR calc Af Amer: >60 mL/min (ref >60)
GFR calc non Af Amer: >60 mL/min (ref >60)
Glucose, Bld: 90 mg/dL (ref 70–99)
Potassium: 4.4 mmol/L (ref 3.5–5.1)
Sodium: 132 mmol/L — ABNORMAL LOW (ref 135–145)
Total Bilirubin: 1.9 mg/dL — ABNORMAL HIGH (ref 0.3–1.2)
Total Protein: 7.4 g/dL (ref 6.5–8.1)

## 2018-12-17 LAB — CBC
HCT: 46.3 % (ref 39.0–52.0)
Hemoglobin: 14.8 g/dL (ref 13.0–17.0)
MCH: 28.7 pg (ref 26.0–34.0)
MCHC: 32 g/dL (ref 30.0–36.0)
MCV: 89.7 fL (ref 80.0–100.0)
Platelets: 173 10*3/uL (ref 150–400)
RBC: 5.16 MIL/uL (ref 4.22–5.81)
RDW: 14.1 % (ref 11.5–15.5)
WBC: 4.3 10*3/uL (ref 4.0–10.5)
nRBC: 0 % (ref 0.0–0.2)

## 2018-12-17 LAB — LIPID PANEL
Cholesterol: 104 mg/dL (ref 0–200)
HDL: 45 mg/dL (ref >40)
LDL Cholesterol: 48 mg/dL (ref 0–99)
Total CHOL/HDL Ratio: 2.3 RATIO
Triglycerides: 56 mg/dL (ref <150)
VLDL: 11 mg/dL (ref 0–40)

## 2018-12-17 MED ORDER — ATORVASTATIN CALCIUM 20 MG PO TABS: 20.0000 mg | ORAL_TABLET | Freq: Every day | ORAL | 6 refills | Status: DC

## 2018-12-17 NOTE — Patient Instructions (Signed)
Labs were done today. We will call you with any ABNORMAL results. No news is good news!  EKG was completed.   INCREASE Atorvastatin to 20mg  (1 tab) every night, this prescription has been sent to your pharmacy.  You have been referred to Physical Therapy. This office will call you to schedule an appointment with you.  You have been referred to complete a Cardiac MRI, they will call you to schedule an appointment with you.  Your physician wants you to follow-up in: 6 MONTHS You will receive a reminder letter in the mail two months in advance. If you don't receive a letter, please call our office to schedule the follow-up appointment.

## 2018-12-17 NOTE — Progress Notes (Signed)
Patient ID: Charles Hawkins, male   DOB: [birth year], 48 y.o.   MRN: [id number]  ADVANCED HF CLINIC CONSULT NOTE  HF: Dr Charles Hawkins   HPI: Charles Hawkins is a 48 y.o. male with h/o HTN, restrictive CMP, Afib, h/o bilateral club feet s/p surgical repair, gout, arthritis, h/o CVA on Eliquis, and chronic pain.   Son had heart transplant at 12 age 54 months old due to reported infiltrative CM (exact diagnosis not reported in records I have). Apparently after that, [name] underwent cardiac workup and cMRI suggested infiltrative CM with normal EF. Has had exhaustive w/u since that time under the guidance of Dr. Loyal Hawkins at [facility] without clear diagnosis. 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 Wife got job in [employer] with [place] and [place]. He denies FHx of HF except for his son  Saw Dr. Amalia Hawkins in hematology and Charles Hawkins in Rheumatology and he was apparently told he did not have a systemic problem at that time.   Seen for Acute CVA in 05/19/18. He had only missed one dose of Eliquis as outpatient. Had mild left facial droop as only persistent deficit. Was supposed to follow up with Dr Charles Hawkins at [facility] in the transplant center, but has moved to [facility] so never did.   Admitted 10/17-10/22/19 with pneumonia and hypotension. He was treated with IV abx, then transitioned to Augmentin. HF team consulted for volume overload. He was diuresed with IV lasix, then transitioned to lasix 80 mg am, 40 mg pm. DC weight: 146 lbs.  He returns today for HF follow up. Recently seen by Neuro and recommended continuation of Eliquis and statin for secondary stroke prevention. Says he feels good. Has some arthritis pain in feet and back. Denies CP, SOB, orthopnea or PND. Can do all ADLs without problem. Gets tired if he walks too far. Still struggling with his balance post CVA. Has not done rehab. No tobacco. Using a little THC for appetite.     Studies:  - cMRI - LVEF 63% moderate concentric LVH  1.6 cm. RV normal. Biatrial enlargement, Diffuse infiltrative process involving both ventricles - EM biopsy 11/14 - negative - Fat pad biopsy - neagtive - Serologies all normal - Had ETT in 9/14 (no CPX). 7.6 METs HR 76-156 - Event monitor 5/15; SR with PVCs - Repeat cMRI in 5/15      1. Normal LV size and systolic function, EF 46%. Mild LV     hypertrophy. Normal RV size and systolic function.    2. Moderate to severe biatrial enlargement.    3. Evidence for diffuse infiltrative process by delayed enhancement     imaging, possible cardiac amyloidosis. - Had CPX 4/15 which showed significant cardiac limitation  Resting HR: 75 Peak HR: 130 (72% age predicted max HR) BP rest: 102/58 BP peak: 128/62 Peak VO2: 13.9 (38.8% predicted peak VO2) VE/VCO2 slope: 41.4 OUES: 1.09 Peak RER: 1.13 Ventilatory Threshold: 10.2 (28.5% predicted peak VO2) Peak RR 32 Peak Ventilation: 46.3 VE/MVV: 32.2% PETCO2 at peak: 26 O2pulse: 8 (57% predicted O2pulse) - Echo (2/16) with EF 35-40%, thick LV and RV with starry night pattern, moderately hypokinetic RV, severe biatrial enlargement.   Event monitor showing multiple runs of atrial tach/flutter 150-160s. He was started on amiodarone and apixaban.  Labs (2/16): K 4.2, creatinine 0.89, AST 56, ALT 28, tbili 2.1, HCT 41.1 Labs (5/16): K 3.7, creatinine 0.93, TSH 8.357, BNP 2282 => 1242, AST 52 => 62, ALT 26 => 41,  tbili 2.3, HCT 42.8 Labs (04/13/15): K 4.0, Creatinine 1.05  Review of systems complete and found to be negative unless listed in HPI.   Current Outpatient Medications on File Prior to Encounter  Medication Sig Dispense Refill  . atorvastatin (LIPITOR) 10 MG tablet Take 1 tablet (10 mg total) by mouth daily at 6 PM. 30 tablet 5  . ELIQUIS 5 MG TABS tablet TAKE 1 TABLET (5 MG TOTAL) BY MOUTH 2 (TWO) TIMES DAILY. 60 tablet 5  . feeding supplement, ENSURE ENLIVE, (ENSURE ENLIVE) LIQD Take 237 mLs by mouth 3 (three) times daily between meals.     . furosemide (LASIX) 40 MG tablet Take 80 mg (2 tablets) in the morning & take 40 mg ( 1 tablet) in the evening (Patient taking differently: Take 80 mg (2 tablets) in the morning & take 40 mg ( 1 tablet) in the evening) 90 tablet 0  . levothyroxine (SYNTHROID, LEVOTHROID) 25 MCG tablet Take 25 mcg by mouth daily before breakfast.   0  . Multiple Vitamin (MULTIVITAMIN WITH MINERALS) TABS tablet Take 1 tablet by mouth daily.     . potassium chloride (MICRO-K) 10 MEQ CR capsule Take 4 capsules (40 mEq total) by mouth daily. 120 capsule 3  . spironolactone (ALDACTONE) 25 MG tablet Take 0.5 tablets (12.5 mg total) by mouth at bedtime. (Patient taking differently: Take 12.5 mg by mouth at bedtime. ) 15 tablet 3   No current facility-administered medications on file prior to encounter.     PHYSICAL EXAM: Vitals:   12/17/18 0932  BP: 110/76  Pulse: 73  SpO2: 94%  Weight: 67 kg (147 lb 12.8 oz)   Wt Readings from Last 3 Encounters:  12/17/18 67 kg (147 lb 12.8 oz)  12/08/18 66.7 kg (147 lb)  11/04/18 64.9 kg (143 lb)   General:  Well appearing. No resp difficulty HEENT: normal Neck: supple. no JVD. Carotids 2+ bilat; no bruits. No lymphadenopathy or thryomegaly appreciated. Cor: PMI nondisplaced. Irregular rate & rhythm. +s4 No rubs, gallops or murmurs. Lungs: clear Abdomen: soft, nontender, nondistended. No hepatosplenomegaly. No bruits or masses. Good bowel sounds. Extremities: no cyanosis, clubbing, rash, edema Neuro: alert & orientedx3, cranial nerves grossly intact. moves all 4 extremities w/o difficulty. Affect pleasant  ECG: AF 76 low volts. No PVC. Personally reviewed   ASSESSMENT & PLAN:  1. 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  -cMRI 5/15Normal LV size and systolic function, EF 27%. Mild LV hypertrophy. Normal RV  size and systolic function,moderate to severe biatrial enlargement,Evidence for diffuse infiltrative process by delayed enhancementimaging, possible cardiac amyloidosis. - EM biopsy 2/14. Nonspecific myocardial degeneration. Amyloid -Followed by [facility] for possible transplant. He has had transplant evaluation and was not listed due to tobacco and THC use. -Last seen 11/2017 by [facility]. NYHA II at that time counseled to stop THC use and f/u yearly.  - Echo 05/19/18 EF 55-60%. - NYHA II-III, difficult to assess with decreased activity. Will refer to PT as he seems limited by post CVA imbalance - Volume status stable on exam. - Continue lasix 80 mg am, 40 mg pm. Okay to take extra 40 mg PRN.   - Continue spiro 12.5 mg qHS.  - Previously onentrestobut stoppeddue to intolerance. - Has previously refused coreg. Was supposed to see Dr. Posey Hawkins in f/u and discuss. - Repeat cMRI - Encouraged  him to keep regular f/u with [facility]   2.CVA7/2019 -He underwent emergent CTA7/2045found a right M1 MCA occlusion consistent with an embolic stroke.Underwentendovascular revascularization of the rightMCAwith x 1 pass with 31mm x 14mm embotrap retrieve device achieving a TICI 3reperfusion. - Continue Eliquis and statin. Denies bleeding.  - Will increase atorva to 20. Check lipids.  - Refer to PT for functional eval   3. A fibrillation/Flutter - Remains in Afib/Flutter. Rate controlled -Amiodaronepreviouslystopped by his PCP due to low BP and hypothyroidism. - Has previously been inA flutterand discussedA flutter ablationbut did not go to appt. - Continue eliquis 5 mg BID. Denies bleeding.   4. Frequent PVCs - Previously taken off amio for afib due to low BP and hypothyroidism. No PVCs on ECG today    Glori Bickers MD 12/17/2018  Greater than 50% of the 25 minute visit was spent in counseling/coordination of care regarding disease state education, salt/fluid  restriction, sliding scale diuretics, and medication compliance.

## 2018-12-24 ENCOUNTER — Other Ambulatory Visit (HOSPITAL_COMMUNITY): Payer: Self-pay | Admitting: Internal Medicine

## 2019-01-09 ENCOUNTER — Telehealth (HOSPITAL_COMMUNITY): Payer: Self-pay | Admitting: Emergency Medicine

## 2019-01-09 NOTE — Telephone Encounter (Signed)
Reaching out to patient to offer assistance regarding upcoming cardiac imaging study; pt verbalizes understanding of appt date/time, parking situation and where to check in, and verified current allergies; name and call back number provided for further questions should they arise Shantera Monts RN Navigator Cardiac Imaging [facility] [phone] office [phone] cell 

## 2019-01-12 ENCOUNTER — Ambulatory Visit (HOSPITAL_COMMUNITY): Admission: RE | Admit: 2019-01-12 | Payer: Medicare Other | Source: Ambulatory Visit

## 2019-02-11 ENCOUNTER — Other Ambulatory Visit (HOSPITAL_COMMUNITY): Payer: Self-pay | Admitting: Cardiology

## 2019-02-11 ENCOUNTER — Telehealth (HOSPITAL_COMMUNITY): Payer: Self-pay | Admitting: Licensed Clinical Social Worker

## 2019-02-11 MED ORDER — FUROSEMIDE 80 MG PO TABS: ORAL_TABLET | ORAL | 3 refills | Status: DC

## 2019-02-11 NOTE — Telephone Encounter (Signed)
CSW contacted patient to check and make sure of adequate food and medications. Patient denies any concerns currently and appreciative of the call. CSW reminded of the importance to stay home and social distance when possible. CSW provided support and encouraged patient to return call if needed. Raquel Sarna, [place], [place]

## 2019-02-25 ENCOUNTER — Other Ambulatory Visit (HOSPITAL_COMMUNITY): Payer: Self-pay

## 2019-02-25 MED ORDER — SPIRONOLACTONE 25 MG PO TABS: 12.5000 mg | ORAL_TABLET | Freq: Every day | ORAL | 6 refills | Status: DC

## 2019-03-19 DIAGNOSIS — I5020 Unspecified systolic (congestive) heart failure: Secondary | ICD-10-CM | POA: Diagnosis not present

## 2019-03-19 DIAGNOSIS — I10 Essential (primary) hypertension: Secondary | ICD-10-CM | POA: Diagnosis not present

## 2019-03-19 DIAGNOSIS — Z131 Encounter for screening for diabetes mellitus: Secondary | ICD-10-CM | POA: Diagnosis not present

## 2019-03-19 DIAGNOSIS — H538 Other visual disturbances: Secondary | ICD-10-CM | POA: Diagnosis not present

## 2019-03-19 DIAGNOSIS — M109 Gout, unspecified: Secondary | ICD-10-CM | POA: Diagnosis not present

## 2019-03-19 DIAGNOSIS — Z1329 Encounter for screening for other suspected endocrine disorder: Secondary | ICD-10-CM | POA: Diagnosis not present

## 2019-03-19 DIAGNOSIS — I429 Cardiomyopathy, unspecified: Secondary | ICD-10-CM | POA: Diagnosis not present

## 2019-03-19 DIAGNOSIS — Z01021 Encounter for examination of eyes and vision following failed vision screening with abnormal findings: Secondary | ICD-10-CM | POA: Diagnosis not present

## 2019-03-19 DIAGNOSIS — I425 Other restrictive cardiomyopathy: Secondary | ICD-10-CM | POA: Diagnosis not present

## 2019-03-19 DIAGNOSIS — R062 Wheezing: Secondary | ICD-10-CM | POA: Diagnosis not present

## 2019-03-31 DIAGNOSIS — I429 Cardiomyopathy, unspecified: Secondary | ICD-10-CM | POA: Diagnosis not present

## 2019-03-31 DIAGNOSIS — Z0001 Encounter for general adult medical examination with abnormal findings: Secondary | ICD-10-CM | POA: Diagnosis not present

## 2019-03-31 DIAGNOSIS — I425 Other restrictive cardiomyopathy: Secondary | ICD-10-CM | POA: Diagnosis not present

## 2019-03-31 DIAGNOSIS — M109 Gout, unspecified: Secondary | ICD-10-CM | POA: Diagnosis not present

## 2019-03-31 DIAGNOSIS — R740 Nonspecific elevation of levels of transaminase and lactic acid dehydrogenase [LDH]: Secondary | ICD-10-CM | POA: Diagnosis not present

## 2019-03-31 DIAGNOSIS — I5020 Unspecified systolic (congestive) heart failure: Secondary | ICD-10-CM | POA: Diagnosis not present

## 2019-03-31 DIAGNOSIS — I10 Essential (primary) hypertension: Secondary | ICD-10-CM | POA: Diagnosis not present

## 2019-05-08 ENCOUNTER — Telehealth (HOSPITAL_COMMUNITY): Payer: Self-pay | Admitting: Licensed Clinical Social Worker

## 2019-05-08 NOTE — Telephone Encounter (Signed)
CSW contacted patient to follow up on [facility] and supportive needs. Message left for return call. Jackie Juventino Pavone, LCSW, CCSW-MCS [phone] 

## 2019-05-10 ENCOUNTER — Other Ambulatory Visit (HOSPITAL_COMMUNITY): Payer: Self-pay | Admitting: Internal Medicine

## 2019-06-02 DIAGNOSIS — Z1159 Encounter for screening for other viral diseases: Secondary | ICD-10-CM | POA: Diagnosis not present

## 2019-06-02 DIAGNOSIS — I425 Other restrictive cardiomyopathy: Secondary | ICD-10-CM | POA: Diagnosis not present

## 2019-06-02 DIAGNOSIS — Z8673 Personal history of transient ischemic attack (TIA), and cerebral infarction without residual deficits: Secondary | ICD-10-CM | POA: Diagnosis not present

## 2019-06-02 DIAGNOSIS — Z87891 Personal history of nicotine dependence: Secondary | ICD-10-CM | POA: Diagnosis not present

## 2019-06-02 DIAGNOSIS — M1990 Unspecified osteoarthritis, unspecified site: Secondary | ICD-10-CM | POA: Diagnosis not present

## 2019-06-02 DIAGNOSIS — Z79899 Other long term (current) drug therapy: Secondary | ICD-10-CM | POA: Diagnosis not present

## 2019-06-02 DIAGNOSIS — Z791 Long term (current) use of non-steroidal anti-inflammatories (NSAID): Secondary | ICD-10-CM | POA: Diagnosis not present

## 2019-06-02 DIAGNOSIS — E039 Hypothyroidism, unspecified: Secondary | ICD-10-CM | POA: Diagnosis not present

## 2019-06-02 DIAGNOSIS — I43 Cardiomyopathy in diseases classified elsewhere: Secondary | ICD-10-CM | POA: Diagnosis not present

## 2019-06-02 DIAGNOSIS — I4891 Unspecified atrial fibrillation: Secondary | ICD-10-CM | POA: Diagnosis not present

## 2019-06-02 DIAGNOSIS — I10 Essential (primary) hypertension: Secondary | ICD-10-CM | POA: Diagnosis not present

## 2019-06-02 DIAGNOSIS — E854 Organ-limited amyloidosis: Secondary | ICD-10-CM | POA: Diagnosis not present

## 2019-06-02 DIAGNOSIS — R945 Abnormal results of liver function studies: Secondary | ICD-10-CM | POA: Diagnosis not present

## 2019-06-02 DIAGNOSIS — R748 Abnormal levels of other serum enzymes: Secondary | ICD-10-CM | POA: Diagnosis not present

## 2019-06-02 DIAGNOSIS — Z886 Allergy status to analgesic agent status: Secondary | ICD-10-CM | POA: Diagnosis not present

## 2019-06-02 DIAGNOSIS — Z7901 Long term (current) use of anticoagulants: Secondary | ICD-10-CM | POA: Diagnosis not present

## 2019-06-02 DIAGNOSIS — Z823 Family history of stroke: Secondary | ICD-10-CM | POA: Diagnosis not present

## 2019-06-02 DIAGNOSIS — Z8249 Family history of ischemic heart disease and other diseases of the circulatory system: Secondary | ICD-10-CM | POA: Diagnosis not present

## 2019-06-02 DIAGNOSIS — E119 Type 2 diabetes mellitus without complications: Secondary | ICD-10-CM | POA: Diagnosis not present

## 2019-06-02 DIAGNOSIS — N529 Male erectile dysfunction, unspecified: Secondary | ICD-10-CM | POA: Diagnosis not present

## 2019-06-17 ENCOUNTER — Telehealth (HOSPITAL_COMMUNITY): Payer: Self-pay | Admitting: Licensed Clinical Social Worker

## 2019-06-17 NOTE — Telephone Encounter (Signed)
CSW contacted patient to follow up and offer support due to Covid-19 and inability to meet with [facility]. Message left for return call if needed. Jackie Keyonta Barradas, LCSW, CCSW-MCS [phone] 

## 2019-06-18 DIAGNOSIS — K769 Liver disease, unspecified: Secondary | ICD-10-CM | POA: Diagnosis not present

## 2019-06-18 DIAGNOSIS — R945 Abnormal results of liver function studies: Secondary | ICD-10-CM | POA: Diagnosis not present

## 2019-06-18 DIAGNOSIS — R748 Abnormal levels of other serum enzymes: Secondary | ICD-10-CM | POA: Diagnosis not present

## 2019-06-18 DIAGNOSIS — Z1159 Encounter for screening for other viral diseases: Secondary | ICD-10-CM | POA: Diagnosis not present

## 2019-06-18 DIAGNOSIS — R93422 Abnormal radiologic findings on diagnostic imaging of left kidney: Secondary | ICD-10-CM | POA: Diagnosis not present

## 2019-06-19 ENCOUNTER — Other Ambulatory Visit (HOSPITAL_COMMUNITY): Payer: Self-pay | Admitting: Internal Medicine

## 2019-06-19 MED ORDER — ATORVASTATIN CALCIUM 20 MG PO TABS: ORAL_TABLET | ORAL | 2 refills | Status: DC

## 2019-07-07 DIAGNOSIS — R748 Abnormal levels of other serum enzymes: Secondary | ICD-10-CM | POA: Diagnosis not present

## 2019-07-07 DIAGNOSIS — K7469 Other cirrhosis of liver: Secondary | ICD-10-CM | POA: Diagnosis not present

## 2019-07-07 DIAGNOSIS — Z79899 Other long term (current) drug therapy: Secondary | ICD-10-CM | POA: Diagnosis not present

## 2019-07-07 DIAGNOSIS — K7689 Other specified diseases of liver: Secondary | ICD-10-CM | POA: Diagnosis not present

## 2019-07-07 DIAGNOSIS — Z87891 Personal history of nicotine dependence: Secondary | ICD-10-CM | POA: Diagnosis not present

## 2019-09-08 DIAGNOSIS — M109 Gout, unspecified: Secondary | ICD-10-CM | POA: Diagnosis not present

## 2019-09-08 DIAGNOSIS — Z8673 Personal history of transient ischemic attack (TIA), and cerebral infarction without residual deficits: Secondary | ICD-10-CM | POA: Diagnosis not present

## 2019-09-08 DIAGNOSIS — F329 Major depressive disorder, single episode, unspecified: Secondary | ICD-10-CM | POA: Diagnosis not present

## 2019-09-08 DIAGNOSIS — Z8776 Personal history of (corrected) congenital malformations of integument, limbs and musculoskeletal system: Secondary | ICD-10-CM | POA: Diagnosis not present

## 2019-09-08 DIAGNOSIS — E119 Type 2 diabetes mellitus without complications: Secondary | ICD-10-CM | POA: Diagnosis not present

## 2019-09-08 DIAGNOSIS — G8929 Other chronic pain: Secondary | ICD-10-CM | POA: Diagnosis not present

## 2019-09-08 DIAGNOSIS — K7469 Other cirrhosis of liver: Secondary | ICD-10-CM | POA: Diagnosis not present

## 2019-09-08 DIAGNOSIS — I10 Essential (primary) hypertension: Secondary | ICD-10-CM | POA: Diagnosis not present

## 2019-09-08 DIAGNOSIS — I425 Other restrictive cardiomyopathy: Secondary | ICD-10-CM | POA: Diagnosis not present

## 2019-09-08 DIAGNOSIS — Z7901 Long term (current) use of anticoagulants: Secondary | ICD-10-CM | POA: Diagnosis not present

## 2019-09-08 DIAGNOSIS — Z23 Encounter for immunization: Secondary | ICD-10-CM | POA: Diagnosis not present

## 2019-09-08 DIAGNOSIS — I4891 Unspecified atrial fibrillation: Secondary | ICD-10-CM | POA: Diagnosis not present

## 2019-09-08 DIAGNOSIS — Z79899 Other long term (current) drug therapy: Secondary | ICD-10-CM | POA: Diagnosis not present

## 2019-09-18 DIAGNOSIS — M8588 Other specified disorders of bone density and structure, other site: Secondary | ICD-10-CM | POA: Diagnosis not present

## 2019-09-18 DIAGNOSIS — K7460 Unspecified cirrhosis of liver: Secondary | ICD-10-CM | POA: Diagnosis not present

## 2019-09-18 DIAGNOSIS — M8589 Other specified disorders of bone density and structure, multiple sites: Secondary | ICD-10-CM | POA: Diagnosis not present

## 2019-11-03 IMAGING — CT CT HEAD CODE STROKE
3 series · 15 of 47 positions shown, 18 images · non-contrast
Comparison: None.

CLINICAL DATA: Code stroke.  LEFT-sided weakness.  Aphasia.

EXAM:
CT HEAD WITHOUT CONTRAST
TECHNIQUE: Contiguous axial images were obtained from the base of the skull
through the vertex without intravenous contrast.

[Series 3: head 5.0 st · axial · 0.42mm/px · z∈[-151,-21]mm · 9 of 32 slices shown, 12 images]
[im 3/32  brain]
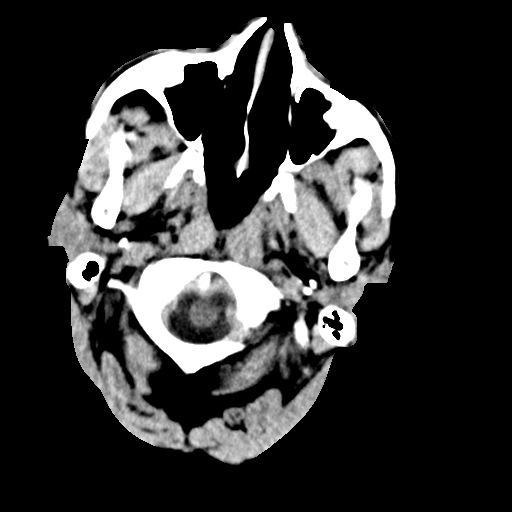
[im 3/32  bone]
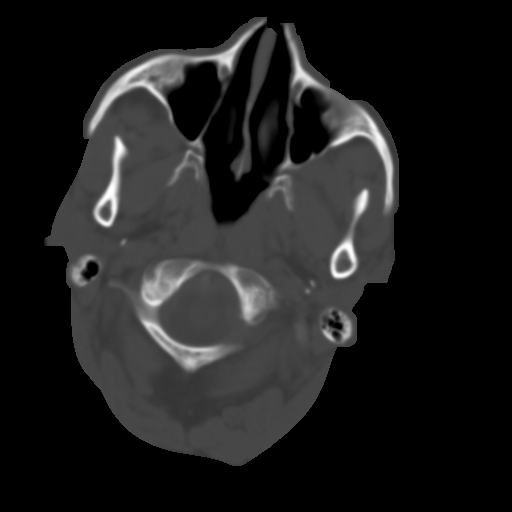
[im 6/32  brain]
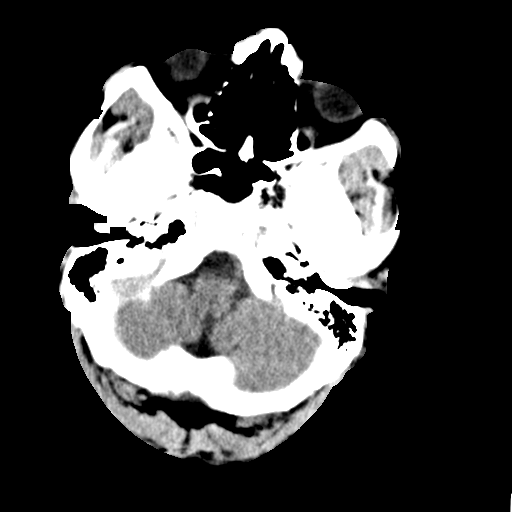
[im 9/32  brain]
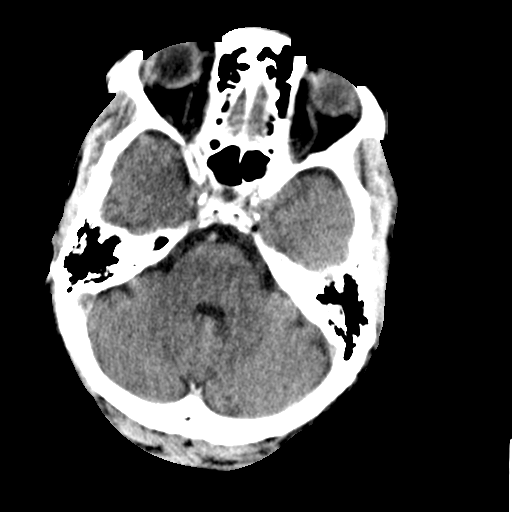
[im 12/32  brain]
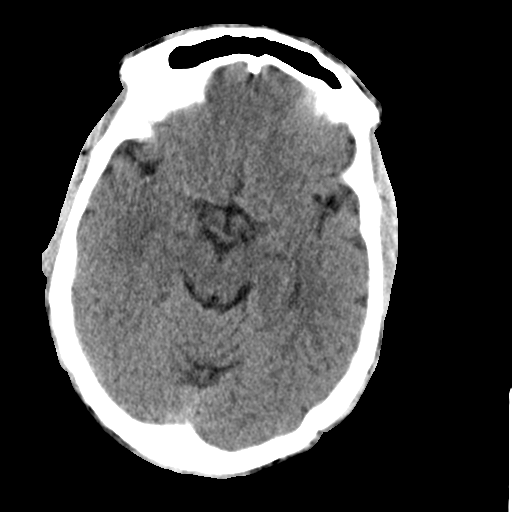
[im 17/32  brain]
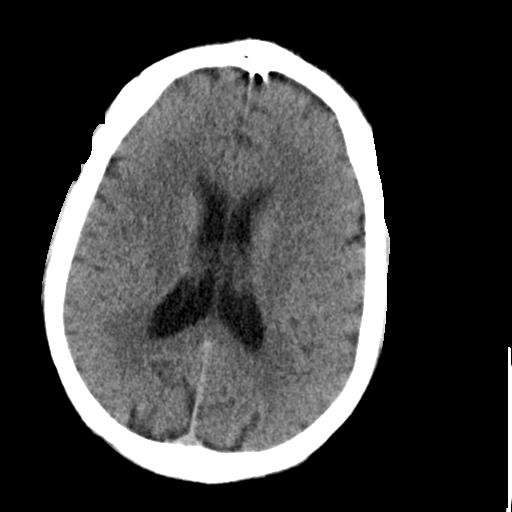
[im 17/32  bone]
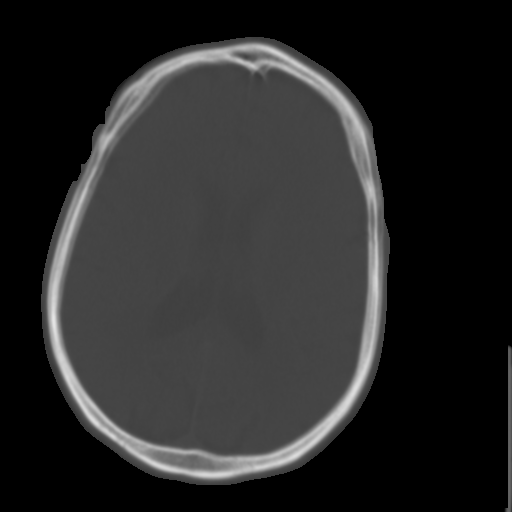
[im 20/32  brain]
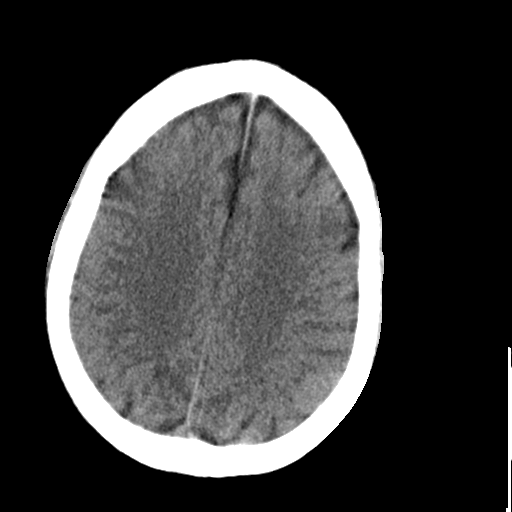
[im 23/32  brain]
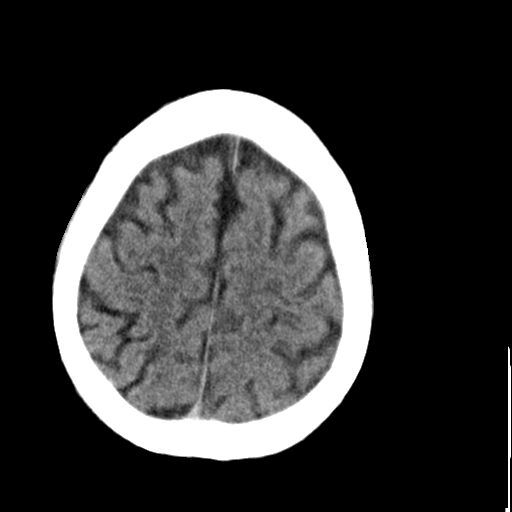
[im 26/32  brain]
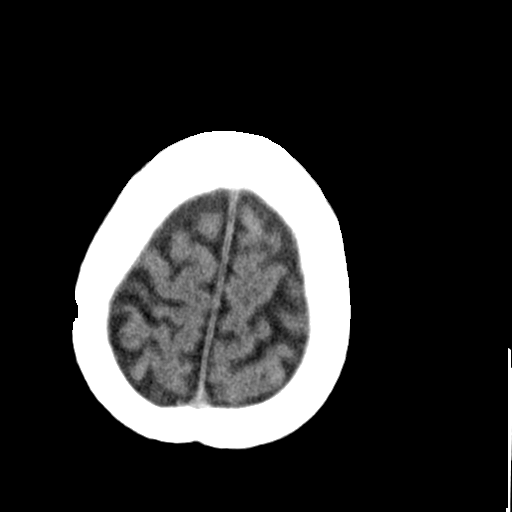
[im 29/32  brain]
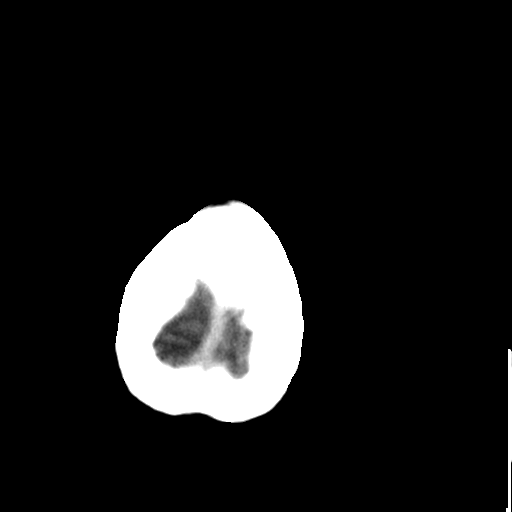
[im 29/32  bone]
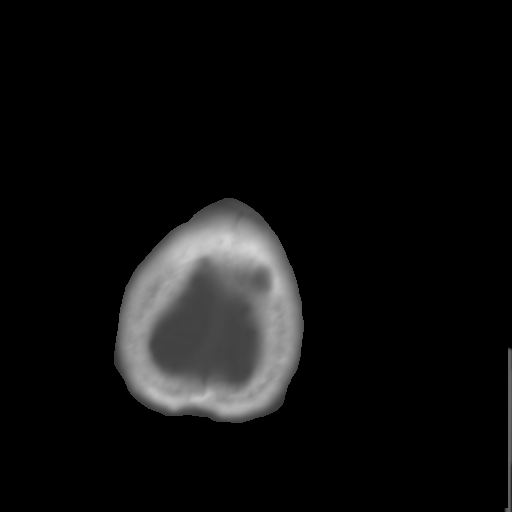

[Series 5: head 3.0 cor st · coronal · 0.36mm/px · 3 of 67 slices shown]
[im 23/67  brain]
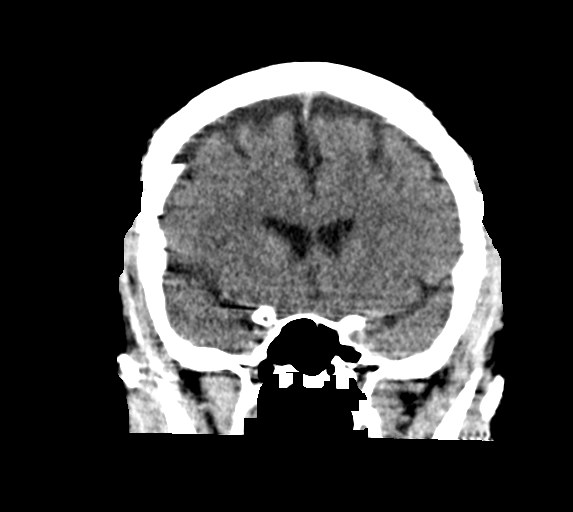
[im 30/67  brain]
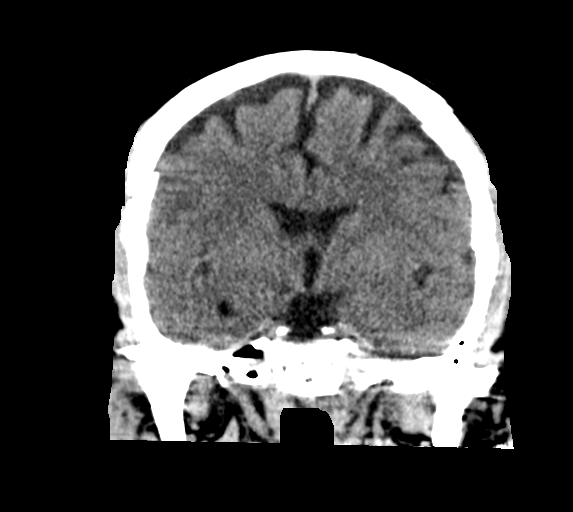
[im 37/67  brain]
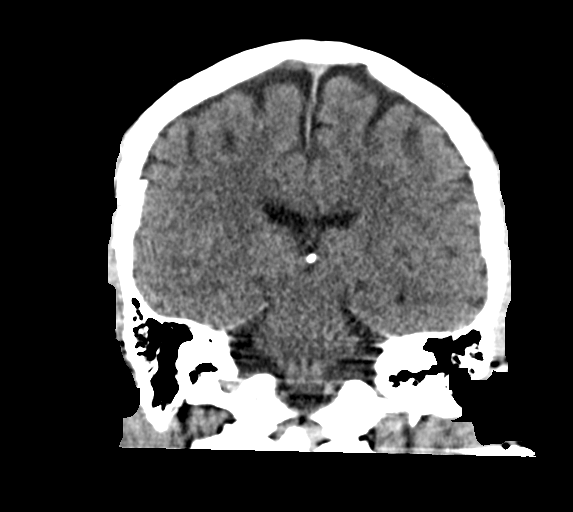

[Series 6: head 3.0 sag st · sagittal · 0.37mm/px · 3 of 66 slices shown]
[im 22/66  brain]
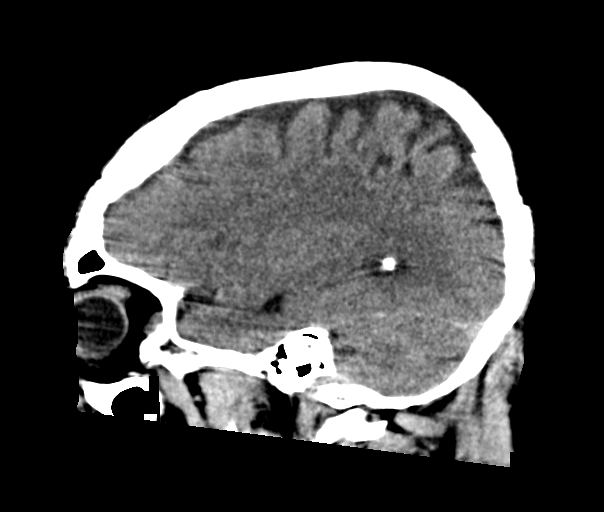
[im 33/66  brain]
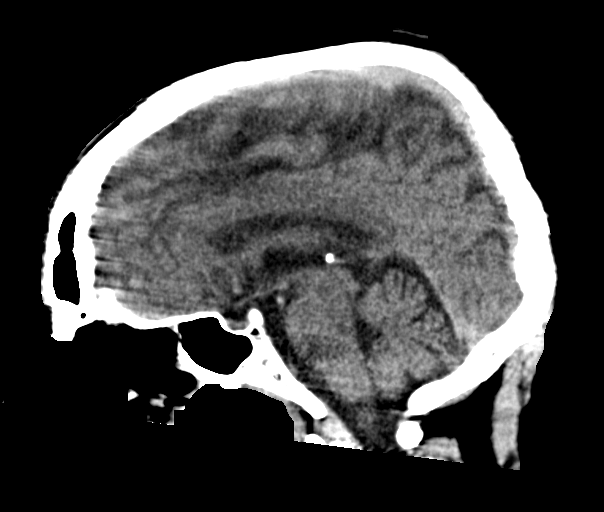
[im 44/66  brain]
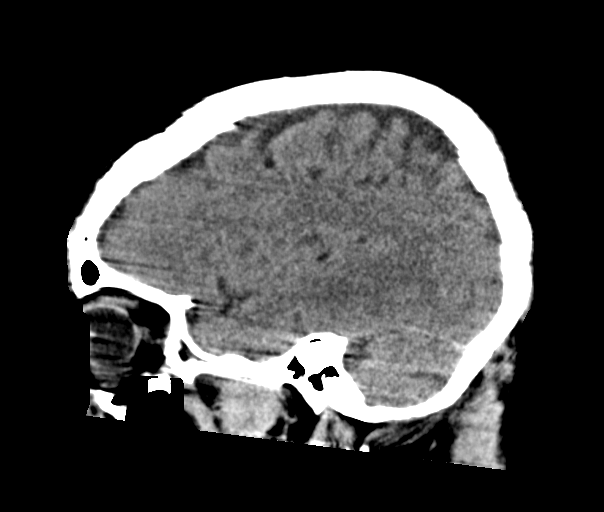

[15 of 47 positions shown; findings below may reference images not displayed]

FINDINGS: The patient was unable to remain motionless for the exam. Small or
subtle lesions could be overlooked.

Brain: Slight hypoattenuation of the RIGHT insular ribbon as
compared to the LEFT, could represent early ischemia. No hemorrhage,
mass lesion, hydrocephalus, or extra-axial fluid. Normal for age
cerebral volume. No definite white matter disease.

Vascular: There is Hounsfield artifact and mild motion which obscure
the slices through the circle of Willis. However, the RIGHT MCA is
questionably more dense than the LEFT, as seen on coronal series 5
image 25 and sagittal series 6, image 25.

Skull: Normal. Negative for fracture or focal lesion.

Sinuses/Orbits: No acute finding.

Other: None.

ASPECTS (Alberta Stroke Program Early CT Score)

- Ganglionic level infarction (caudate, lentiform nuclei, internal
capsule, insula, M1-M3 cortex): 6

- Supraganglionic infarction (M4-M6 cortex): 3

Total score (0-10 with 10 being normal): 9
IMPRESSION: 1. Somewhat reduced quality exam demonstrating equivocal
hypoattenuation of the RIGHT insular ribbon is compared to the LEFT.
RIGHT MCA may be questionably more dense than LEFT, on multiplanar
reformations.
2. ASPECTS is 9.
These results were communicated to Dr. Dieiro at [DATE] pmon
05/18/2018by text page via the [employer] messaging system.

## 2019-11-03 IMAGING — DX DG CHEST 1V PORT
1 series · 1 of 1 positions shown · non-contrast
Comparison: 10/05/2014

CLINICAL DATA: Ischemic stroke.

EXAM:
PORTABLE CHEST 1 VIEW

[chest]
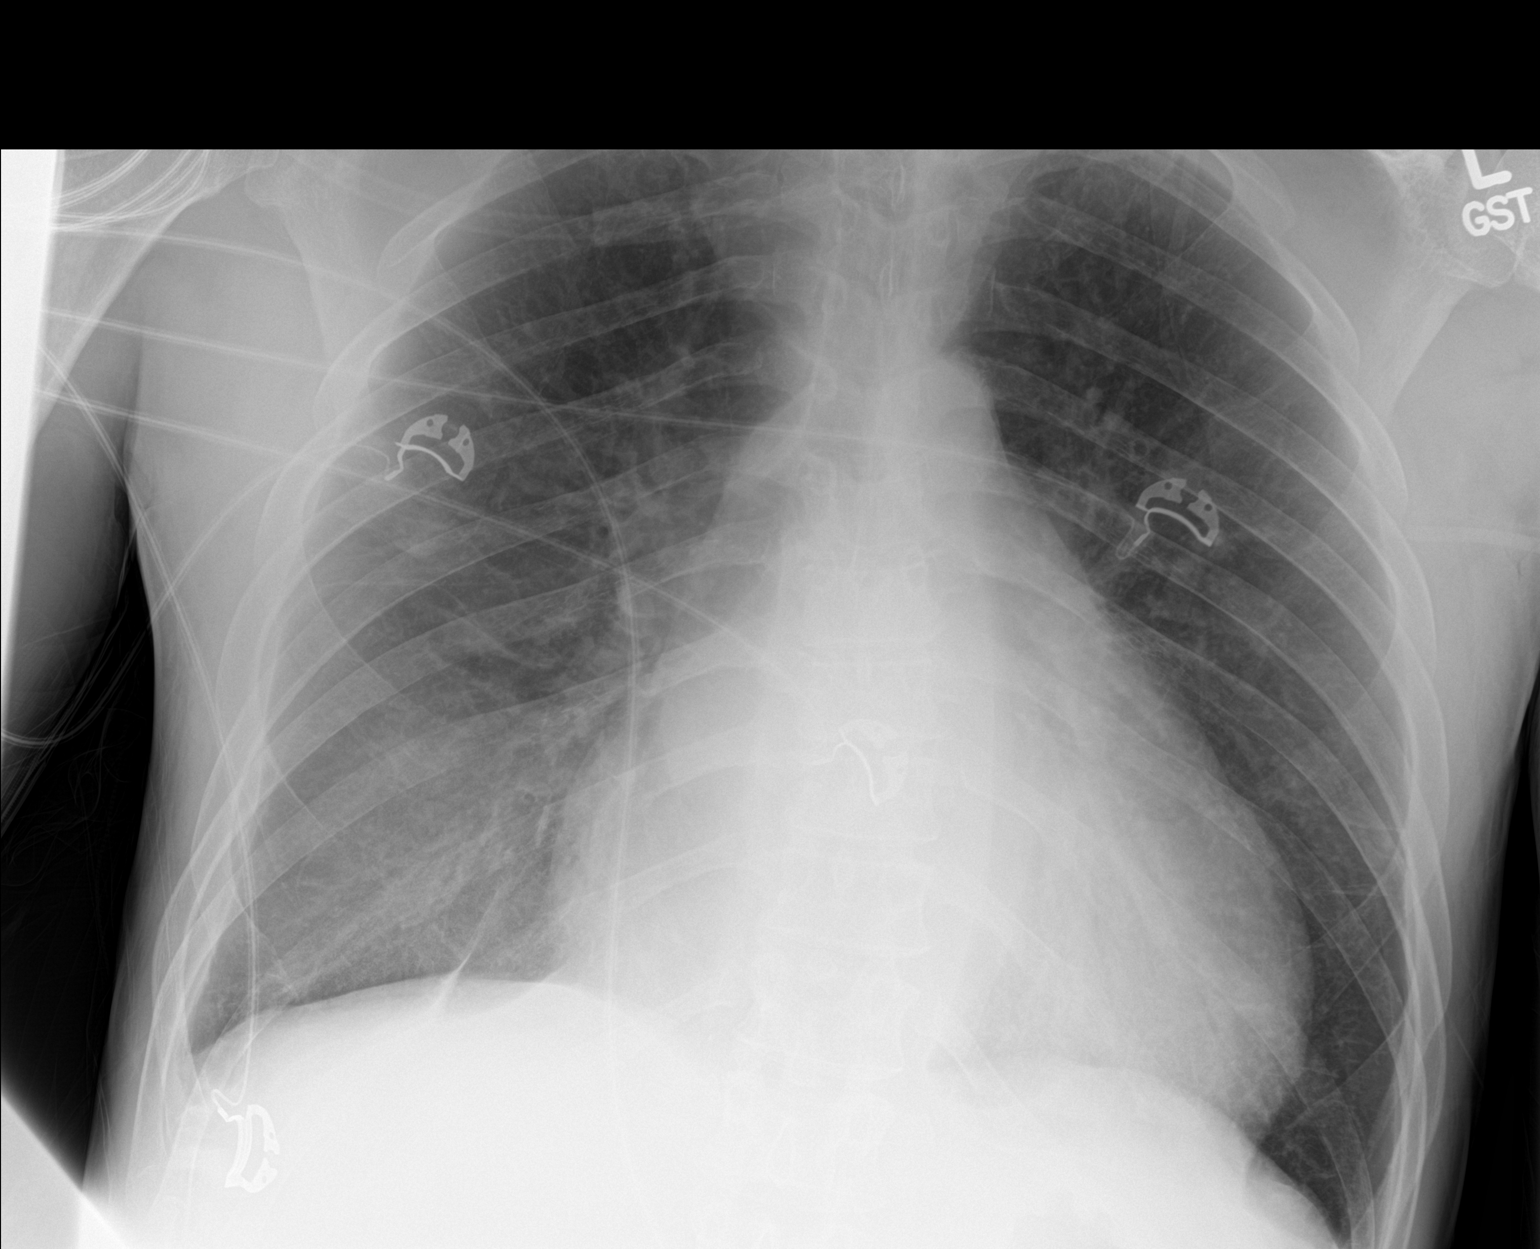

[1 of 1 positions shown; findings below may reference images not displayed]

FINDINGS: Cardiac enlargement. No vascular congestion. Increased density in
the right lower lung likely representing focal pneumonia. No
blunting of costophrenic angles. No pneumothorax. Mediastinal
contours appear intact.
IMPRESSION: Cardiac enlargement. Focal infiltration in the right lower lung
likely representing pneumonia.

## 2019-11-05 IMAGING — CR DG CHEST 1V PORT
1 series · 1 of 1 positions shown · non-contrast
Comparison: Portable chest x-ray May 18, 2018

CLINICAL DATA: Cough, CHF, acute CVA, right lower lobe infiltrate

EXAM:
PORTABLE CHEST 1 VIEW

[AP]
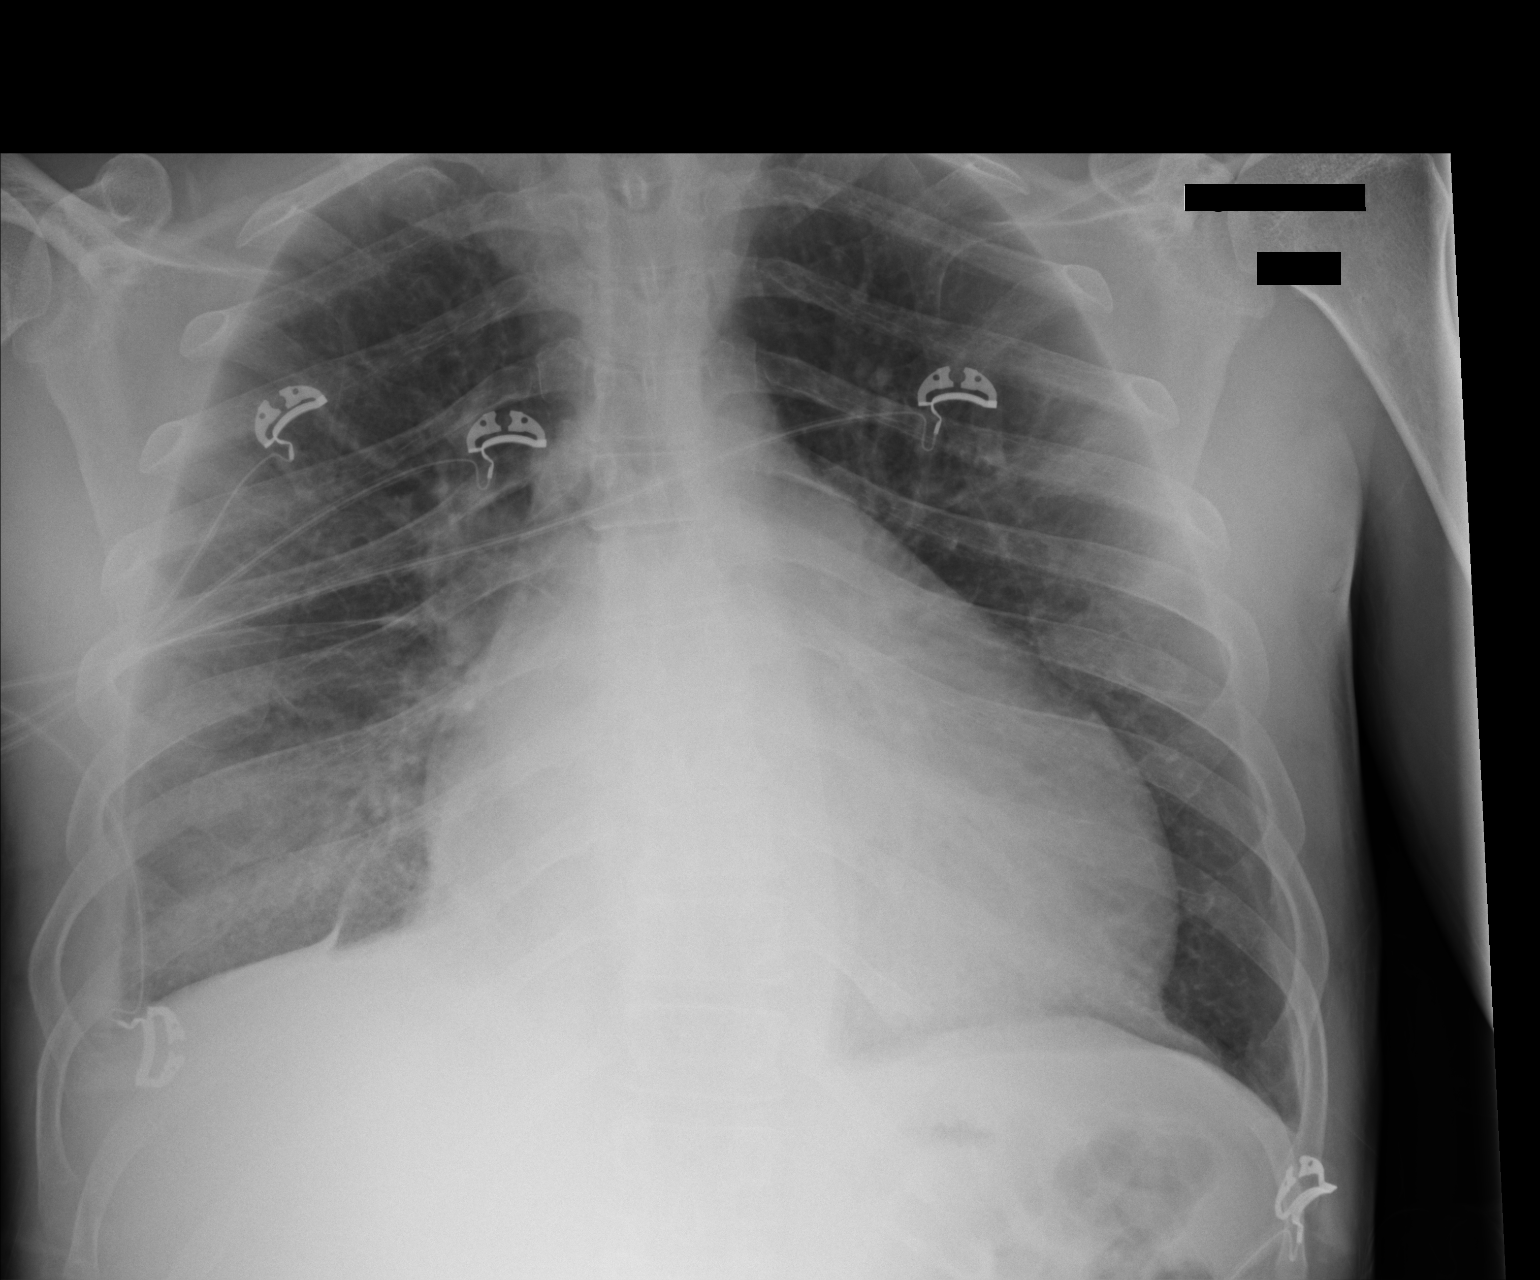

[1 of 1 positions shown; findings below may reference images not displayed]

FINDINGS: The lungs are mildly hyperinflated. There is persistent increased
density in the right lower hemithorax with small right pleural
effusion. The cardiac silhouette is enlarged. The pulmonary
vascularity is mildly prominent but stable. The observed bony thorax
is unremarkable.
IMPRESSION: COPD. Persistent infiltrate or atelectasis in the right lower lobe
with small right pleural effusion. Low-grade compensated CHF.

## 2019-12-28 DIAGNOSIS — Z23 Encounter for immunization: Secondary | ICD-10-CM | POA: Diagnosis not present

## 2019-12-28 DIAGNOSIS — Z7983 Long term (current) use of bisphosphonates: Secondary | ICD-10-CM | POA: Diagnosis not present

## 2019-12-28 DIAGNOSIS — K7469 Other cirrhosis of liver: Secondary | ICD-10-CM | POA: Diagnosis not present

## 2019-12-28 DIAGNOSIS — I425 Other restrictive cardiomyopathy: Secondary | ICD-10-CM | POA: Diagnosis not present

## 2019-12-28 DIAGNOSIS — G8929 Other chronic pain: Secondary | ICD-10-CM | POA: Diagnosis not present

## 2019-12-28 DIAGNOSIS — I4891 Unspecified atrial fibrillation: Secondary | ICD-10-CM | POA: Diagnosis not present

## 2019-12-28 DIAGNOSIS — K769 Liver disease, unspecified: Secondary | ICD-10-CM | POA: Diagnosis not present

## 2019-12-28 DIAGNOSIS — M810 Age-related osteoporosis without current pathological fracture: Secondary | ICD-10-CM | POA: Diagnosis not present

## 2019-12-28 DIAGNOSIS — K7689 Other specified diseases of liver: Secondary | ICD-10-CM | POA: Diagnosis not present

## 2019-12-28 DIAGNOSIS — Z8774 Personal history of (corrected) congenital malformations of heart and circulatory system: Secondary | ICD-10-CM | POA: Diagnosis not present

## 2019-12-28 DIAGNOSIS — Z8776 Personal history of (corrected) congenital malformations of integument, limbs and musculoskeletal system: Secondary | ICD-10-CM | POA: Diagnosis not present

## 2019-12-28 DIAGNOSIS — M1990 Unspecified osteoarthritis, unspecified site: Secondary | ICD-10-CM | POA: Diagnosis not present

## 2019-12-28 DIAGNOSIS — K7460 Unspecified cirrhosis of liver: Secondary | ICD-10-CM | POA: Diagnosis not present

## 2019-12-28 DIAGNOSIS — Z7901 Long term (current) use of anticoagulants: Secondary | ICD-10-CM | POA: Diagnosis not present

## 2019-12-28 DIAGNOSIS — Z8673 Personal history of transient ischemic attack (TIA), and cerebral infarction without residual deficits: Secondary | ICD-10-CM | POA: Diagnosis not present

## 2019-12-28 DIAGNOSIS — I10 Essential (primary) hypertension: Secondary | ICD-10-CM | POA: Diagnosis not present

## 2019-12-28 DIAGNOSIS — F329 Major depressive disorder, single episode, unspecified: Secondary | ICD-10-CM | POA: Diagnosis not present

## 2019-12-28 DIAGNOSIS — E119 Type 2 diabetes mellitus without complications: Secondary | ICD-10-CM | POA: Diagnosis not present

## 2020-03-15 ENCOUNTER — Other Ambulatory Visit (HOSPITAL_COMMUNITY): Payer: Self-pay | Admitting: Internal Medicine

## 2020-03-31 DIAGNOSIS — Z0001 Encounter for general adult medical examination with abnormal findings: Secondary | ICD-10-CM | POA: Diagnosis not present

## 2020-03-31 DIAGNOSIS — Z720 Tobacco use: Secondary | ICD-10-CM | POA: Diagnosis not present

## 2020-03-31 DIAGNOSIS — Z131 Encounter for screening for diabetes mellitus: Secondary | ICD-10-CM | POA: Diagnosis not present

## 2020-03-31 DIAGNOSIS — I425 Other restrictive cardiomyopathy: Secondary | ICD-10-CM | POA: Diagnosis not present

## 2020-03-31 DIAGNOSIS — I10 Essential (primary) hypertension: Secondary | ICD-10-CM | POA: Diagnosis not present

## 2020-03-31 DIAGNOSIS — Z125 Encounter for screening for malignant neoplasm of prostate: Secondary | ICD-10-CM | POA: Diagnosis not present

## 2020-03-31 DIAGNOSIS — I5020 Unspecified systolic (congestive) heart failure: Secondary | ICD-10-CM | POA: Diagnosis not present

## 2020-03-31 DIAGNOSIS — Z1329 Encounter for screening for other suspected endocrine disorder: Secondary | ICD-10-CM | POA: Diagnosis not present

## 2020-04-14 DIAGNOSIS — Z720 Tobacco use: Secondary | ICD-10-CM | POA: Diagnosis not present

## 2020-04-14 DIAGNOSIS — I425 Other restrictive cardiomyopathy: Secondary | ICD-10-CM | POA: Diagnosis not present

## 2020-04-14 DIAGNOSIS — I5020 Unspecified systolic (congestive) heart failure: Secondary | ICD-10-CM | POA: Diagnosis not present

## 2020-04-14 DIAGNOSIS — I10 Essential (primary) hypertension: Secondary | ICD-10-CM | POA: Diagnosis not present

## 2020-06-29 ENCOUNTER — Other Ambulatory Visit (HOSPITAL_COMMUNITY): Payer: Self-pay | Admitting: Internal Medicine

## 2020-07-18 ENCOUNTER — Other Ambulatory Visit (HOSPITAL_COMMUNITY): Payer: Self-pay | Admitting: Internal Medicine

## 2020-08-11 DIAGNOSIS — I10 Essential (primary) hypertension: Secondary | ICD-10-CM | POA: Diagnosis not present

## 2020-08-11 DIAGNOSIS — I5020 Unspecified systolic (congestive) heart failure: Secondary | ICD-10-CM | POA: Diagnosis not present

## 2020-08-11 DIAGNOSIS — Z720 Tobacco use: Secondary | ICD-10-CM | POA: Diagnosis not present

## 2020-08-11 DIAGNOSIS — Z23 Encounter for immunization: Secondary | ICD-10-CM | POA: Diagnosis not present

## 2020-08-11 DIAGNOSIS — R7303 Prediabetes: Secondary | ICD-10-CM | POA: Diagnosis not present

## 2020-08-11 DIAGNOSIS — I425 Other restrictive cardiomyopathy: Secondary | ICD-10-CM | POA: Diagnosis not present

## 2020-08-12 ENCOUNTER — Ambulatory Visit (HOSPITAL_COMMUNITY)
Admission: RE | Admit: 2020-08-12 | Discharge: 2020-08-12 | Disposition: A | Discharge status: Home / Self Care | Payer: Medicare Other | Source: Ambulatory Visit | Attending: Internal Medicine | Admitting: Internal Medicine

## 2020-08-12 ENCOUNTER — Other Ambulatory Visit: Payer: Self-pay

## 2020-08-12 ENCOUNTER — Encounter (HOSPITAL_COMMUNITY): Payer: Self-pay | Admitting: Internal Medicine

## 2020-08-12 VITALS — BP 104/60 | HR 83 | Ht 68.0 in | Wt 146.4 lb

## 2020-08-12 DIAGNOSIS — I110 Hypertensive heart disease with heart failure: Secondary | ICD-10-CM | POA: Insufficient documentation

## 2020-08-12 DIAGNOSIS — Z7901 Long term (current) use of anticoagulants: Secondary | ICD-10-CM | POA: Diagnosis not present

## 2020-08-12 DIAGNOSIS — I4821 Permanent atrial fibrillation: Secondary | ICD-10-CM

## 2020-08-12 DIAGNOSIS — I509 Heart failure, unspecified: Secondary | ICD-10-CM

## 2020-08-12 DIAGNOSIS — Z720 Tobacco use: Secondary | ICD-10-CM | POA: Insufficient documentation

## 2020-08-12 DIAGNOSIS — I425 Other restrictive cardiomyopathy: Secondary | ICD-10-CM

## 2020-08-12 DIAGNOSIS — I4891 Unspecified atrial fibrillation: Secondary | ICD-10-CM | POA: Diagnosis not present

## 2020-08-12 DIAGNOSIS — I4892 Unspecified atrial flutter: Secondary | ICD-10-CM | POA: Diagnosis not present

## 2020-08-12 DIAGNOSIS — Z79899 Other long term (current) drug therapy: Secondary | ICD-10-CM | POA: Diagnosis not present

## 2020-08-12 DIAGNOSIS — Z8673 Personal history of transient ischemic attack (TIA), and cerebral infarction without residual deficits: Secondary | ICD-10-CM | POA: Insufficient documentation

## 2020-08-12 DIAGNOSIS — E039 Hypothyroidism, unspecified: Secondary | ICD-10-CM | POA: Diagnosis not present

## 2020-08-12 DIAGNOSIS — F1290 Cannabis use, unspecified, uncomplicated: Secondary | ICD-10-CM | POA: Insufficient documentation

## 2020-08-12 DIAGNOSIS — I493 Ventricular premature depolarization: Secondary | ICD-10-CM | POA: Diagnosis not present

## 2020-08-12 DIAGNOSIS — I5032 Chronic diastolic (congestive) heart failure: Secondary | ICD-10-CM | POA: Insufficient documentation

## 2020-08-12 LAB — CBC
HCT: 47.8 % (ref 39.0–52.0)
Hemoglobin: 15.3 g/dL (ref 13.0–17.0)
MCH: 28.2 pg (ref 26.0–34.0)
MCHC: 32 g/dL (ref 30.0–36.0)
MCV: 88.2 fL (ref 80.0–100.0)
Platelets: 313 10*3/uL (ref 150–400)
RBC: 5.42 MIL/uL (ref 4.22–5.81)
RDW: 14 % (ref 11.5–15.5)
WBC: 4.3 10*3/uL (ref 4.0–10.5)
nRBC: 0 % (ref 0.0–0.2)

## 2020-08-12 LAB — COMPREHENSIVE METABOLIC PANEL
ALT: 27 U/L (ref 0–44)
AST: 47 U/L — ABNORMAL HIGH (ref 15–41)
Albumin: 3 g/dL — ABNORMAL LOW (ref 3.5–5.0)
Alkaline Phosphatase: 181 U/L — ABNORMAL HIGH (ref 38–126)
Anion gap: 11 (ref 5–15)
BUN: 10 mg/dL (ref 6–20)
CO2: 19 mmol/L — ABNORMAL LOW (ref 22–32)
Calcium: 8.7 mg/dL — ABNORMAL LOW (ref 8.9–10.3)
Chloride: 102 mmol/L (ref 98–111)
Creatinine, Ser: 0.81 mg/dL (ref 0.61–1.24)
GFR, Estimated: >60 mL/min (ref >60)
Glucose, Bld: 89 mg/dL (ref 70–99)
Potassium: 4.2 mmol/L (ref 3.5–5.1)
Sodium: 132 mmol/L — ABNORMAL LOW (ref 135–145)
Total Bilirubin: 1.7 mg/dL — ABNORMAL HIGH (ref 0.3–1.2)
Total Protein: 7.3 g/dL (ref 6.5–8.1)

## 2020-08-12 LAB — BRAIN NATRIURETIC PEPTIDE: B Natriuretic Peptide: 553 pg/mL — ABNORMAL HIGH (ref 0.0–100.0)

## 2020-08-12 LAB — LIPID PANEL
Cholesterol: 86 mg/dL (ref 0–200)
HDL: 35 mg/dL — ABNORMAL LOW (ref >40)
LDL Cholesterol: 41 mg/dL (ref 0–99)
Total CHOL/HDL Ratio: 2.5 RATIO
Triglycerides: 49 mg/dL (ref <150)
VLDL: 10 mg/dL (ref 0–40)

## 2020-08-12 NOTE — Patient Instructions (Signed)
It was great to see you today! No medication changes are needed at this time.  Labs today We will only contact you if something comes back abnormal or we need to make some changes. Otherwise no news is good news!  Your physician has requested that you have an echocardiogram. Echocardiography is a painless test that uses sound waves to create images of your heart. It provides your doctor with information about the size and shape of your heart and how well your heart's chambers and valves are working. This procedure takes approximately one hour. There are no restrictions for this procedure.  You have been ordered a PYP Scan.  This is done in the Radiology Department of [facility].  When you come for this test please plan to be there 2-3 hours.   Your physician recommends that you schedule a follow-up appointment in: 6 months with Dr Haroldine Laws  If you have any questions or concerns before your next appointment please send [place] a message through [place] or call our office at [phone].    TO LEAVE A MESSAGE FOR THE NURSE SELECT OPTION 2, PLEASE LEAVE A MESSAGE INCLUDING: . YOUR NAME . DATE OF BIRTH . CALL BACK NUMBER . REASON FOR CALL**this is important as we prioritize the call backs  YOU WILL RECEIVE A CALL BACK THE SAME DAY AS LONG AS YOU CALL BEFORE 4:00 PM .

## 2020-08-12 NOTE — Progress Notes (Signed)
Patient ID: Charles Hawkins, male   DOB: May 26, 1971, 49 y.o.   MRN: [id number]    ADVANCED HF CLINIC CONSULT NOTE  HF: Charles Hawkins   HPI: Charles Hawkins is a 49 y.o. male with h/o HTN, restrictive CMP, Afib, h/o bilateral club feet s/p surgical repair, gout, arthritis, h/o CVA on Eliquis, and chronic pain.   Charles Hawkins had heart transplant at 39 age 98 months old due to reported infiltrative CM (exact diagnosis not reported in records I have). Apparently after that, Charles Hawkins underwent cardiac workup and cMRI suggested infiltrative CM with normal EF. Has had exhaustive w/u since that time under the guidance of Charles Hawkins at [facility] without clear diagnosis. 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 Wife got job in [employer] with [place] and [place]. He denies FHx of HF except for his Charles Hawkins  Charles Hawkins in hematology and Charles Hawkins in Rheumatology and he was apparently told he did not have a systemic problem at that time.   Seen for Acute CVA in 05/19/18. He had only missed one dose of Eliquis as outpatient. Had mild left facial droop as only persistent deficit. Was supposed to follow up with Charles Hawkins at [facility] in the transplant center, but has moved to [facility] so never did.   Admitted 10/17-10/22/19 with pneumonia and hypotension. He was treated with IV abx, then transitioned to Augmentin. HF team consulted for volume overload. He was diuresed with IV lasix, then transitioned to lasix 80 mg am, 40 mg pm. DC weight: 146 lbs.  He returns today for HF follow up. Says he has kept low due to Covid restrictions. Has not done much but feels good. Can do all activities without problem. Feels a little congested when he lies down at times. No CP, LE edema. Has not been at [facility] in a long time. Compliant with meds including Eliquis.    Studies:   - cMRI - LVEF 63% moderate concentric LVH 1.6 cm. RV normal. Biatrial enlargement, Diffuse infiltrative process involving both ventricles - EM  biopsy 11/14 - negative - Fat pad biopsy - neagtive - Serologies all normal  - Event monitor 5/15; SR with PVCs  - Repeat cMRI in 5/15      1. Normal LV size and systolic function, EF 65%. Mild LV     hypertrophy. Normal RV size and systolic function.    2. Moderate to severe biatrial enlargement.    3. Evidence for diffuse infiltrative process by delayed enhancement     imaging, possible cardiac amyloidosis.  CPX 4/15  Resting HR: 75 Peak HR: 130 (72% age predicted max HR) BP rest: 102/58 BP peak: 128/62 Peak VO2: 13.9 (38.8% predicted peak VO2) VE/VCO2 slope: 41.4 OUES: 1.09 Peak RER: 1.13 Ventilatory Threshold: 10.2 (28.5% predicted peak VO2) Peak RR 32 Peak Ventilation: 46.3 VE/MVV: 32.2% PETCO2 at peak: 26 O2pulse: 8 (57% predicted O2pulse) - Echo (2/16) with EF 35-40%, thick LV and RV with starry night pattern, moderately hypokinetic RV, severe biatrial enlargement.   Event monitor showing multiple runs of atrial tach/flutter 150-160s. He was started on amiodarone and apixaban.   Review of systems complete and found to be negative unless listed in HPI.   Current Outpatient Medications on File Prior to Encounter  Medication Sig Dispense Refill  . ELIQUIS 5 MG TABS tablet TAKE 1 TABLET (5 MG TOTAL) BY MOUTH 2 (TWO) TIMES DAILY. 60 tablet 5  . feeding supplement, ENSURE ENLIVE, (ENSURE ENLIVE) LIQD Take 237  mLs by mouth 3 (three) times daily between meals.    . furosemide (LASIX) 80 MG tablet TAKE 1 TABLET EVERY MORNING AND 1/2TAB EVERY EVENING. MAY TAKE AN ADD'L 40 MG IN THE PM AS NEEDED. 270 tablet 0  . Multiple Vitamin (MULTIVITAMIN WITH MINERALS) TABS tablet Take 1 tablet by mouth daily.     . potassium chloride (MICRO-K) 10 MEQ CR capsule Take 4 capsules (40 mEq total) by mouth daily. 360 capsule 0   No current facility-administered medications on file prior to encounter.    PHYSICAL EXAM: Vitals:   08/12/20 0907  BP: 104/60  Pulse: 83  SpO2: 94%  Weight:  66.4 kg (146 lb 6.4 oz)  Height: 5\' 8"  (1.727 m)   Wt Readings from Last 3 Encounters:  08/12/20 66.4 kg (146 lb 6.4 oz)  12/17/18 67 kg (147 lb 12.8 oz)  12/08/18 66.7 kg (147 lb)   General:  Well appearing. No resp difficulty HEENT: normal Neck: supple. no JVD. Carotids 2+ bilat; no bruits. No lymphadenopathy or thryomegaly appreciated. Cor: PMI nondisplaced. Irregular rate & rhythm. No rubs, gallops or murmurs. Lungs: clear Abdomen: soft, nontender, nondistended. No hepatosplenomegaly. No bruits or masses. Good bowel sounds. Extremities: no cyanosis, clubbing, rash, edema Neuro: alert & orientedx3, cranial nerves grossly intact. moves all 4 extremities w/o difficulty. Affect pleasant   ECG: AF 84  No PVC. Personally reviewed   ASSESSMENT & PLAN:  1. 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  -cMRI 5/15Normal LV size and systolic function, EF 42%. Mild LV hypertrophy. Normal RV size and systolic function,moderate to severe biatrial enlargement,Evidence for diffuse infiltrative process by delayed enhancementimaging, possible cardiac amyloidosis. - EM biopsy 2/14. Nonspecific myocardial degeneration. -Followed by [facility] for possible transplant. He has had transplant evaluation and was not listed due to tobacco and THC use. -Last seen 11/2017 by [facility]. NYHA II at that time counseled to stop THC use and f/u yearly. He ahs not followed up  - Echo 05/19/18 EF 55-60%. - Stable NYHA II  - Volume status stable on exam. - Continue lasix 80 mg am, 40 mg pm. Okay to take extra 40 mg PRN.   - Stopped spiro due to groggyness - Previously onentrestobut stoppeddue to intolerance. - Has previously refused coreg despite urgings by myself and Charles. Posey Hawkins  - Prefers to stay off heart meds for now. Will reassess EF. If dropping will  revisit with him  - Encouraged him to keep regular f/u with [facility]  - Will get repeat echo and labs  - Check PYP scan to assess for TTR  2.CVA7/2019 -He underwent emergent CTA7/2043found a right M1 MCA occlusion consistent with an embolic stroke.Underwentendovascular revascularization of the rightMCAwith x 1 pass with 85mm x 37mm embotrap retrieve device achieving a TICI 3reperfusion. - doing great. No residual - Continue Eliquis. - Continue atorvastatin (says he is taking at night)   3. A fibrillation/Flutter, chronic - Remains in Afib/Flutter. Rate controlled -Amiodaronepreviouslystopped by his PCP due to low BP and hypothyroidism. - Has previously been inA flutterand discussedA flutter ablationbut did not go to appt. - Continue eliquis 5 mg BID. Denies bleeding   4. Frequent PVCs - Previously taken off amio for afib due to low BP and hypothyroidism. No PVCs on ECG today    Glori Bickers MD 08/12/2020

## 2020-08-15 DIAGNOSIS — K7469 Other cirrhosis of liver: Secondary | ICD-10-CM | POA: Diagnosis not present

## 2020-08-15 DIAGNOSIS — M19072 Primary osteoarthritis, left ankle and foot: Secondary | ICD-10-CM | POA: Diagnosis not present

## 2020-08-15 DIAGNOSIS — E119 Type 2 diabetes mellitus without complications: Secondary | ICD-10-CM | POA: Diagnosis not present

## 2020-08-15 DIAGNOSIS — Z79890 Hormone replacement therapy (postmenopausal): Secondary | ICD-10-CM | POA: Diagnosis not present

## 2020-08-15 DIAGNOSIS — Z7983 Long term (current) use of bisphosphonates: Secondary | ICD-10-CM | POA: Diagnosis not present

## 2020-08-15 DIAGNOSIS — I425 Other restrictive cardiomyopathy: Secondary | ICD-10-CM | POA: Diagnosis not present

## 2020-08-15 DIAGNOSIS — Z8673 Personal history of transient ischemic attack (TIA), and cerebral infarction without residual deficits: Secondary | ICD-10-CM | POA: Diagnosis not present

## 2020-08-15 DIAGNOSIS — M810 Age-related osteoporosis without current pathological fracture: Secondary | ICD-10-CM | POA: Diagnosis not present

## 2020-08-15 DIAGNOSIS — K7689 Other specified diseases of liver: Secondary | ICD-10-CM | POA: Diagnosis not present

## 2020-08-15 DIAGNOSIS — K7460 Unspecified cirrhosis of liver: Secondary | ICD-10-CM | POA: Diagnosis not present

## 2020-08-15 DIAGNOSIS — I10 Essential (primary) hypertension: Secondary | ICD-10-CM | POA: Diagnosis not present

## 2020-08-15 DIAGNOSIS — I4891 Unspecified atrial fibrillation: Secondary | ICD-10-CM | POA: Diagnosis not present

## 2020-08-15 DIAGNOSIS — R918 Other nonspecific abnormal finding of lung field: Secondary | ICD-10-CM | POA: Diagnosis not present

## 2020-08-15 DIAGNOSIS — G8929 Other chronic pain: Secondary | ICD-10-CM | POA: Diagnosis not present

## 2020-08-15 DIAGNOSIS — Z7901 Long term (current) use of anticoagulants: Secondary | ICD-10-CM | POA: Diagnosis not present

## 2020-08-15 DIAGNOSIS — M19071 Primary osteoarthritis, right ankle and foot: Secondary | ICD-10-CM | POA: Diagnosis not present

## 2020-08-16 LAB — MULTIPLE MYELOMA PANEL, SERUM
Albumin SerPl Elph-Mcnc: 3 g/dL (ref 2.9–4.4)
Albumin/Glob SerPl: 0.7 (ref 0.7–1.7)
Alpha 1: 0.3 g/dL (ref 0.0–0.4)
Alpha2 Glob SerPl Elph-Mcnc: 0.8 g/dL (ref 0.4–1.0)
B-Globulin SerPl Elph-Mcnc: 1.1 g/dL (ref 0.7–1.3)
Gamma Glob SerPl Elph-Mcnc: 2.1 g/dL — ABNORMAL HIGH (ref 0.4–1.8)
Globulin, Total: 4.3 g/dL — ABNORMAL HIGH (ref 2.2–3.9)
IgA: 357 mg/dL (ref 90–386)
IgG (Immunoglobin G), Serum: 2312 mg/dL — ABNORMAL HIGH (ref 603–1613)
IgM (Immunoglobulin M), Srm: 182 mg/dL — ABNORMAL HIGH (ref 20–172)
Total Protein ELP: 7.3 g/dL (ref 6.0–8.5)

## 2020-08-19 ENCOUNTER — Ambulatory Visit (HOSPITAL_COMMUNITY)
Admission: RE | Admit: 2020-08-19 | Discharge: 2020-08-19 | Disposition: A | Discharge status: Home / Self Care | Payer: Medicare Other | Source: Ambulatory Visit | Attending: Internal Medicine | Admitting: Internal Medicine

## 2020-08-19 ENCOUNTER — Other Ambulatory Visit: Payer: Self-pay

## 2020-08-19 DIAGNOSIS — I341 Nonrheumatic mitral (valve) prolapse: Secondary | ICD-10-CM | POA: Insufficient documentation

## 2020-08-19 DIAGNOSIS — I509 Heart failure, unspecified: Secondary | ICD-10-CM | POA: Diagnosis present

## 2020-08-19 DIAGNOSIS — I5032 Chronic diastolic (congestive) heart failure: Secondary | ICD-10-CM | POA: Diagnosis not present

## 2020-08-19 DIAGNOSIS — E785 Hyperlipidemia, unspecified: Secondary | ICD-10-CM | POA: Insufficient documentation

## 2020-08-19 DIAGNOSIS — I110 Hypertensive heart disease with heart failure: Secondary | ICD-10-CM | POA: Diagnosis not present

## 2020-08-19 DIAGNOSIS — I4892 Unspecified atrial flutter: Secondary | ICD-10-CM | POA: Insufficient documentation

## 2020-08-19 DIAGNOSIS — I081 Rheumatic disorders of both mitral and tricuspid valves: Secondary | ICD-10-CM | POA: Insufficient documentation

## 2020-08-19 NOTE — Progress Notes (Signed)
Echocardiogram 2D Echocardiogram has been performed.  Oneal Deputy Dayja Loveridge 08/19/2020, 12:12 PM

## 2020-08-23 LAB — ECHOCARDIOGRAM COMPLETE: S' Lateral: 2.5 cm

## 2020-09-10 ENCOUNTER — Other Ambulatory Visit (HOSPITAL_COMMUNITY): Payer: Self-pay | Admitting: Internal Medicine

## 2020-09-27 ENCOUNTER — Telehealth (HOSPITAL_COMMUNITY): Payer: Self-pay | Admitting: *Deleted

## 2020-09-27 NOTE — Telephone Encounter (Signed)
Pt called stating his GI doctor started him on carvedilol 6.25mg  twice daily on 10/18 (see note below) but his systolic bp was running in the low 80's and he was feeling tired/drained. Pt said he cut the carvedilol in half and feels better he just wanted to inform Dr.Bensimhon.  ADDENDUM: we obtained a fibroscan for variceal risk stratification and it demonstrated LSM of 45.7 kPa. I explained that, while his HVPG was previously low, it's possible that his degree of portal hypertension has progressed over the past year. I recommended that he either consider EGD or empiric prophylaxis with carvedilol. The patient stated that his cardiologist has also recommended carvedilol. Therefore, after discussing the benefits and risks, he agreed to re-initiate carvedilol 3.125 mg BID -> 6.25 mg BID in 4 days if tolerated. If patient remains on this medication and does not decompensate, he does not need a screening EGD. Electronically signed by Almon Hercules, MD at 08/15/2020 7:31 PM EDT

## 2020-10-04 ENCOUNTER — Other Ambulatory Visit (HOSPITAL_COMMUNITY): Payer: Self-pay | Admitting: Internal Medicine

## 2020-10-27 ENCOUNTER — Other Ambulatory Visit (HOSPITAL_COMMUNITY): Payer: Self-pay | Admitting: Internal Medicine

## 2020-11-01 DIAGNOSIS — I10 Essential (primary) hypertension: Secondary | ICD-10-CM | POA: Diagnosis not present

## 2020-11-01 DIAGNOSIS — Z7901 Long term (current) use of anticoagulants: Secondary | ICD-10-CM | POA: Diagnosis not present

## 2020-11-01 DIAGNOSIS — M8588 Other specified disorders of bone density and structure, other site: Secondary | ICD-10-CM | POA: Diagnosis not present

## 2020-11-01 DIAGNOSIS — R7303 Prediabetes: Secondary | ICD-10-CM | POA: Diagnosis not present

## 2020-11-01 DIAGNOSIS — E038 Other specified hypothyroidism: Secondary | ICD-10-CM | POA: Diagnosis not present

## 2020-11-01 DIAGNOSIS — I425 Other restrictive cardiomyopathy: Secondary | ICD-10-CM | POA: Diagnosis not present

## 2020-11-01 DIAGNOSIS — I5020 Unspecified systolic (congestive) heart failure: Secondary | ICD-10-CM | POA: Diagnosis not present

## 2020-11-01 DIAGNOSIS — Z720 Tobacco use: Secondary | ICD-10-CM | POA: Diagnosis not present

## 2020-12-14 ENCOUNTER — Other Ambulatory Visit (HOSPITAL_COMMUNITY): Payer: Self-pay | Admitting: *Deleted

## 2020-12-14 MED ORDER — POTASSIUM CHLORIDE ER 10 MEQ PO CPCR: 40.0000 meq | ORAL_CAPSULE | Freq: Every day | ORAL | 3 refills | Status: DC

## 2021-01-30 DIAGNOSIS — M8588 Other specified disorders of bone density and structure, other site: Secondary | ICD-10-CM | POA: Diagnosis not present

## 2021-01-30 DIAGNOSIS — Z720 Tobacco use: Secondary | ICD-10-CM | POA: Diagnosis not present

## 2021-01-30 DIAGNOSIS — I425 Other restrictive cardiomyopathy: Secondary | ICD-10-CM | POA: Diagnosis not present

## 2021-01-30 DIAGNOSIS — R7303 Prediabetes: Secondary | ICD-10-CM | POA: Diagnosis not present

## 2021-01-30 DIAGNOSIS — E038 Other specified hypothyroidism: Secondary | ICD-10-CM | POA: Diagnosis not present

## 2021-01-30 DIAGNOSIS — Z7901 Long term (current) use of anticoagulants: Secondary | ICD-10-CM | POA: Diagnosis not present

## 2021-01-30 DIAGNOSIS — I5020 Unspecified systolic (congestive) heart failure: Secondary | ICD-10-CM | POA: Diagnosis not present

## 2021-01-30 DIAGNOSIS — I10 Essential (primary) hypertension: Secondary | ICD-10-CM | POA: Diagnosis not present

## 2021-02-03 ENCOUNTER — Telehealth: Payer: Self-pay | Admitting: Physician Assistant

## 2021-02-03 NOTE — Telephone Encounter (Signed)
Received a new hem referral from [facility] for leukopenia. Charles Hawkins has been cld and scheduled to see [name] on 4/11 at 9am w/labs at 8:30am.

## 2021-02-05 NOTE — Progress Notes (Signed)
[place] [phone]   [phone]  CONSULT NOTE  REFERRING PHYSICIAN: Dr. Vista Lawman  REASON FOR CONSULTATION:  Leukopenia  HPI Charles Hawkins is a 50 y.o. male with a past medical history significant for congestive heart failure/restrictive cardiomyopathy unclear etiology, cirrhosis, tobacco abuse, vitamin D deficiency, hypothyroidism, PVD, pre-diabetes, hypertension, middle cerebral artery embolism, atrial flutter, community-acquired pneumonia, gout, and hyperlipidemia is referred to the clinic for evaluation of leukocytopenia.   The patient recently had a follow-up visit with his primary care provider on 01/30/2021 for a routine 8-monthfollow-up visit to review current medications and possible medication side effects and drug interactions.  He had blood work performed that day which showed a total white blood cell count at 2.6.  A normal hemoglobin of 15.2, and normal platelet count at 249K, a low absolute neutrophil count at 1229.  He was subsequently referred to the clinic for this finding.   Per chart review, the oldest records available to me are from 2015.  From 2015 to the present the patient has had a few episodes of intermittent mild leukopenia with the lowest of WBC count of 2.6. He is feeling well today. The patient denies any significant fatigue except when he feels like he has "chest congestion" from his CHF he reports feeling "sluggish". Of note, he also reports worsening shortness of breath with these episodes. Denies any lightheadedness.  The patient denies any fever, chills, night sweats, or unexplained weight loss.  The patient denies frequent infections. He estimates he gets sick with a "head cold" about 1x per year. Denies recent illness. Denies skin infections, dysuria, diarrhea, bloating, or abdominal pain. Denies cough, nasal congestion, or sore throat. The patient denies any history of viral infections such as HIV, hepatitis, or mono.   Per chart review, I can see that the patient was tested for hepatitis in fall 2021 which was negative. He had an extensive workup due to his cirrhosis for which [facility] The patient denies any occupational hazards/exposures.  The patient denies any immunosuppressive medications.  The patient denies any history of autoimmune disease but it appears he had a positive ANA in 2015. The patient denies known autoimmune disorders. He does not remember seeing a rheumatologist in the past but the note from his cardiologist from 08/12/20 mentions seeing a [facility] and he was apparently told he did not have a systemic problem at that time.  The patient denies any joint pain, oral ulcers, rashes, or swelling.   The patient denies any herbal supplements. He denies abnormal bleeding or bruising.   The patient denies any family history of bone marrow disorders or hematologic malignancies. His family history consists of a maternal grandmother who passed away secondary to lung cancer, a sister with skin cancer, a sister with questionable thyroid cancer, mother and father with hypertension, and a sister with questionable lupus diagnosed at age 48  The patient is on unemployment and has been unemployed for 5 years.  He used to work as a tAdministrator  The patient is single and has 7 children.  He smoked approximately 23 years and estimates that 1 pack of cigarettes would last him 4 days.  He quit smoking in 2017.  The patient denies any drug use except for occasional marijuana.  The patient estimates that he drinks 8 cans of beer per week.     HPI  Past Medical History:  Diagnosis Date  . Arthritis   .  CHF (congestive heart failure) ([place])   . HTN (hypertension)   . 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 Multiple areas of subendocardial enhancement on MRI. Fat pad and myocardial biopsy negative at [facility].   . Stroke [facility])     Past Surgical History:  Procedure  Laterality Date  . IR ANGIO VERTEBRAL SEL SUBCLAVIAN INNOMINATE UNI R MOD SED  05/18/2018  . IR CT HEAD LTD  05/18/2018  . IR PERCUTANEOUS ART THROMBECTOMY/INFUSION INTRACRANIAL INC DIAG ANGIO  05/18/2018  . RADIOLOGY WITH ANESTHESIA N/A 05/18/2018   Procedure: RADIOLOGY WITH ANESTHESIA;  Surgeon: Luanne Bras, MD;  Location: [facility];  Service: Radiology;  Laterality: N/A;    Family History  Problem Relation Age of Onset  . Heart failure Son 1       s/p heart transplant  . Stroke Brother 49       Deceased  . Stroke Mother   . Hypertension Father     Social History Social History   Tobacco Use  . Smoking status: Former Smoker    Packs/day: 0.25    Years: 20.00    Pack years: 5.00    Types: Cigarettes    Quit date: 06/30/2015    Years since quitting: 5.6  . Smokeless tobacco: Never Used  Vaping Use  . Vaping Use: Never used  Substance Use Topics  . Alcohol use: Yes    Alcohol/week: 1.0 standard drink    Types: 1 Cans of beer per week    Comment: occasionally  . Drug use: Yes    Types: Marijuana    Comment: marijuana occasionally    Allergies  Allergen Reactions  . Lyrica [Pregabalin] Anaphylaxis  . Nsaids Other (See Comments)    Current Outpatient Medications  Medication Sig Dispense Refill  . atorvastatin (LIPITOR) 20 MG tablet Take 1 tablet by mouth.    Arne Cleveland 5 MG TABS tablet TAKE 1 TABLET (5 MG TOTAL) BY MOUTH 2 (TWO) TIMES DAILY. 60 tablet 5  . feeding supplement, ENSURE ENLIVE, (ENSURE ENLIVE) LIQD Take 237 mLs by mouth 3 (three) times daily between meals.    . furosemide (LASIX) 80 MG tablet TAKE 1 TABLET EVERY MORNING AND 1/2TAB EVERY EVENING. MAY TAKE ANOTHER 40 MG IN EVENING AS NEEDED. 180 tablet 1  . levothyroxine (SYNTHROID) 25 MCG tablet Take 25 mcg by mouth daily.    . Multiple Vitamin (MULTIVITAMIN WITH MINERALS) TABS tablet Take 1 tablet by mouth daily.     . potassium chloride (MICRO-K) 10 MEQ CR capsule Take 4 capsules (40 mEq total) by mouth  daily. 360 capsule 3  . Vitamin D, Ergocalciferol, (DRISDOL) 1.25 MG (50000 UNIT) CAPS capsule 1 cap(s)     No current facility-administered medications for this visit.    REVIEW OF SYSTEMS:   Review of Systems  Constitutional: Positive for occasional fatigue.  Negative for appetite change, chills, fever and unexpected weight change.  HENT: Negative for mouth sores, nosebleeds, sore throat and trouble swallowing.   Eyes: Negative for eye problems and icterus.  Respiratory: Negative for cough, hemoptysis, shortness of breath (occasional) and wheezing.   Cardiovascular: Negative for chest pain and leg swelling.  Gastrointestinal: Negative for abdominal pain, constipation, diarrhea, nausea and vomiting.  Genitourinary: Negative for bladder incontinence, difficulty urinating, dysuria, frequency and hematuria.   Musculoskeletal: Negative for back pain, gait problem, neck pain and neck stiffness.  Skin: Negative for itching and rash.  Neurological: Negative for dizziness, extremity weakness, gait problem, headaches, light-headedness and seizures.  Hematological: Negative for adenopathy. Does not bruise/bleed easily.  Psychiatric/Behavioral: Negative for confusion, depression and sleep disturbance. The patient is not nervous/anxious.     PHYSICAL EXAMINATION:  Blood pressure (!) 106/91, pulse 95, temperature 97.6 F (36.4 C), temperature source Tympanic, resp. rate 16, height 5' 8"  (1.727 m), weight 145 lb 1.6 oz (65.8 kg), SpO2 99 %.  ECOG PERFORMANCE STATUS: 0 - Asymptomatic  Physical Exam  Constitutional: Oriented to person, place, and time and well-developed, well-nourished, and in no distress.  HENT:  Head: Normocephalic and atraumatic.  Mouth/Throat: Oropharynx is clear and moist. No oropharyngeal exudate.  Eyes: Conjunctivae are normal. Right eye exhibits no discharge. Left eye exhibits no discharge. No scleral icterus.  Neck: Normal range of motion. Neck supple.  Cardiovascular:  Normal rate, irregular rhythm, and murmur noted. Intact distal pulses.   Pulmonary/Chest: Effort normal and breath sounds normal. No respiratory distress. No wheezes. No rales.  Abdominal: Soft. Bowel sounds are normal. Exhibits no distension and no mass. There is no tenderness.  Musculoskeletal: Normal range of motion. Exhibits no edema.  Lymphadenopathy:    No cervical adenopathy.  Neurological: Alert and oriented to person, place, and time. Exhibits normal muscle tone. Gait normal. Coordination normal.  Skin: Skin is warm and dry. No rash noted. Not diaphoretic. No erythema. No pallor.  Psychiatric: Mood, memory and judgment normal.  Vitals reviewed.  LABORATORY DATA: Lab Results  Component Value Date   WBC 3.2 (L) 02/06/2021   HGB 15.2 02/06/2021   HCT 46.7 02/06/2021   MCV 87.5 02/06/2021   PLT 180 02/06/2021      Chemistry      Component Value Date/Time   NA 134 (L) 02/06/2021 0900   K 3.8 02/06/2021 0900   CL 103 02/06/2021 0900   CO2 21 (L) 02/06/2021 0900   BUN 14 02/06/2021 0900   CREATININE 0.79 02/06/2021 0900      Component Value Date/Time   CALCIUM 8.5 (L) 02/06/2021 0900   ALKPHOS 235 (H) 02/06/2021 0900   AST 43 (H) 02/06/2021 0900   ALT 24 02/06/2021 0900   BILITOT 1.3 (H) 02/06/2021 0900       RADIOGRAPHIC STUDIES: No results found.  ASSESSMENT: This is a very pleasant 51 year old African-American male referred to the clinic for leukopenia.   PLAN: The patient was seen with Dr. Julien Nordmann today.  The patient had several lab studies performed including a CBC, CMP, LDH, hepatitis panel, HIV testing, folate, B12, ANA, and rheumatoid factor performed.  Dr. Earlie Server discussed that the differential for neutropenia includes reactive neutropenia, infectious etiology, nutritional etiology or medication side effect.  The patient is currently not taking any medications known to cause neutropenia.  Will await for the pending lab studies to rule out a viral or  nutritional etiology.  His CBC today shows a persistently low but improved WBC count at 3.2 today. His ANC is 1.9. Hbg and platelets are WNL. His WBC count is improved compared to the one drawn at his PCPs office on 01/30/21 which was 2.6. Dr. Julien Nordmann believes that this may be reactive secondary to his alcohol use. Dr. Julien Nordmann does not feel that the patient needs an invasive procedure at this time, such as a bone marrow biopsy and aspirate, especially because his leukopenia is improving and mild today.   The patient was instructed to reduce his alcohol intake, especially in the setting of cirrhosis. I will personally call the patient if any of the pending lab studies are abnormal.   I  will personally call the patient when we have the results of the pending lab studies.   We will see him back for a follow up visit in 3 months for evaluation and for a repeat CBC.   The patient voices understanding of current disease status and treatment options and is in agreement with the current care plan.  All questions were answered. The patient knows to call the clinic with any problems, questions or concerns. We can certainly see the patient much sooner if necessary.  Thank you so much for allowing me to participate in the care of [facility]. I will continue to follow up the patient with you and assist in his care.  Disclaimer: This note was dictated with voice recognition software. Similar sounding words can inadvertently be transcribed and may not be corrected upon review.   Dare Sanger L Yahmir Sokolov February 06, 2021, 11:22 AM  ADDENDUM: Hematology/Oncology Attending: I had a face-to-face encounter with the patient today.  I reviewed his records, labs and recommended his care plan.  This is a very pleasant 50 years old African-American male with multiple medical problems including congestive heart failure/restrictive cardiomyopathy of unclear etiology, liver cirrhosis, vitamin D deficiency,  hypothyroidism, peripheral vascular disease, hypertension, middle cerebral artery embolism, atrial flutter as well as gout, dyslipidemia and history of community-acquired pneumonia.  The patient was seen recently by his primary care physician for routine evaluation and CBC at that time showed low white blood count of 2.6.  He has normal hemoglobin, hematocrit and platelets count.  He was referred to [place] for evaluation of his condition.  His previous blood work until 5 months ago showed normal white blood count. Repeat CBC today showed white blood count of 3.2 with normal absolute neutrophil count. We will order several studies for evaluation of his underlying leukocytopenia but this is most likely could be related to his alcohol abuse as well as over-the-counter NSAIDs. The blood work ordered today including hepatitis panel, HIV, vitamin B12, and serum folate were within the normal range.  Rheumatoid factor and ANA are still pending. I recommended for the patient to cut on the alcohol drinking and NSAIDs and we will see him back for follow-up visit in 3 months for reevaluation. The patient was advised to call immediately if he has any other concerning symptoms in the interval.  The total time spent in the appointment was 60 minutes. Disclaimer: This note was dictated with voice recognition software. Similar sounding words can inadvertently be transcribed and may be missed upon review. Eilleen Kempf, MD 02/06/21

## 2021-02-06 ENCOUNTER — Other Ambulatory Visit: Payer: Self-pay

## 2021-02-06 ENCOUNTER — Other Ambulatory Visit: Payer: Self-pay | Admitting: Physician Assistant

## 2021-02-06 ENCOUNTER — Encounter: Payer: Self-pay | Admitting: Physician Assistant

## 2021-02-06 ENCOUNTER — Inpatient Hospital Stay: Payer: Medicare Other | Attending: Physician Assistant | Admitting: Physician Assistant

## 2021-02-06 ENCOUNTER — Inpatient Hospital Stay: Payer: Medicare Other

## 2021-02-06 VITALS — BP 106/91 | HR 95 | Temp 97.6°F | Resp 16 | Ht 68.0 in | Wt 145.1 lb

## 2021-02-06 DIAGNOSIS — I429 Cardiomyopathy, unspecified: Secondary | ICD-10-CM | POA: Diagnosis not present

## 2021-02-06 DIAGNOSIS — I10 Essential (primary) hypertension: Secondary | ICD-10-CM

## 2021-02-06 DIAGNOSIS — I509 Heart failure, unspecified: Secondary | ICD-10-CM | POA: Diagnosis not present

## 2021-02-06 DIAGNOSIS — Z8701 Personal history of pneumonia (recurrent): Secondary | ICD-10-CM

## 2021-02-06 DIAGNOSIS — D72819 Decreased white blood cell count, unspecified: Secondary | ICD-10-CM

## 2021-02-06 DIAGNOSIS — E039 Hypothyroidism, unspecified: Secondary | ICD-10-CM

## 2021-02-06 DIAGNOSIS — E785 Hyperlipidemia, unspecified: Secondary | ICD-10-CM | POA: Diagnosis not present

## 2021-02-06 DIAGNOSIS — I4892 Unspecified atrial flutter: Secondary | ICD-10-CM

## 2021-02-06 DIAGNOSIS — Z8673 Personal history of transient ischemic attack (TIA), and cerebral infarction without residual deficits: Secondary | ICD-10-CM

## 2021-02-06 DIAGNOSIS — I739 Peripheral vascular disease, unspecified: Secondary | ICD-10-CM | POA: Diagnosis not present

## 2021-02-06 DIAGNOSIS — K7460 Unspecified cirrhosis of liver: Secondary | ICD-10-CM | POA: Diagnosis not present

## 2021-02-06 DIAGNOSIS — F1010 Alcohol abuse, uncomplicated: Secondary | ICD-10-CM

## 2021-02-06 DIAGNOSIS — M109 Gout, unspecified: Secondary | ICD-10-CM | POA: Diagnosis not present

## 2021-02-06 DIAGNOSIS — E559 Vitamin D deficiency, unspecified: Secondary | ICD-10-CM | POA: Diagnosis not present

## 2021-02-06 LAB — VITAMIN B12: Vitamin B-12: 1000 pg/mL — ABNORMAL HIGH (ref 180–914)

## 2021-02-06 LAB — CMP (CANCER CENTER ONLY)
ALT: 24 U/L (ref 0–44)
AST: 43 U/L — ABNORMAL HIGH (ref 15–41)
Albumin: 2.9 g/dL — ABNORMAL LOW (ref 3.5–5.0)
Alkaline Phosphatase: 235 U/L — ABNORMAL HIGH (ref 38–126)
Anion gap: 10 (ref 5–15)
BUN: 14 mg/dL (ref 6–20)
CO2: 21 mmol/L — ABNORMAL LOW (ref 22–32)
Calcium: 8.5 mg/dL — ABNORMAL LOW (ref 8.9–10.3)
Chloride: 103 mmol/L (ref 98–111)
Creatinine: 0.79 mg/dL (ref 0.61–1.24)
GFR, Estimated: >60 mL/min (ref >60)
Glucose, Bld: 96 mg/dL (ref 70–99)
Potassium: 3.8 mmol/L (ref 3.5–5.1)
Sodium: 134 mmol/L — ABNORMAL LOW (ref 135–145)
Total Bilirubin: 1.3 mg/dL — ABNORMAL HIGH (ref 0.3–1.2)
Total Protein: 7.4 g/dL (ref 6.5–8.1)

## 2021-02-06 LAB — CBC WITH DIFFERENTIAL (CANCER CENTER ONLY)
Abs Immature Granulocytes: 0 10*3/uL (ref 0.00–0.07)
Basophils Absolute: 0.1 10*3/uL (ref 0.0–0.1)
Basophils Relative: 2 %
Eosinophils Absolute: 0 10*3/uL (ref 0.0–0.5)
Eosinophils Relative: 1 %
HCT: 46.7 % (ref 39.0–52.0)
Hemoglobin: 15.2 g/dL (ref 13.0–17.0)
Immature Granulocytes: 0 %
Lymphocytes Relative: 31 %
Lymphs Abs: 1 10*3/uL (ref 0.7–4.0)
MCH: 28.5 pg (ref 26.0–34.0)
MCHC: 32.5 g/dL (ref 30.0–36.0)
MCV: 87.5 fL (ref 80.0–100.0)
Monocytes Absolute: 0.3 10*3/uL (ref 0.1–1.0)
Monocytes Relative: 9 %
Neutro Abs: 1.9 10*3/uL (ref 1.7–7.7)
Neutrophils Relative %: 57 %
Platelet Count: 180 10*3/uL (ref 150–400)
RBC: 5.34 MIL/uL (ref 4.22–5.81)
RDW: 14 % (ref 11.5–15.5)
WBC Count: 3.2 10*3/uL — ABNORMAL LOW (ref 4.0–10.5)
nRBC: 0 % (ref 0.0–0.2)

## 2021-02-06 LAB — HEPATITIS PANEL, ACUTE
HCV Ab: NONREACTIVE
Hep A IgM: NONREACTIVE
Hep B C IgM: NONREACTIVE
Hepatitis B Surface Ag: NONREACTIVE

## 2021-02-06 LAB — HIV ANTIBODY (ROUTINE TESTING W REFLEX): HIV Screen 4th Generation wRfx: NONREACTIVE

## 2021-02-06 LAB — FOLATE: Folate: 14.4 ng/mL (ref >5.9)

## 2021-02-06 LAB — LACTATE DEHYDROGENASE: LDH: 222 U/L — ABNORMAL HIGH (ref 98–192)

## 2021-02-06 NOTE — Patient Instructions (Signed)
Neutropenia Neutropenia is a condition that occurs when you have a lower-than-normal level of a type of white blood cell (neutrophil) in your body. Neutrophils are made in the spongy center of large bones (bone marrow), and they fight infections. Neutrophils are your body's main defense against bacterial and fungal infections. The fewer neutrophils you have and the longer your body remains without them, the greater your risk of getting a severe infection. What are the causes? This condition can occur if your body uses up or destroys neutrophils faster than your bone marrow can make them. Neutropenia may be caused by:  A bacterial or fungal infection.  Allergic disorders.  Reactions to some medicines.  An autoimmune disease.  An enlarged spleen. This condition can also occur if your bone marrow does not produce enough neutrophils. This problem may be caused by:  Cancer.  Cancer treatments, such as radiation or chemotherapy.  Viral infections.  Medicines, such as phenytoin.  Vitamin B12 deficiency.  Diseases of the bone marrow.  Environmental toxins, such as insecticides. What are the signs or symptoms? This condition does not usually cause symptoms. If symptoms are present, they are usually caused by an underlying infection. Symptoms of an infection may include:  Fever.  Chills.  Swollen glands.  Oral or anal ulcers.  Cough and shortness of breath.  Rash.  Skin infection.  Fatigue. How is this diagnosed? Your health care provider may suspect neutropenia if you have:  A condition that may cause neutropenia.  Symptoms during or after treatment for cancer.  Symptoms of infection, especially fever.  Frequent and unusual infections. This condition is diagnosed based on your medical history and a physical exam. Tests will also be done, such as:  A complete blood count (CBC).  A procedure to collect a sample of bone marrow for examination (bone marrow  biopsy).  A chest X-ray.  A urine culture.  A blood culture. How is this treated? Treatment depends on the underlying cause and severity of your condition. Mild neutropenia may not require treatment. Treatment may include medicines, such as:  Antibiotic medicine given through an IV.  Antiviral medicines.  Antifungal medicines.  A medicine to increase neutrophil production (colony-stimulating factor). You may get this drug through an IV or by injection.  Steroids given through an IV. If an underlying condition is causing neutropenia, you may need treatment for that condition. If medicines or cancer treatments are causing neutropenia, your health care provider may have you stop the medicines or treatment. Follow these instructions at home: Medicines  Take over-the-counter and prescription medicines only as told by your health care provider.  Get a seasonal flu shot (influenza vaccine).  Avoid people who received a vaccine in the past 30 days if that vaccine contained a live version of the germ (live vaccine). You should not get a live vaccine. Common live vaccines are polio, MMR, chicken pox, and shingles vaccines.   Eating and drinking  Do not share food utensils.  Do not eat unpasteurized foods.  Do not eat raw or undercooked meat, eggs, or seafood.  Do not eat unwashed, raw fruits or vegetables. Lifestyle  Avoid exposure to groups of people or children.  Avoid being around people who are sick.  Avoid being around dirt or dust, such as in construction areas or gardens.  Do not provide direct care for pets. Avoid animal droppings. Do not clean litter boxes and bird cages.  Do not have sex unless your health care provider has approved. Hygiene    Bathe daily.  Clean the area between the genitals and the anus (perineal area) after you urinate or have a bowel movement. If you are male, wipe from front to back.  Brush your teeth with a soft toothbrush before and after  meals.  Do not use a regular razor. Use an electric razor to remove hair.  Wash your hands often. Make sure others who come in contact with you also wash their hands. If soap and water are not available, use hand sanitizer.   General instructions  Follow any precautions as told by your health care provider to reduce your risk for injury or infection.  Take actions to avoid cuts and burns. For example: ? Be cautious when you use knives. Always cut away from yourself. ? Keep knives in protective sheaths or guards when not in use. ? Use oven mitts when you cook with a hot stove, oven, or grill. ? Stand a safe distance away from open fires.  Do not use tampons, enemas, or rectal suppositories unless your health care provider has approved.  Keep all follow-up visits as told by your health care provider. This is important. Contact a health care provider if:  You have: ? A sore throat. ? A warm, red, or tender area on your skin. ? A cough. ? Frequent or painful urination. ? Vaginal discharge or itching.  You develop: ? Sores in your mouth or anus. ? Swollen lymph nodes. ? Red streaks on the skin. ? A rash. Get help right away if:  You have: ? A fever. ? Chills, or you start to shake.  You feel: ? Nauseous, or you vomit. ? Very fatigued. ? Short of breath. Summary  Neutropenia is a condition that occurs when you have a lower-than-normal level of a type of white blood cell (neutrophil) in your body.  This condition can occur if your body uses up or destroys neutrophils faster than your bone marrow can make them.  Treatment depends on the underlying cause and severity of your condition. Mild neutropenia may not require treatment.  Follow any precautions as told by your health care provider to reduce your risk for injury or infection. This information is not intended to replace advice given to you by your health care provider. Make sure you discuss any questions you have with  your health care provider. Document Revised: 07/31/2018 Document Reviewed: 07/31/2018 Elsevier Patient Education  2021 [employer] Inc.  

## 2021-02-07 ENCOUNTER — Telehealth: Payer: Self-pay | Admitting: Internal Medicine

## 2021-02-07 LAB — RHEUMATOID FACTOR: Rheumatoid fact SerPl-aCnc: <10 IU/mL (ref <14.0)

## 2021-02-07 NOTE — Telephone Encounter (Signed)
Scheduled per los. Called and spoke with patient. Confirmed appt 

## 2021-02-08 ENCOUNTER — Telehealth: Payer: Self-pay

## 2021-02-08 LAB — FANA STAINING PATTERNS
Nuclear Dot Pattern: 67 — ABNORMAL HIGH
Speckled Pattern: 24529

## 2021-02-08 LAB — ANTINUCLEAR ANTIBODIES, IFA: ANA Ab, IFA: POSITIVE — AB

## 2021-02-08 NOTE — Telephone Encounter (Signed)
Lab results for positive ANA and nuclear dot pattern has been faxed to dr. Kathie Rhodes an [facility].  I spoke with Lattie Haw, nurse for Dr. Manson Allan who advised to fax results to them at [phone]. Fax confirmation was received.

## 2021-02-09 ENCOUNTER — Telehealth: Payer: Self-pay | Admitting: Physician Assistant

## 2021-02-09 NOTE — Telephone Encounter (Signed)
I called the patient to review his lab results. I reviewed with him that his viral panels, RF, and labs to assess for vitamin deficiencies were all unremarkable. However, the patient has been following with [facility] gastroenterology for cirrhosis. Per chart review they have done extensive testing for an etiology of his cirrhosis. On his ANA test that was performed by our office, his ANA and FANA staining patterned showed specked pattern as well as nuclear dot pattern. The nuclear dot pattern is new and can be associated with primary biliary cirrhosis, although his AMA test was negative by GI in the past. We will reach out to Dr. Louann Sjogren office and fax over the results. We will defer to them whether this warrants further testing or repeat testing. I discussed  this with the patient and he expressed understanding.

## 2021-03-16 ENCOUNTER — Encounter (HOSPITAL_COMMUNITY): Payer: Self-pay | Admitting: Internal Medicine

## 2021-03-16 ENCOUNTER — Other Ambulatory Visit: Payer: Self-pay

## 2021-03-16 ENCOUNTER — Ambulatory Visit (HOSPITAL_COMMUNITY)
Admission: RE | Admit: 2021-03-16 | Discharge: 2021-03-16 | Disposition: A | Discharge status: Home / Self Care | Payer: Medicare Other | Source: Ambulatory Visit | Attending: Internal Medicine | Admitting: Internal Medicine

## 2021-03-16 ENCOUNTER — Other Ambulatory Visit (HOSPITAL_COMMUNITY): Payer: Self-pay

## 2021-03-16 VITALS — BP 92/60 | HR 71 | Wt 151.4 lb

## 2021-03-16 DIAGNOSIS — I4510 Unspecified right bundle-branch block: Secondary | ICD-10-CM | POA: Diagnosis not present

## 2021-03-16 DIAGNOSIS — I425 Other restrictive cardiomyopathy: Secondary | ICD-10-CM | POA: Diagnosis not present

## 2021-03-16 DIAGNOSIS — I493 Ventricular premature depolarization: Secondary | ICD-10-CM | POA: Diagnosis not present

## 2021-03-16 DIAGNOSIS — I5032 Chronic diastolic (congestive) heart failure: Secondary | ICD-10-CM | POA: Diagnosis not present

## 2021-03-16 DIAGNOSIS — I4821 Permanent atrial fibrillation: Secondary | ICD-10-CM | POA: Diagnosis not present

## 2021-03-16 DIAGNOSIS — I517 Cardiomegaly: Secondary | ICD-10-CM | POA: Insufficient documentation

## 2021-03-16 LAB — COMPREHENSIVE METABOLIC PANEL
ALT: 21 U/L (ref 0–44)
AST: 47 U/L — ABNORMAL HIGH (ref 15–41)
Albumin: 2.5 g/dL — ABNORMAL LOW (ref 3.5–5.0)
Alkaline Phosphatase: 183 U/L — ABNORMAL HIGH (ref 38–126)
Anion gap: 7 (ref 5–15)
BUN: 12 mg/dL (ref 6–20)
CO2: 24 mmol/L (ref 22–32)
Calcium: 8.4 mg/dL — ABNORMAL LOW (ref 8.9–10.3)
Chloride: 102 mmol/L (ref 98–111)
Creatinine, Ser: 0.8 mg/dL (ref 0.61–1.24)
GFR, Estimated: >60 mL/min (ref >60)
Glucose, Bld: 112 mg/dL — ABNORMAL HIGH (ref 70–99)
Potassium: 4.1 mmol/L (ref 3.5–5.1)
Sodium: 133 mmol/L — ABNORMAL LOW (ref 135–145)
Total Bilirubin: 1.1 mg/dL (ref 0.3–1.2)
Total Protein: 6.8 g/dL (ref 6.5–8.1)

## 2021-03-16 LAB — CBC
HCT: 41.1 % (ref 39.0–52.0)
Hemoglobin: 13.4 g/dL (ref 13.0–17.0)
MCH: 29.3 pg (ref 26.0–34.0)
MCHC: 32.6 g/dL (ref 30.0–36.0)
MCV: 89.7 fL (ref 80.0–100.0)
Platelets: 468 10*3/uL — ABNORMAL HIGH (ref 150–400)
RBC: 4.58 MIL/uL (ref 4.22–5.81)
RDW: 14 % (ref 11.5–15.5)
WBC: 3.4 10*3/uL — ABNORMAL LOW (ref 4.0–10.5)
nRBC: 0 % (ref 0.0–0.2)

## 2021-03-16 LAB — BRAIN NATRIURETIC PEPTIDE: B Natriuretic Peptide: 904.8 pg/mL — ABNORMAL HIGH (ref 0.0–100.0)

## 2021-03-16 MED ORDER — DAPAGLIFLOZIN PROPANEDIOL 10 MG PO TABS
10.0000 mg | ORAL_TABLET | Freq: Every day | ORAL | 3 refills | Status: DC
Start: 2021-03-16 — End: 2021-04-11

## 2021-03-16 NOTE — Progress Notes (Signed)
Patient ID: Charles Hawkins, male   DOB: [birth year], 50 y.o.   MRN: [id number]    ADVANCED HF CLINIC CONSULT NOTE  HF: Dr Haroldine Laws   HPI: Charles Hawkins is a 50 y.o. male with h/o HTN, restrictive CMP, Afib, h/o bilateral club feet s/p surgical repair, gout, arthritis, h/o CVA on Eliquis, and chronic pain.   Son had heart transplant at 35 age 24 months old due to reported infiltrative CM (exact diagnosis not reported in records I have). Apparently after that, [name] underwent cardiac workup and cMRI suggested infiltrative CM with normal EF. Has had exhaustive w/u since that time under the guidance of Dr. Loyal Buba at [facility] without clear diagnosis. 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 Wife got job in [employer] with [employer] and [place] now [place]. He denies FHx of HF except for his son  Saw Dr. Amalia Hailey in hematology and [name] in Rheumatology and he was apparently told he did not have a systemic problem at that time.   Seen for Acute CVA in 7/19. He had only missed one dose of Eliquis as outpatient. Had mild left facial droop as only persistent deficit. Was supposed to follow up with Dr Posey Pronto at [facility] in the transplant center, but has moved to [facility] so never did.   Echo 10/21 EF 60% moderate RV HK. RVSP 34mmHG. + MVP with mild to moderate MR Personally reviewed  He returns today for HF follow up. Has not been at [facility] in a long time. Says he feels pretty good. Can do most ADLs without problems. Just takes his time. Occasional LE edema due to eating salt. Takes lasix 40/20 most days. No dizziness. Takes Eliquis bid. No bleeding    Studies:   - EM biopsy 11/14 - negative - Fat pad biopsy - neagtive - Serologies all normal  - Event monitor 5/15; SR with PVCs  - Repeat cMRI in 5/15      1. Normal LV size and systolic function, EF 78%. Mild LV     hypertrophy. Normal RV size and systolic function.    2. Moderate to severe biatrial enlargement.    3. Evidence for  diffuse infiltrative process by delayed enhancement     imaging, possible cardiac amyloidosis.  CPX 4/15  Resting HR: 75 Peak HR: 130 (72% age predicted max HR) BP rest: 102/58 BP peak: 128/62 Peak VO2: 13.9 (38.8% predicted peak VO2) VE/VCO2 slope: 41.4 OUES: 1.09 Peak RER: 1.13 Ventilatory Threshold: 10.2 (28.5% predicted peak VO2) Peak RR 32 Peak Ventilation: 46.3 VE/MVV: 32.2% PETCO2 at peak: 26 O2pulse: 8 (57% predicted O2pulse) - Echo (2/16) with EF 35-40%, thick LV and RV with starry night pattern, moderately hypokinetic RV, severe biatrial enlargement.   Event monitor showing multiple runs of atrial tach/flutter 150-160s. He was started on amiodarone and apixaban.   Review of systems complete and found to be negative unless listed in HPI.   Current Outpatient Medications on File Prior to Encounter  Medication Sig Dispense Refill  . atorvastatin (LIPITOR) 20 MG tablet Take 1 tablet by mouth.    Charles Hawkins 5 MG TABS tablet TAKE 1 TABLET (5 MG TOTAL) BY MOUTH 2 (TWO) TIMES DAILY. 60 tablet 5  . Ensure (ENSURE) Take 237 mLs by mouth as needed.    . furosemide (LASIX) 80 MG tablet TAKE 1 TABLET EVERY MORNING AND 1/2TAB EVERY EVENING. MAY TAKE ANOTHER 40 MG IN EVENING AS NEEDED. 180 tablet 1  . levothyroxine (SYNTHROID) 25 MCG tablet  Take 25 mcg by mouth daily.    . Multiple Vitamin (MULTIVITAMIN WITH MINERALS) TABS tablet Take 1 tablet by mouth daily.     . potassium chloride (MICRO-K) 10 MEQ CR capsule Take 4 capsules (40 mEq total) by mouth daily. 360 capsule 3  . VITAMIN D, CHOLECALCIFEROL, PO Take 1 tablet by mouth once a week.     No current facility-administered medications on file prior to encounter.    PHYSICAL EXAM: Vitals:   03/16/21 1150  BP: 92/60  Pulse: 71  SpO2: 96%  Weight: 68.7 kg (151 lb 6.4 oz)   Wt Readings from Last 3 Encounters:  03/16/21 68.7 kg (151 lb 6.4 oz)  02/06/21 65.8 kg (145 lb 1.6 oz)  08/12/20 66.4 kg (146 lb 6.4 oz)   General:   Well appearing. No resp difficulty HEENT: normal Neck: supple. no JVD. Carotids 2+ bilat; no bruits. No lymphadenopathy or thryomegaly appreciated. Cor: PMI nondisplaced. Irregular rate & rhythm. 2/6 MR/TR Lungs: clear Abdomen: soft, nontender, nondistended. No hepatosplenomegaly. No bruits or masses. Good bowel sounds. Extremities: no cyanosis, clubbing, rash, edema Neuro: alert & orientedx3, cranial nerves grossly intact. moves all 4 extremities w/o difficulty. Affect pleasant    ECG: AF 84  No PVC. Personally reviewed   ASSESSMENT & PLAN:  1. 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  -cMRI 5/15Normal LV size and systolic function, EF 24%. Mild LV hypertrophy. Normal RV size and systolic function,moderate to severe biatrial enlargement,Evidence for diffuse infiltrative process by delayed enhancementimaging, possible cardiac amyloidosis. - EM biopsy 2/14. Nonspecific myocardial degeneration. -Followed by [facility] for possible transplant. He has had transplant evaluation and was not listed due to tobacco and THC use. -Last seen 11/2017 by [facility]. NYHA II at that time counseled to stop THC use and f/u yearly. He has not followed up  - Echo 05/19/18 EF 55-60%. - Echo 10/21 EF 60% moderate RV HK. RVSP 67mmHG. + MVP with mild to moderate MR Personally reviewed - Overall stable NYHA II-III  - Volume status ok to slightly elevated - Continue lasix 80/40 - Stopped spiro due to grogginess - Previously onentrestobut stoppeddue to intolerance. - Has previously refused coreg despite urgings by myself and Dr. Posey Pronto  - Start Farxiga 10 - Long talk today about the fact that his heart is not normal and more than likely may need advanced therapies in the future. We also discussed the need for GDMT but he was resistant. Agreed to [place] -  Will plan repeat CPX to better gauge where he is at and further discuss trajectory.  2.CVA7/2019 -He underwent emergent CTA7/201found a right M1 MCA occlusion consistent with an embolic stroke.Underwentendovascular revascularization of the rightMCAwith x 1 pass with 76mm x 32mm embotrap retrieve device achieving a TICI 3reperfusion. - doing great. No residual - Continue Eliquis. - Continue atorvastatin  3. A fibrillation/Flutter, chronic - Remains in Afib/Flutter. Rate controlled - Has previously been inA flutterand discussedA flutter ablationbut did not go to appt. - Continue eliquis 5 mg BID. Denies bleeding  4. Frequent PVCs - Previously taken off amio for afib due to low BP and hypothyroidism. No PVCs on ECG today   Glori Bickers MD 03/16/2021

## 2021-03-16 NOTE — Patient Instructions (Signed)
Start Farxiga 10 mg Daily  Labs done today, your results will be available in MyChart, we will contact you for abnormal readings.  Your physician has recommended that you have a cardiopulmonary stress test (CPX). CPX testing is a non-invasive measurement of heart and lung function. It replaces a traditional treadmill stress test. This type of test provides a tremendous amount of information that relates not only to your present condition but also for future outcomes. This test combines measurements of you ventilation, respiratory gas exchange in the lungs, electrocardiogram (EKG), blood pressure and physical response before, during, and following an exercise protocol.  Your physician recommends that you schedule a follow-up appointment in: 3 months  If you have any questions or concerns before your next appointment please send [place] a message through [place] or call our office at [phone].    TO LEAVE A MESSAGE FOR THE NURSE SELECT OPTION 2, PLEASE LEAVE A MESSAGE INCLUDING: . YOUR NAME . DATE OF BIRTH . CALL BACK NUMBER . REASON FOR CALL**this is important as we prioritize the call backs  [place] AS LONG AS YOU CALL BEFORE 4:00 PM  At the [facility], you and your health needs are our priority. As part of our continuing mission to provide you with exceptional heart care, we have created designated Provider Care Teams. These Care Teams include your primary Cardiologist (physician) and Advanced Practice Providers (APPs- Physician Assistants and Nurse Practitioners) who all work together to provide you with the care you need, when you need it.   You may see any of the following providers on your designated Care Team at your next follow up: Marland Kitchen Dr Glori Bickers . Dr Loralie Champagne . Dr Vickki Muff . Darrick Grinder, NP . Lyda Jester, [place] . Audry Riles, PharmD   Please be sure to bring in all your medications  bottles to every appointment.

## 2021-03-31 DIAGNOSIS — I10 Essential (primary) hypertension: Secondary | ICD-10-CM | POA: Diagnosis not present

## 2021-03-31 DIAGNOSIS — Z0001 Encounter for general adult medical examination with abnormal findings: Secondary | ICD-10-CM | POA: Diagnosis not present

## 2021-03-31 DIAGNOSIS — I425 Other restrictive cardiomyopathy: Secondary | ICD-10-CM | POA: Diagnosis not present

## 2021-03-31 DIAGNOSIS — E038 Other specified hypothyroidism: Secondary | ICD-10-CM | POA: Diagnosis not present

## 2021-03-31 DIAGNOSIS — Z720 Tobacco use: Secondary | ICD-10-CM | POA: Diagnosis not present

## 2021-03-31 DIAGNOSIS — M8588 Other specified disorders of bone density and structure, other site: Secondary | ICD-10-CM | POA: Diagnosis not present

## 2021-03-31 DIAGNOSIS — Z7901 Long term (current) use of anticoagulants: Secondary | ICD-10-CM | POA: Diagnosis not present

## 2021-03-31 DIAGNOSIS — I5020 Unspecified systolic (congestive) heart failure: Secondary | ICD-10-CM | POA: Diagnosis not present

## 2021-03-31 DIAGNOSIS — R7303 Prediabetes: Secondary | ICD-10-CM | POA: Diagnosis not present

## 2021-04-11 ENCOUNTER — Other Ambulatory Visit (HOSPITAL_COMMUNITY): Payer: Self-pay | Admitting: *Deleted

## 2021-04-11 DIAGNOSIS — I425 Other restrictive cardiomyopathy: Secondary | ICD-10-CM

## 2021-04-11 DIAGNOSIS — I5032 Chronic diastolic (congestive) heart failure: Secondary | ICD-10-CM

## 2021-04-11 MED ORDER — DAPAGLIFLOZIN PROPANEDIOL 10 MG PO TABS: 10.0000 mg | ORAL_TABLET | Freq: Every day | ORAL | 3 refills | Status: DC

## 2021-04-26 ENCOUNTER — Other Ambulatory Visit (HOSPITAL_COMMUNITY): Payer: Self-pay | Admitting: Internal Medicine

## 2021-05-08 ENCOUNTER — Telehealth: Payer: Self-pay | Admitting: Internal Medicine

## 2021-05-08 ENCOUNTER — Other Ambulatory Visit: Payer: Self-pay | Admitting: Physician Assistant

## 2021-05-08 ENCOUNTER — Other Ambulatory Visit: Payer: Self-pay

## 2021-05-08 ENCOUNTER — Inpatient Hospital Stay: Payer: Medicare Other

## 2021-05-08 ENCOUNTER — Inpatient Hospital Stay: Payer: Medicare Other | Attending: Physician Assistant | Admitting: Internal Medicine

## 2021-05-08 VITALS — BP 90/80 | HR 81 | Temp 98.1°F | Resp 18 | Ht 68.0 in | Wt 150.5 lb

## 2021-05-08 DIAGNOSIS — D72819 Decreased white blood cell count, unspecified: Secondary | ICD-10-CM | POA: Insufficient documentation

## 2021-05-08 DIAGNOSIS — R760 Raised antibody titer: Secondary | ICD-10-CM | POA: Diagnosis not present

## 2021-05-08 LAB — CBC WITH DIFFERENTIAL (CANCER CENTER ONLY)
Abs Immature Granulocytes: 0 10*3/uL (ref 0.00–0.07)
Basophils Absolute: 0 10*3/uL (ref 0.0–0.1)
Basophils Relative: 1 %
Eosinophils Absolute: 0 10*3/uL (ref 0.0–0.5)
Eosinophils Relative: 1 %
HCT: 40.4 % (ref 39.0–52.0)
Hemoglobin: 13.3 g/dL (ref 13.0–17.0)
Immature Granulocytes: 0 %
Lymphocytes Relative: 41 %
Lymphs Abs: 1.2 10*3/uL (ref 0.7–4.0)
MCH: 28.7 pg (ref 26.0–34.0)
MCHC: 32.9 g/dL (ref 30.0–36.0)
MCV: 87.3 fL (ref 80.0–100.0)
Monocytes Absolute: 0.4 10*3/uL (ref 0.1–1.0)
Monocytes Relative: 16 %
Neutro Abs: 1.2 10*3/uL — ABNORMAL LOW (ref 1.7–7.7)
Neutrophils Relative %: 41 %
Platelet Count: 205 10*3/uL (ref 150–400)
RBC: 4.63 MIL/uL (ref 4.22–5.81)
RDW: 15.9 % — ABNORMAL HIGH (ref 11.5–15.5)
WBC Count: 2.8 10*3/uL — ABNORMAL LOW (ref 4.0–10.5)
nRBC: 0 % (ref 0.0–0.2)

## 2021-05-08 NOTE — Progress Notes (Signed)
[place] [phone]   [phone]  OFFICE PROGRESS NOTE  Charles Mccreedy, MD [address] [place] [address]  DIAGNOSIS: leukocytopenia but this is most likely could be related to his alcohol abuse as well as over-the-counter NSAIDs.  PRIOR THERAPY: None  CURRENT THERAPY: Observation  INTERVAL HISTORY: Charles Hawkins 50 y.o. male returns to the clinic today for follow-up visit.  The patient is feeling fine today with no concerning complaints.  He had extensive work-up for his leukocytopenia and they were unremarkable except for elevated ANA titers and he was seen by rheumatology in the past.  The patient denied having any current chest pain, shortness of breath, cough or hemoptysis.  He denied having any fever or chills.  He has no nausea, vomiting, diarrhea or constipation.  He has no headache or visual changes.  He continues to drink alcohol at regular basis but he is not taking any more NSAIDs.  He is here today for evaluation and repeat CBC.   MEDICAL HISTORY: Past Medical History:  Diagnosis Date   Arthritis    CHF (congestive heart failure) (HCC)    HTN (hypertension)    Infiltrative cardiomyopathy (HCC)    EF normal. Multiple areas of subendocardial enhancement on MRI. Fat pad and myocardial biopsy negative at [facility].    Stroke [facility])     ALLERGIES:  is allergic to lyrica [pregabalin] and nsaids.  MEDICATIONS:  Current Outpatient Medications  Medication Sig Dispense Refill   atorvastatin (LIPITOR) 20 MG tablet Take 1 tablet by mouth.     dapagliflozin propanediol (FARXIGA) 10 MG TABS tablet Take 1 tablet (10 mg total) by mouth daily before breakfast. 90 tablet 3   ELIQUIS 5 MG TABS tablet TAKE 1 TABLET (5 MG TOTAL) BY MOUTH 2 (TWO) TIMES DAILY. 60 tablet 5   Ensure (ENSURE) Take 237 mLs by mouth as needed.     furosemide (LASIX) 80 MG tablet TAKE 1 TABLET BY MOUTH IN  THE MORNING AND 1/2 TAB IN  THE EVENING .  MAY TAKE  ANOTHER 1/2 TABLET IN THE  EVENING AS NEEDED 126 tablet 0   levothyroxine (SYNTHROID) 25 MCG tablet Take 25 mcg by mouth daily.     Multiple Vitamin (MULTIVITAMIN WITH MINERALS) TABS tablet Take 1 tablet by mouth daily.      potassium chloride (MICRO-K) 10 MEQ CR capsule Take 4 capsules (40 mEq total) by mouth daily. 360 capsule 3   VITAMIN D, CHOLECALCIFEROL, PO Take 1 tablet by mouth once a week.     No current facility-administered medications for this visit.    SURGICAL HISTORY:  Past Surgical History:  Procedure Laterality Date   IR ANGIO VERTEBRAL SEL SUBCLAVIAN INNOMINATE UNI R MOD SED  05/18/2018   IR CT HEAD LTD  05/18/2018   IR PERCUTANEOUS ART THROMBECTOMY/INFUSION INTRACRANIAL INC DIAG ANGIO  05/18/2018   RADIOLOGY WITH ANESTHESIA N/A 05/18/2018   Procedure: RADIOLOGY WITH ANESTHESIA;  Surgeon: Luanne Bras, MD;  Location: [place];  Service: Radiology;  Laterality: N/A;    REVIEW OF SYSTEMS:  A comprehensive review of systems was negative.   PHYSICAL EXAMINATION: General appearance: alert, cooperative, and no distress Head: Normocephalic, without obvious abnormality, atraumatic Neck: no adenopathy, no JVD, supple, symmetrical, trachea midline, and thyroid not enlarged, symmetric, no tenderness/mass/nodules Lymph nodes: Cervical, supraclavicular, and axillary nodes normal. Resp: clear to auscultation bilaterally Back: symmetric, no curvature. ROM normal. No CVA tenderness. Cardio: regular rate and rhythm,  S1, S2 normal, no murmur, click, rub or gallop GI: soft, non-tender; bowel sounds normal; no masses,  no organomegaly Extremities: extremities normal, atraumatic, no cyanosis or edema  ECOG PERFORMANCE STATUS: 1 - Symptomatic but completely ambulatory  Blood pressure 90/80, pulse 81, temperature 98.1 F (36.7 C), resp. rate 18, height _0  (1.727 m), weight 150 lb 8 oz (68.3 kg), SpO2 100 %.  LABORATORY DATA: Lab Results  Component Value Date   WBC 2.8  (L) 05/08/2021   HGB 13.3 05/08/2021   HCT 40.4 05/08/2021   MCV 87.3 05/08/2021   PLT 205 05/08/2021      Chemistry      Component Value Date/Time   NA 133 (L) 03/16/2021 1242   K 4.1 03/16/2021 1242   CL 102 03/16/2021 1242   CO2 24 03/16/2021 1242   BUN 12 03/16/2021 1242   CREATININE 0.80 03/16/2021 1242   CREATININE 0.79 02/06/2021 0900      Component Value Date/Time   CALCIUM 8.4 (L) 03/16/2021 1242   ALKPHOS 183 (H) 03/16/2021 1242   AST 47 (H) 03/16/2021 1242   AST 43 (H) 02/06/2021 0900   ALT 21 03/16/2021 1242   ALT 24 02/06/2021 0900   BILITOT 1.1 03/16/2021 1242   BILITOT 1.3 (H) 02/06/2021 0900       RADIOGRAPHIC STUDIES: No results found.  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 The patient had extensive work-up in the past that was unremarkable except for positive ANA and he is followed by rheumatology. I recommended for the patient to proceed with a bone marrow biopsy and aspirate to rule out any underlying bone marrow abnormalities that could be contributing to his leukocytopenia. I will see the patient back for follow-up visit in around 3 weeks for evaluation and discussion of his biopsy results and further recommendation regarding his condition. I strongly encouraged the patient to quit alcohol drinking. He was advised to call immediately if he has any other concerning symptoms in the interval. The patient voices understanding of current disease status and treatment options and is in agreement with the current care plan.  All questions were answered. The patient knows to call the clinic with any problems, questions or concerns. We can certainly see the patient much sooner if necessary.   Disclaimer: This note was dictated with voice recognition software. Similar  sounding words can inadvertently be transcribed and may not be corrected upon review.

## 2021-05-08 NOTE — Telephone Encounter (Signed)
Scheduled per los. Gave avs and calendar  

## 2021-05-15 ENCOUNTER — Ambulatory Visit (HOSPITAL_COMMUNITY): Payer: Medicare Other | Attending: Internal Medicine

## 2021-05-15 ENCOUNTER — Other Ambulatory Visit: Payer: Self-pay

## 2021-05-15 DIAGNOSIS — I5032 Chronic diastolic (congestive) heart failure: Secondary | ICD-10-CM

## 2021-05-18 ENCOUNTER — Other Ambulatory Visit: Payer: Self-pay | Admitting: Student

## 2021-05-19 ENCOUNTER — Ambulatory Visit (HOSPITAL_COMMUNITY)
Admission: RE | Admit: 2021-05-19 | Discharge: 2021-05-19 | Disposition: A | Discharge status: Home / Self Care | Payer: Medicare Other | Source: Ambulatory Visit | Attending: Internal Medicine | Admitting: Internal Medicine

## 2021-05-19 ENCOUNTER — Encounter (HOSPITAL_COMMUNITY): Payer: Self-pay

## 2021-05-19 ENCOUNTER — Other Ambulatory Visit: Payer: Self-pay

## 2021-05-19 DIAGNOSIS — I428 Other cardiomyopathies: Secondary | ICD-10-CM | POA: Diagnosis not present

## 2021-05-19 DIAGNOSIS — D72829 Elevated white blood cell count, unspecified: Secondary | ICD-10-CM | POA: Insufficient documentation

## 2021-05-19 DIAGNOSIS — I509 Heart failure, unspecified: Secondary | ICD-10-CM | POA: Diagnosis not present

## 2021-05-19 DIAGNOSIS — Z794 Long term (current) use of insulin: Secondary | ICD-10-CM | POA: Insufficient documentation

## 2021-05-19 DIAGNOSIS — Z87891 Personal history of nicotine dependence: Secondary | ICD-10-CM | POA: Diagnosis not present

## 2021-05-19 DIAGNOSIS — D72819 Decreased white blood cell count, unspecified: Secondary | ICD-10-CM

## 2021-05-19 DIAGNOSIS — D72822 Plasmacytosis: Secondary | ICD-10-CM | POA: Insufficient documentation

## 2021-05-19 DIAGNOSIS — Z79899 Other long term (current) drug therapy: Secondary | ICD-10-CM | POA: Insufficient documentation

## 2021-05-19 DIAGNOSIS — I110 Hypertensive heart disease with heart failure: Secondary | ICD-10-CM | POA: Diagnosis not present

## 2021-05-19 DIAGNOSIS — Z96649 Presence of unspecified artificial hip joint: Secondary | ICD-10-CM | POA: Insufficient documentation

## 2021-05-19 DIAGNOSIS — Z8673 Personal history of transient ischemic attack (TIA), and cerebral infarction without residual deficits: Secondary | ICD-10-CM | POA: Insufficient documentation

## 2021-05-19 DIAGNOSIS — Z7984 Long term (current) use of oral hypoglycemic drugs: Secondary | ICD-10-CM | POA: Insufficient documentation

## 2021-05-19 LAB — CBC WITH DIFFERENTIAL/PLATELET
Abs Immature Granulocytes: 0.01 10*3/uL (ref 0.00–0.07)
Basophils Absolute: 0.1 10*3/uL (ref 0.0–0.1)
Basophils Relative: 2 %
Eosinophils Absolute: 0 10*3/uL (ref 0.0–0.5)
Eosinophils Relative: 1 %
HCT: 44.4 % (ref 39.0–52.0)
Hemoglobin: 14.2 g/dL (ref 13.0–17.0)
Immature Granulocytes: 0 %
Lymphocytes Relative: 52 %
Lymphs Abs: 1.5 10*3/uL (ref 0.7–4.0)
MCH: 29.2 pg (ref 26.0–34.0)
MCHC: 32 g/dL (ref 30.0–36.0)
MCV: 91.2 fL (ref 80.0–100.0)
Monocytes Absolute: 0.3 10*3/uL (ref 0.1–1.0)
Monocytes Relative: 11 %
Neutro Abs: 1 10*3/uL — ABNORMAL LOW (ref 1.7–7.7)
Neutrophils Relative %: 34 %
Platelets: 190 10*3/uL (ref 150–400)
RBC: 4.87 MIL/uL (ref 4.22–5.81)
RDW: 16.1 % — ABNORMAL HIGH (ref 11.5–15.5)
WBC: 2.9 10*3/uL — ABNORMAL LOW (ref 4.0–10.5)
nRBC: 0 % (ref 0.0–0.2)

## 2021-05-19 LAB — GLUCOSE, CAPILLARY
Glucose-Capillary: 58 mg/dL — ABNORMAL LOW (ref 70–99)
Glucose-Capillary: 65 mg/dL — ABNORMAL LOW (ref 70–99)
Glucose-Capillary: 119 mg/dL — ABNORMAL HIGH (ref 70–99)
Glucose-Capillary: 100 mg/dL — ABNORMAL HIGH (ref 70–99)

## 2021-05-19 MED ORDER — LIDOCAINE HCL (PF) 1 % IJ SOLN
INTRAMUSCULAR | Status: AC | PRN
  Administered 2021-05-19: 10 mL

## 2021-05-19 MED ORDER — FENTANYL CITRATE (PF) 100 MCG/2ML IJ SOLN
INTRAMUSCULAR | Status: AC | PRN
  Administered 2021-05-19 (×2): 50 ug via INTRAVENOUS

## 2021-05-19 MED ORDER — FENTANYL CITRATE (PF) 100 MCG/2ML IJ SOLN
INTRAMUSCULAR | Status: AC
  Filled 2021-05-19: qty 2

## 2021-05-19 MED ORDER — SODIUM CHLORIDE 0.9 % IV SOLN: INTRAVENOUS | Status: DC

## 2021-05-19 MED ORDER — MIDAZOLAM HCL 2 MG/2ML IJ SOLN
INTRAMUSCULAR | Status: AC
  Filled 2021-05-19: qty 4

## 2021-05-19 MED ORDER — DEXTROSE 50 % IV SOLN
INTRAVENOUS | Status: AC
  Filled 2021-05-19: qty 50

## 2021-05-19 MED ORDER — MIDAZOLAM HCL 2 MG/2ML IJ SOLN
INTRAMUSCULAR | Status: AC | PRN
  Administered 2021-05-19 (×2): 1 mg via INTRAVENOUS

## 2021-05-19 MED ORDER — DEXTROSE 50 % IV SOLN
1.0000 | INTRAVENOUS | Status: AC
  Administered 2021-05-19: 50 mL via INTRAVENOUS
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
## 2021-05-19 NOTE — Procedures (Signed)
Interventional Radiology Procedure Note  Procedure: CT guided aspirate and core biopsy of right iliac bone Complications: None Recommendations: - Bedrest supine x 1 hrs - Hydrocodone PRN  Pain - Follow biopsy results  Signed,  Arayah Krouse K. Danialle Dement, MD   

## 2021-05-19 NOTE — Progress Notes (Signed)
Patinet discussed new meds- checked glucose- 68.  Repeat glucose 0825- 56.  Informed PA- orders for Dextrose.   [id number]- 25cc of Dextrose given. Repeat Glucose at 0855- 119

## 2021-05-19 NOTE — Discharge Instructions (Addendum)
For questions /concerns may call Interventional Radiology at [phone]  You may remove your dressing and shower tomorrow afternoon  Moderate Conscious Sedation, Adult, Care After This sheet gives you information about how to care for yourself after your procedure. Your health care provider may also give you more specific instructions. If you have problems or questions, contact your health careprovider. What can I expect after the procedure? After the procedure, it is common to have: Sleepiness for several hours. Impaired judgment for several hours. Difficulty with balance. Vomiting if you eat too soon. Follow these instructions at home: For the time period you were told by your health care provider: Rest. Do not participate in activities where you could fall or become injured. Do not drive or use machinery. Do not drink alcohol. Do not take sleeping pills or medicines that cause drowsiness. Do not make important decisions or sign legal documents. Do not take care of children on your own. Eating and drinking  Follow the diet recommended by your health care provider. Drink enough fluid to keep your urine pale yellow. If you vomit: Drink water, juice, or soup when you can drink without vomiting. Make sure you have little or no nausea before eating solid foods.  General instructions Take over-the-counter and prescription medicines only as told by your health care provider. Have a responsible adult stay with you for the time you are told. It is important to have someone help care for you until you are awake and alert. Do not smoke. Keep all follow-up visits as told by your health care provider. This is important. Contact a health care provider if: You are still sleepy or having trouble with balance after 24 hours. You feel light-headed. You keep feeling nauseous or you keep vomiting. You develop a rash. You have a fever. You have redness or swelling around the IV site. Get help  right away if: You have trouble breathing. You have new-onset confusion at home. Summary After the procedure, it is common to feel sleepy, have impaired judgment, or feel nauseous if you eat too soon. Rest after you get home. Know the things you should not do after the procedure. Follow the diet recommended by your health care provider and drink enough fluid to keep your urine pale yellow. Get help right away if you have trouble breathing or new-onset confusion at home. This information is not intended to replace advice given to you by your health care provider. Make sure you discuss any questions you have with your    Bone Marrow Aspiration and Bone Marrow Biopsy, Adult, Care After This sheet gives you information about how to care for yourself after your procedure. Your health care provider may also give you more specific instructions. If you have problems or questions, contact your health careprovider. What can I expect after the procedure? After the procedure, it is common to have: Mild pain and tenderness. Swelling. Bruising. Follow these instructions at home: Puncture site care Follow instructions from your health care provider about how to take care of the puncture site. Make sure you: Wash your hands with soap and water before and after you change your bandage (dressing). If soap and water are not available, use hand sanitizer. Change your dressing as told by your health care provider. Check your puncture site every day for signs of infection. Check for: More redness, swelling, or pain. Fluid or blood. Warmth. Pus or a bad smell.  Activity Return to your normal activities as told by your health care provider.  Ask your health care provider what activities are safe for you. Do not lift anything that is heavier than 10 lb (4.5 kg), or the limit that you are told, until your health care provider says that it is safe. Do not drive for 24 hours if you were given a sedative during  your procedure. General instructions Take over-the-counter and prescription medicines only as told by your health care provider. Do not take baths, swim, or use a hot tub until your health care provider approves. Ask your health care provider if you may take showers. You may only be allowed to take sponge baths. If directed, put ice on the affected area. To do this: Put ice in a plastic bag. Place a towel between your skin and the bag. Leave the ice on for 20 minutes, 2-3 times a day. Keep all follow-up visits as told by your health care provider. This is important.  Contact a health care provider if: Your pain is not controlled with medicine. You have a fever. You have more redness, swelling, or pain around the puncture site. You have fluid or blood coming from the puncture site. Your puncture site feels warm to the touch. You have pus or a bad smell coming from the puncture site. Summary After the procedure, it is common to have mild pain, tenderness, swelling, and bruising. Follow instructions from your health care provider about how to take care of the puncture site and what activities are safe for you. Take over-the-counter and prescription medicines only as told by your health care provider. Contact a health care provider if you have any signs of infection, such as fluid or blood coming from the puncture site. This information is not intended to replace advice given to you by your health care provider. Make sure you discuss any questions you have with your healthcare provider. Document Revised: 03/03/2019 Document Reviewed: 03/03/2019 Elsevier Patient Education  [place].

## 2021-05-19 NOTE — Progress Notes (Addendum)
0935 patient returned from IR- post procedure.  VSS; patient very diaphoretic.  Cool cloth to neck and head.  Rechecked glucose- 107 1020 still very diaphoretic. VSS.  Somewhat sleepy from IR medications, easily arousable.     Informed PA of above info.  Changed dsg to lower back- not adhering due to diaphoresis.  Small amt of blood on dsg. PA in to see pt. No further orders.   Discharged at 1100; no diaphoresis; pt. States he feels fine; no SOB, No dizzyness / lighthead.  VSS To front entrance via wheelchair.

## 2021-05-23 DIAGNOSIS — R7303 Prediabetes: Secondary | ICD-10-CM | POA: Diagnosis not present

## 2021-05-23 DIAGNOSIS — I425 Other restrictive cardiomyopathy: Secondary | ICD-10-CM | POA: Diagnosis not present

## 2021-05-23 DIAGNOSIS — Z7901 Long term (current) use of anticoagulants: Secondary | ICD-10-CM | POA: Diagnosis not present

## 2021-05-23 DIAGNOSIS — M8588 Other specified disorders of bone density and structure, other site: Secondary | ICD-10-CM | POA: Diagnosis not present

## 2021-05-23 DIAGNOSIS — E038 Other specified hypothyroidism: Secondary | ICD-10-CM | POA: Diagnosis not present

## 2021-05-23 DIAGNOSIS — I10 Essential (primary) hypertension: Secondary | ICD-10-CM | POA: Diagnosis not present

## 2021-05-23 DIAGNOSIS — I5020 Unspecified systolic (congestive) heart failure: Secondary | ICD-10-CM | POA: Diagnosis not present

## 2021-05-23 DIAGNOSIS — Z720 Tobacco use: Secondary | ICD-10-CM | POA: Diagnosis not present

## 2021-05-24 LAB — SURGICAL PATHOLOGY

## 2021-05-29 ENCOUNTER — Encounter (HOSPITAL_COMMUNITY): Payer: Self-pay | Admitting: Internal Medicine

## 2021-05-31 ENCOUNTER — Telehealth (HOSPITAL_COMMUNITY): Payer: Self-pay

## 2021-05-31 ENCOUNTER — Inpatient Hospital Stay: Payer: Medicare Other | Attending: Physician Assistant | Admitting: Internal Medicine

## 2021-05-31 ENCOUNTER — Other Ambulatory Visit: Payer: Self-pay

## 2021-05-31 ENCOUNTER — Inpatient Hospital Stay: Payer: Medicare Other

## 2021-05-31 VITALS — BP 94/55 | HR 62 | Temp 97.9°F | Resp 18 | Ht 68.0 in | Wt 156.0 lb

## 2021-05-31 DIAGNOSIS — D72819 Decreased white blood cell count, unspecified: Secondary | ICD-10-CM | POA: Insufficient documentation

## 2021-05-31 DIAGNOSIS — F1010 Alcohol abuse, uncomplicated: Secondary | ICD-10-CM | POA: Insufficient documentation

## 2021-05-31 LAB — CBC WITH DIFFERENTIAL (CANCER CENTER ONLY)
Abs Immature Granulocytes: 0 10*3/uL (ref 0.00–0.07)
Basophils Absolute: 0 10*3/uL (ref 0.0–0.1)
Basophils Relative: 2 %
Eosinophils Absolute: 0 10*3/uL (ref 0.0–0.5)
Eosinophils Relative: 1 %
HCT: 40.1 % (ref 39.0–52.0)
Hemoglobin: 13 g/dL (ref 13.0–17.0)
Immature Granulocytes: 0 %
Lymphocytes Relative: 40 %
Lymphs Abs: 0.9 10*3/uL (ref 0.7–4.0)
MCH: 28.7 pg (ref 26.0–34.0)
MCHC: 32.4 g/dL (ref 30.0–36.0)
MCV: 88.5 fL (ref 80.0–100.0)
Monocytes Absolute: 0.3 10*3/uL (ref 0.1–1.0)
Monocytes Relative: 13 %
Neutro Abs: 1.1 10*3/uL — ABNORMAL LOW (ref 1.7–7.7)
Neutrophils Relative %: 44 %
Platelet Count: 186 10*3/uL (ref 150–400)
RBC: 4.53 MIL/uL (ref 4.22–5.81)
RDW: 15.8 % — ABNORMAL HIGH (ref 11.5–15.5)
WBC Count: 2.3 10*3/uL — ABNORMAL LOW (ref 4.0–10.5)
nRBC: 0 % (ref 0.0–0.2)

## 2021-05-31 NOTE — Telephone Encounter (Signed)
Patient called stating that he was seen a Elvina Sidle for a procedure and doctor Julien Nordmann questioned his need to be on farxiga. He states that the farxiga has made him become more short of breath over the last few months as well as dizzy. Please advise

## 2021-05-31 NOTE — Progress Notes (Signed)
[facility] [phone]   [phone]  OFFICE PROGRESS NOTE  Benito Mccreedy, MD [address] [place] [address]  DIAGNOSIS: leukocytopenia but this is most likely could be related to his alcohol abuse as well as over-the-counter NSAIDs.  PRIOR THERAPY: None  CURRENT THERAPY: Observation  INTERVAL HISTORY: Charles Hawkins 50 y.o. male returns to the clinic today for follow-up visit.  The patient is feeling fine today with no concerning complaints except for fatigue.  He has no current chest pain, shortness of breath, cough or hemoptysis.  He has no nausea, vomiting, diarrhea or constipation.  He has no headache or visual changes.  He had had a bone marrow biopsy and aspirate performed recently and he is here for evaluation and discussion of his biopsy results and recommendation regarding his leukocytopenia.  MEDICAL HISTORY: Past Medical History:  Diagnosis Date   Arthritis    CHF (congestive heart failure) (HCC)    HTN (hypertension)    Infiltrative cardiomyopathy (HCC)    EF normal. Multiple areas of subendocardial enhancement on MRI. Fat pad and myocardial biopsy negative at [facility].    Stroke [facility])     ALLERGIES:  is allergic to lyrica [pregabalin] and nsaids.  MEDICATIONS:  Current Outpatient Medications  Medication Sig Dispense Refill   atorvastatin (LIPITOR) 20 MG tablet Take 1 tablet by mouth.     dapagliflozin propanediol (FARXIGA) 10 MG TABS tablet Take 1 tablet (10 mg total) by mouth daily before breakfast. 90 tablet 3   ELIQUIS 5 MG TABS tablet TAKE 1 TABLET (5 MG TOTAL) BY MOUTH 2 (TWO) TIMES DAILY. 60 tablet 5   Ensure (ENSURE) Take 237 mLs by mouth as needed.     furosemide (LASIX) 80 MG tablet TAKE 1 TABLET BY MOUTH IN  THE MORNING AND 1/2 TAB IN  THE EVENING . MAY TAKE  ANOTHER 1/2 TABLET IN THE  EVENING AS NEEDED 126 tablet 0   levothyroxine (SYNTHROID) 25 MCG tablet Take 25 mcg by mouth daily.      Multiple Vitamin (MULTIVITAMIN WITH MINERALS) TABS tablet Take 1 tablet by mouth daily.      potassium chloride (MICRO-K) 10 MEQ CR capsule Take 4 capsules (40 mEq total) by mouth daily. 360 capsule 3   VITAMIN D, CHOLECALCIFEROL, PO Take 1 tablet by mouth once a week.     No current facility-administered medications for this visit.    SURGICAL HISTORY:  Past Surgical History:  Procedure Laterality Date   IR ANGIO VERTEBRAL SEL SUBCLAVIAN INNOMINATE UNI R MOD SED  05/18/2018   IR CT HEAD LTD  05/18/2018   IR PERCUTANEOUS ART THROMBECTOMY/INFUSION INTRACRANIAL INC DIAG ANGIO  05/18/2018   RADIOLOGY WITH ANESTHESIA N/A 05/18/2018   Procedure: RADIOLOGY WITH ANESTHESIA;  Surgeon: Luanne Bras, MD;  Location: [facility];  Service: Radiology;  Laterality: N/A;    REVIEW OF SYSTEMS:  A comprehensive review of systems was negative except for: Constitutional: positive for fatigue   PHYSICAL EXAMINATION: General appearance: alert, cooperative, fatigued, and no distress Head: Normocephalic, without obvious abnormality, atraumatic Neck: no adenopathy, no JVD, supple, symmetrical, trachea midline, and thyroid not enlarged, symmetric, no tenderness/mass/nodules Lymph nodes: Cervical, supraclavicular, and axillary nodes normal. Resp: clear to auscultation bilaterally Back: symmetric, no curvature. ROM normal. No CVA tenderness. Cardio: regular rate and rhythm, S1, S2 normal, no murmur, click, rub or gallop GI: soft, non-tender; bowel sounds normal; no masses,  no organomegaly Extremities: extremities normal, atraumatic, no  cyanosis or edema  ECOG PERFORMANCE STATUS: 1 - Symptomatic but completely ambulatory  Blood pressure (!) 94/55, pulse 62, temperature 97.9 F (36.6 C), temperature source Oral, resp. rate 18, height 5' 8"  (1.727 m), weight 156 lb (70.8 kg), SpO2 98 %.  LABORATORY DATA: Lab Results  Component Value Date   WBC 2.3 (L) 05/31/2021   HGB 13.0 05/31/2021   HCT 40.1 05/31/2021    MCV 88.5 05/31/2021   PLT 186 05/31/2021      Chemistry      Component Value Date/Time   NA 133 (L) 03/16/2021 1242   K 4.1 03/16/2021 1242   CL 102 03/16/2021 1242   CO2 24 03/16/2021 1242   BUN 12 03/16/2021 1242   CREATININE 0.80 03/16/2021 1242   CREATININE 0.79 02/06/2021 0900      Component Value Date/Time   CALCIUM 8.4 (L) 03/16/2021 1242   ALKPHOS 183 (H) 03/16/2021 1242   AST 47 (H) 03/16/2021 1242   AST 43 (H) 02/06/2021 0900   ALT 21 03/16/2021 1242   ALT 24 02/06/2021 0900   BILITOT 1.1 03/16/2021 1242   BILITOT 1.3 (H) 02/06/2021 0900       RADIOGRAPHIC STUDIES: CT Biopsy  Result Date: 20-May-2021 INDICATION: Persistent leukocytosis. EXAM: CT GUIDED BONE MARROW ASPIRATION AND CORE BIOPSY Interventional Radiologist:  Criselda Peaches, MD MEDICATIONS: None. ANESTHESIA/SEDATION: Moderate (conscious) sedation was employed during this procedure. A total of 2 milligrams versed and 100 micrograms fentanyl were administered intravenously. The patient's level of consciousness and vital signs were monitored continuously by radiology nursing throughout the procedure under my direct supervision. Total monitored sedation time: 10 minutes FLUOROSCOPY TIME:  None COMPLICATIONS: None immediate. Estimated blood loss: <25 mL PROCEDURE: Informed written consent was obtained from the patient after a thorough discussion of the procedural risks, benefits and alternatives. All questions were addressed. Maximal Sterile Barrier Technique was utilized including caps, mask, sterile gowns, sterile gloves, sterile drape, hand hygiene and skin antiseptic. A timeout was performed prior to the initiation of the procedure. The patient was positioned prone and non-contrast localization CT was performed of the pelvis to demonstrate the iliac marrow spaces. Maximal barrier sterile technique utilized including caps, mask, sterile gowns, sterile gloves, large sterile drape, hand hygiene, and betadine prep.  Under sterile conditions and local anesthesia, an 11 gauge coaxial bone biopsy needle was advanced into the right iliac marrow space. Needle position was confirmed with CT imaging. Initially, bone marrow aspiration was performed. Next, the 11 gauge outer cannula was utilized to obtain a right iliac bone marrow core biopsy. Needle was removed. Hemostasis was obtained with compression. The patient tolerated the procedure well. Samples were prepared with the cytotechnologist. IMPRESSION: Technically successful CT-guided bone marrow aspiration and core biopsy. Electronically Signed   By: Jacqulynn Cadet M.D.   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 The patient had extensive work-up in the past that was unremarkable except for positive ANA  and he is followed by rheumatology. He also underwent a bone marrow biopsy and aspirate recently.  I discussed the results with the patient.  There is no concerning findings for myelodysplastic or myeloproliferative disorder involving the bone marrow and there was mild plasmacytosis representing 4%. I recommended for the patient to continue on observation with close monitoring.  I do not see any underlying hematologic etiology for his leukocytopenia at this point and most likely secondary to medication and his history of alcohol abuse but he quit few months ago. Encouraged the patient to continue with  multivitamins on daily basis and I will see him back for follow-up visit in 6 months for evaluation and repeat blood work.  He was advised to call if he has any other concerning issues in the interval. The patient voices understanding of current disease status and treatment options and is in agreement with the current care plan.  All questions were answered. The patient knows to call the clinic with any problems, questions or concerns. We can certainly see the patient much sooner if necessary.   Disclaimer: This note was dictated with voice recognition software. Similar sounding words can inadvertently be transcribed and may not be corrected upon review.

## 2021-06-01 NOTE — Telephone Encounter (Signed)
lmtrc

## 2021-06-06 ENCOUNTER — Telehealth (HOSPITAL_COMMUNITY): Payer: Self-pay | Admitting: *Deleted

## 2021-06-06 NOTE — Telephone Encounter (Signed)
Pt called stating cancer center advised him to stop farxiga due to low bp and affecting his blood sugar. Pt wanted our providers to be aware and to see if he needed to make other med changes.   Routed to [employer] for advice

## 2021-06-16 ENCOUNTER — Other Ambulatory Visit: Payer: Self-pay

## 2021-06-16 ENCOUNTER — Encounter (HOSPITAL_COMMUNITY): Payer: Self-pay | Admitting: Internal Medicine

## 2021-06-16 ENCOUNTER — Ambulatory Visit (HOSPITAL_COMMUNITY)
Admission: RE | Admit: 2021-06-16 | Discharge: 2021-06-16 | Disposition: A | Discharge status: Home / Self Care | Payer: Medicare Other | Source: Ambulatory Visit | Attending: Internal Medicine | Admitting: Internal Medicine

## 2021-06-16 VITALS — BP 90/78 | HR 60 | Wt 151.8 lb

## 2021-06-16 DIAGNOSIS — I110 Hypertensive heart disease with heart failure: Secondary | ICD-10-CM | POA: Diagnosis not present

## 2021-06-16 DIAGNOSIS — I5032 Chronic diastolic (congestive) heart failure: Secondary | ICD-10-CM | POA: Insufficient documentation

## 2021-06-16 DIAGNOSIS — I4892 Unspecified atrial flutter: Secondary | ICD-10-CM | POA: Diagnosis not present

## 2021-06-16 DIAGNOSIS — I5042 Chronic combined systolic (congestive) and diastolic (congestive) heart failure: Secondary | ICD-10-CM | POA: Diagnosis not present

## 2021-06-16 DIAGNOSIS — Z8673 Personal history of transient ischemic attack (TIA), and cerebral infarction without residual deficits: Secondary | ICD-10-CM | POA: Insufficient documentation

## 2021-06-16 DIAGNOSIS — I4821 Permanent atrial fibrillation: Secondary | ICD-10-CM | POA: Diagnosis not present

## 2021-06-16 DIAGNOSIS — I4891 Unspecified atrial fibrillation: Secondary | ICD-10-CM | POA: Diagnosis not present

## 2021-06-16 DIAGNOSIS — Z79899 Other long term (current) drug therapy: Secondary | ICD-10-CM | POA: Insufficient documentation

## 2021-06-16 DIAGNOSIS — Z7901 Long term (current) use of anticoagulants: Secondary | ICD-10-CM | POA: Insufficient documentation

## 2021-06-16 DIAGNOSIS — I493 Ventricular premature depolarization: Secondary | ICD-10-CM | POA: Diagnosis not present

## 2021-06-16 DIAGNOSIS — I425 Other restrictive cardiomyopathy: Secondary | ICD-10-CM | POA: Diagnosis not present

## 2021-06-16 MED ORDER — DAPAGLIFLOZIN PROPANEDIOL 5 MG PO TABS: 5.0000 mg | ORAL_TABLET | Freq: Every day | ORAL | 6 refills | Status: AC

## 2021-06-16 NOTE — Progress Notes (Signed)
Patient ID: Charles Hawkins, male   DOB: [birth year], 50 y.o.   MRN: [id number]    ADVANCED HF CLINIC CONSULT NOTE  HF: Charles Hawkins   HPI: Charles Hawkins is a 50 y.o. 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  Son had heart transplant at 87 age 58 months old due to reported infiltrative CM (exact diagnosis not reported in records I have). At that time, Charles Hawkins underwent cardiac workup and cMRI suggested infiltrative CM with normal EF. Has had exhaustive w/u since that time under the guidance of Charles Hawkins/Charles Hawkins at [facility] without clear diagnosis. 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   Saw Charles Hawkins in hematology and Charles Hawkins in Rheumatology and he was apparently told he did not have a systemic problem at that time.   Echo 10/21 EF 60% moderate RV HK. RVSP 34mHG. + MVP with mild to moderate MR Personally reviewed  He returns today for HF follow up.At last visit we ordered CPX testing for updated risk stratification. CPX test with very severe HF limitation. (See above). We also started Farxiga 10. (Has refused most HF meds in past). Says he could not tolerate Farxiga due to sluggishness. Was on it for about 3 months. Had bone marrow biopsy in 8/22 for leukocytopenia. No evidence of myelodysplastic or myeloproliferative disorder involving the bone marrow. However there was mild plasmacytosis representing 4%. Says he feels pretty good. Can do all ADLs without problem. Gets SOB if he does much more. No edema, orthopnea or PND. Denies ETOH or tobacco use.   CPX 05/15/21  FVC 1.93 (46%)      FEV1 1.49 (45%)        FEV1/FVC 77 (96%)        MVV 69 (45%)   Resting HR: 80 Standing HR: 82 Peak HR: 127   (58% age predicted max HR)  BP rest: 86/76 Standing BP: 74/40 BP peak: 88/72  Peak VO2: 9.8 (27% predicted peak VO2)  VE/VCO2 slope:  69  OUES: 0.70  Peak RER: 1.09  O2pulse:  5    (38% predicted O2pulse)   Studies:   - EM biopsy 11/14 - negative - Fat pad biopsy - neagtive - Serologies all normal  - cMRI in 5/15      1. Normal LV size and systolic function, EF 532% Mild LV     hypertrophy. Normal RV size and systolic function.     2.  Moderate to severe biatrial enlargement.     3. Evidence for diffuse infiltrative process by delayed enhancement     imaging, possible cardiac amyloidosis.  CPX 4/15  Resting HR: 75 Peak HR: 130 (72% age predicted max HR) BP rest: 102/58 BP peak: 128/62 Peak VO2: 13.9 (38.8% predicted peak VO2) VE/VCO2 slope: 41.4 OUES: 1.09 Peak RER: 1.13 Ventilatory Threshold: 10.2 (28.5% predicted peak VO2) Peak RR 32 Peak Ventilation: 46.3 VE/MVV: 32.2% PETCO2 at peak: 26 O2pulse: 8 (57% predicted O2pulse) - Echo (2/16) with EF 35-40%, thick LV and RV with starry night pattern, moderately hypokinetic RV, severe biatrial enlargement.   Review of systems complete and found to be negative unless listed in HPI.   Current Outpatient Medications on File Prior to Encounter  Medication Sig Dispense Refill   atorvastatin (LIPITOR) 20 MG tablet Take 1 tablet by mouth.     ELIQUIS 5 MG TABS tablet TAKE 1 TABLET (5 MG TOTAL) BY  MOUTH 2 (TWO) TIMES DAILY. 60 tablet 5   Ensure (ENSURE) Take 237 mLs by mouth as needed.     furosemide (LASIX) 80 MG tablet TAKE 1 TABLET BY MOUTH IN  THE MORNING AND 1/2 TAB IN  THE EVENING . MAY TAKE  ANOTHER 1/2 TABLET IN THE  EVENING AS NEEDED 126 tablet 0   levothyroxine (SYNTHROID) 25 MCG tablet Take 25 mcg by mouth daily.     Multiple Vitamin (MULTIVITAMIN WITH MINERALS) TABS tablet Take 1 tablet by mouth daily.      potassium chloride (MICRO-K) 10 MEQ CR capsule Take 4 capsules (40 mEq total) by mouth daily. 360 capsule 3   VITAMIN D, CHOLECALCIFEROL, PO Take 1 tablet by mouth once a week.     No current facility-administered medications on file prior to encounter.    PHYSICAL EXAM: Vitals:   06/16/21  1156  BP: 90/78  Pulse: 60  Weight: 68.9 kg (151 lb 12.8 oz)   Wt Readings from Last 3 Encounters:  06/16/21 68.9 kg (151 lb 12.8 oz)  05/31/21 70.8 kg (156 lb)  05/19/21 67.1 kg (148 lb)   General:  Thin appearing. No resp difficulty HEENT: normal Neck: supple. JVP 8-9. Carotids 2+ bilat; no bruits. No lymphadenopathy or thryomegaly appreciated. Cor: PMI nondisplaced. Irregular rate & rhythm. No rubs, gallops or murmurs. Lungs: clear Abdomen: soft, nontender, nondistended. No hepatosplenomegaly. No bruits or masses. Good bowel sounds. Extremities: no cyanosis, clubbing, rash, 2+ edema Neuro: alert & orientedx3, cranial nerves grossly intact. moves all 4 extremities w/o difficulty. Affect pleasant   ASSESSMENT & PLAN:  1. 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  - cMRI 5/15 Normal LV size and systolic function, EF 32%. Mild LV  hypertrophy. Normal RV size and systolic function, moderate to severe biatrial enlargement, Evidence for diffuse infiltrative process by delayed enhancement imaging, possible cardiac amyloidosis. - EM biopsy 2/14. Nonspecific myocardial degeneration. - Followed by [facility] for possible transplant. He has had transplant evaluation and was not listed due to tobacco and THC use. Last seen 11/2017 by [facility]. NYHA II at that time counseled to stop THC use and f/u yearly. He has not followed up  - Echo 10/21 EF 60% moderate RV HK. RVSP 4mHG. + MVP with mild to moderate MR  - CPX 7/22 with severe HF limitation. Peak VO2: 9.8 (27% predicted peak VO2) VE/VCO2 slope:  69  - Clinically reports NYHA II-III symptoms but not very active - Volume status elevated in setting of missing several doses of lasix - Increase lasix 80/40 -> 80 bid for several doses then back to 80/40 - Has had trouble tolerating GDMT - Stopped spiro  due to grogginess - Previously on entresto but stopped due to intolerance.  - Has previously refused coreg despite urgings by myself and Charles. PPosey Pronto - FWilder Gladestarted at last visit but he stopped after 2-3 months because he said it made him feel more sluggish - Long talk with him about CPX results and the fact that he is currently in window to qualify for transplant. I d/w Charles[facility] we shared the sentiment that he may not qualify for transplant due to medication intolerances. He became tearful in the office and said he would like to try to proceed with formal eval - Repeat cMRI to reassess degree of infiltrative  process and EF - Labs today   2. 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 Underwent endovascular revascularization of the right MCA  - No residual defects - Continue Eliquis. - Continue atorvastatin   3. A fibrillation/Flutter, chronic - Remains in Afib/Flutter. Rate controlled - Previously referred to EP for possible AFL ablation but did not go to appt.  - Continue eliquis 5 mg BID. Denies bleeding   4. Frequent PVCs - Previously taken off amio for afib due to low BP and hypothyroidism.  Total time spent 40 minutes. Over half that time spent discussing above.    Glori Bickers MD 06/16/2021

## 2021-06-16 NOTE — Patient Instructions (Addendum)
Increase Furosemide to 80 mg (1 tab) Twice daily FOR 2 DAYS ONLY, then back to 80 mg (1 tab) in AM and 40 mg (1/2 tab) in PM  Start Farxiga 5 mg Daily  Your physician has requested that you have a cardiac MRI. Cardiac MRI uses a computer to create images of your heart as its beating, producing both still and moving pictures of your heart and major blood vessels. For further information please visit [email or web address] Please follow the instruction sheet given to you today for more information. ONCE THIS IS APPROVED THROUGH YOUR INSURANCE WE WILL CALL YOU TO SCHEDULE THIS  You have been referred to Dr Posey Pronto at [facility], his office will call you for an appointment  Your physician recommends that you schedule a follow-up appointment in: 4 months  Do the following things EVERYDAY: Weigh yourself in the morning before breakfast. Write it down and keep it in a log. Take your medicines as prescribed Eat low salt foods--Limit salt (sodium) to 2000 mg per day.  Stay as active as you can everyday Limit all fluids for the day to less than 2 liters  If you have any questions or concerns before your next appointment please send [place] a message through [place] or call our office at [phone].    TO LEAVE A MESSAGE FOR THE NURSE SELECT OPTION 2, PLEASE LEAVE A MESSAGE INCLUDING: YOUR NAME DATE OF BIRTH CALL BACK NUMBER REASON FOR CALL**this is important as we prioritize the call backs  YOU WILL RECEIVE A CALL BACK THE SAME DAY AS LONG AS YOU CALL BEFORE 4:00 PM  At the [facility], you and your health needs are our priority. As part of our continuing mission to provide you with exceptional heart care, we have created designated Provider Care Teams. These Care Teams include your primary Cardiologist (physician) and Advanced Practice Providers (APPs- Physician Assistants and Nurse Practitioners) who all work together to provide you with the care you need, when you need it.   You may see any  of the following providers on your designated Care Team at your next follow up: Dr Glori Bickers Dr Loralie Champagne Dr Patrice Paradise, NP Lyda Jester, [place] Ginnie Smart Audry Riles, PharmD   Please be sure to bring in all your medications bottles to every appointment.

## 2021-06-23 ENCOUNTER — Other Ambulatory Visit (HOSPITAL_COMMUNITY): Payer: Self-pay

## 2021-06-23 MED ORDER — ATORVASTATIN CALCIUM 20 MG PO TABS: 20.0000 mg | ORAL_TABLET | Freq: Every day | ORAL | 1 refills | Status: AC

## 2021-06-29 NOTE — Addendum Note (Signed)
Encounter addended by: Scarlette Calico, RN on: 06/29/2021 4:32 PM  Actions taken: Clinical Note Signed

## 2021-06-29 NOTE — Progress Notes (Signed)
Referral for heart transplant eval faxed to [facility], Dr Posey Pronto at [phone]

## 2021-07-27 DIAGNOSIS — I425 Other restrictive cardiomyopathy: Secondary | ICD-10-CM | POA: Diagnosis not present

## 2021-07-27 DIAGNOSIS — I509 Heart failure, unspecified: Secondary | ICD-10-CM | POA: Diagnosis not present

## 2021-07-27 DIAGNOSIS — I110 Hypertensive heart disease with heart failure: Secondary | ICD-10-CM | POA: Diagnosis not present

## 2021-07-27 DIAGNOSIS — Z23 Encounter for immunization: Secondary | ICD-10-CM | POA: Diagnosis not present

## 2021-07-27 DIAGNOSIS — Z79899 Other long term (current) drug therapy: Secondary | ICD-10-CM | POA: Diagnosis not present

## 2021-07-27 DIAGNOSIS — Z6821 Body mass index (BMI) 21.0-21.9, adult: Secondary | ICD-10-CM | POA: Diagnosis not present

## 2021-07-27 DIAGNOSIS — R898 Other abnormal findings in specimens from other organs, systems and tissues: Secondary | ICD-10-CM | POA: Diagnosis not present

## 2021-07-27 DIAGNOSIS — R634 Abnormal weight loss: Secondary | ICD-10-CM | POA: Diagnosis not present

## 2021-08-14 DIAGNOSIS — R6889 Other general symptoms and signs: Secondary | ICD-10-CM | POA: Diagnosis not present

## 2021-08-14 DIAGNOSIS — J811 Chronic pulmonary edema: Secondary | ICD-10-CM | POA: Diagnosis not present

## 2021-08-14 DIAGNOSIS — J9811 Atelectasis: Secondary | ICD-10-CM | POA: Diagnosis not present

## 2021-08-14 DIAGNOSIS — Z01810 Encounter for preprocedural cardiovascular examination: Secondary | ICD-10-CM | POA: Diagnosis not present

## 2021-08-14 DIAGNOSIS — I517 Cardiomegaly: Secondary | ICD-10-CM | POA: Diagnosis not present

## 2021-08-14 DIAGNOSIS — Z7682 Awaiting organ transplant status: Secondary | ICD-10-CM | POA: Diagnosis not present

## 2021-08-14 DIAGNOSIS — J90 Pleural effusion, not elsewhere classified: Secondary | ICD-10-CM | POA: Diagnosis not present

## 2021-08-14 DIAGNOSIS — Z1159 Encounter for screening for other viral diseases: Secondary | ICD-10-CM | POA: Diagnosis not present

## 2021-08-14 DIAGNOSIS — Z1211 Encounter for screening for malignant neoplasm of colon: Secondary | ICD-10-CM | POA: Diagnosis not present

## 2021-08-14 DIAGNOSIS — Z01818 Encounter for other preprocedural examination: Secondary | ICD-10-CM | POA: Diagnosis not present

## 2021-08-14 DIAGNOSIS — I2510 Atherosclerotic heart disease of native coronary artery without angina pectoris: Secondary | ICD-10-CM | POA: Diagnosis not present

## 2021-08-15 DIAGNOSIS — I509 Heart failure, unspecified: Secondary | ICD-10-CM | POA: Diagnosis not present

## 2021-08-15 DIAGNOSIS — Z87891 Personal history of nicotine dependence: Secondary | ICD-10-CM | POA: Diagnosis not present

## 2021-08-15 DIAGNOSIS — Z7682 Awaiting organ transplant status: Secondary | ICD-10-CM | POA: Diagnosis not present

## 2021-08-15 DIAGNOSIS — Z01818 Encounter for other preprocedural examination: Secondary | ICD-10-CM | POA: Diagnosis not present

## 2021-08-15 DIAGNOSIS — R6889 Other general symptoms and signs: Secondary | ICD-10-CM | POA: Diagnosis not present

## 2021-08-15 DIAGNOSIS — I425 Other restrictive cardiomyopathy: Secondary | ICD-10-CM | POA: Diagnosis not present

## 2021-08-15 DIAGNOSIS — K8020 Calculus of gallbladder without cholecystitis without obstruction: Secondary | ICD-10-CM | POA: Diagnosis not present

## 2021-08-15 DIAGNOSIS — J90 Pleural effusion, not elsewhere classified: Secondary | ICD-10-CM | POA: Diagnosis not present

## 2021-08-15 DIAGNOSIS — Z01810 Encounter for preprocedural cardiovascular examination: Secondary | ICD-10-CM | POA: Diagnosis not present

## 2021-08-15 DIAGNOSIS — Z1211 Encounter for screening for malignant neoplasm of colon: Secondary | ICD-10-CM | POA: Diagnosis not present

## 2021-08-15 DIAGNOSIS — I7300 Raynaud's syndrome without gangrene: Secondary | ICD-10-CM | POA: Diagnosis not present

## 2021-08-15 DIAGNOSIS — Z008 Encounter for other general examination: Secondary | ICD-10-CM | POA: Diagnosis not present

## 2021-08-15 DIAGNOSIS — I422 Other hypertrophic cardiomyopathy: Secondary | ICD-10-CM | POA: Diagnosis not present

## 2021-08-15 DIAGNOSIS — Z8673 Personal history of transient ischemic attack (TIA), and cerebral infarction without residual deficits: Secondary | ICD-10-CM | POA: Diagnosis not present

## 2021-08-16 DIAGNOSIS — I4891 Unspecified atrial fibrillation: Secondary | ICD-10-CM | POA: Diagnosis not present

## 2021-08-16 DIAGNOSIS — Z8673 Personal history of transient ischemic attack (TIA), and cerebral infarction without residual deficits: Secondary | ICD-10-CM | POA: Diagnosis not present

## 2021-08-16 DIAGNOSIS — I509 Heart failure, unspecified: Secondary | ICD-10-CM | POA: Diagnosis not present

## 2021-08-16 DIAGNOSIS — Z01810 Encounter for preprocedural cardiovascular examination: Secondary | ICD-10-CM | POA: Diagnosis not present

## 2021-08-16 DIAGNOSIS — I110 Hypertensive heart disease with heart failure: Secondary | ICD-10-CM | POA: Diagnosis not present

## 2021-08-16 DIAGNOSIS — R6889 Other general symptoms and signs: Secondary | ICD-10-CM | POA: Diagnosis not present

## 2021-08-16 DIAGNOSIS — I2510 Atherosclerotic heart disease of native coronary artery without angina pectoris: Secondary | ICD-10-CM | POA: Diagnosis not present

## 2021-08-16 DIAGNOSIS — Z7901 Long term (current) use of anticoagulants: Secondary | ICD-10-CM | POA: Diagnosis not present

## 2021-08-16 DIAGNOSIS — I428 Other cardiomyopathies: Secondary | ICD-10-CM | POA: Diagnosis not present

## 2021-08-16 DIAGNOSIS — Z1211 Encounter for screening for malignant neoplasm of colon: Secondary | ICD-10-CM | POA: Diagnosis not present

## 2021-08-16 DIAGNOSIS — J90 Pleural effusion, not elsewhere classified: Secondary | ICD-10-CM | POA: Diagnosis not present

## 2021-08-16 DIAGNOSIS — Z008 Encounter for other general examination: Secondary | ICD-10-CM | POA: Diagnosis not present

## 2021-08-16 DIAGNOSIS — Z01818 Encounter for other preprocedural examination: Secondary | ICD-10-CM | POA: Diagnosis not present

## 2021-08-16 DIAGNOSIS — I422 Other hypertrophic cardiomyopathy: Secondary | ICD-10-CM | POA: Diagnosis not present

## 2021-08-16 DIAGNOSIS — Z7682 Awaiting organ transplant status: Secondary | ICD-10-CM | POA: Diagnosis not present

## 2021-08-16 DIAGNOSIS — Z87891 Personal history of nicotine dependence: Secondary | ICD-10-CM | POA: Diagnosis not present

## 2021-08-16 DIAGNOSIS — Z1159 Encounter for screening for other viral diseases: Secondary | ICD-10-CM | POA: Diagnosis not present

## 2021-08-16 DIAGNOSIS — I425 Other restrictive cardiomyopathy: Secondary | ICD-10-CM | POA: Diagnosis not present

## 2021-08-17 DIAGNOSIS — R6889 Other general symptoms and signs: Secondary | ICD-10-CM | POA: Diagnosis not present

## 2021-08-17 DIAGNOSIS — Z01818 Encounter for other preprocedural examination: Secondary | ICD-10-CM | POA: Diagnosis not present

## 2021-08-17 DIAGNOSIS — Z1211 Encounter for screening for malignant neoplasm of colon: Secondary | ICD-10-CM | POA: Diagnosis not present

## 2021-08-17 DIAGNOSIS — Z1159 Encounter for screening for other viral diseases: Secondary | ICD-10-CM | POA: Diagnosis not present

## 2021-08-17 DIAGNOSIS — Z7682 Awaiting organ transplant status: Secondary | ICD-10-CM | POA: Diagnosis not present

## 2021-08-17 DIAGNOSIS — Z01810 Encounter for preprocedural cardiovascular examination: Secondary | ICD-10-CM | POA: Diagnosis not present

## 2021-08-17 DIAGNOSIS — K6389 Other specified diseases of intestine: Secondary | ICD-10-CM | POA: Diagnosis not present

## 2021-08-18 DIAGNOSIS — Z01810 Encounter for preprocedural cardiovascular examination: Secondary | ICD-10-CM | POA: Diagnosis not present

## 2021-08-18 DIAGNOSIS — I10 Essential (primary) hypertension: Secondary | ICD-10-CM | POA: Diagnosis not present

## 2021-08-18 DIAGNOSIS — Z7682 Awaiting organ transplant status: Secondary | ICD-10-CM | POA: Diagnosis not present

## 2021-08-18 DIAGNOSIS — I425 Other restrictive cardiomyopathy: Secondary | ICD-10-CM | POA: Diagnosis not present

## 2021-08-18 DIAGNOSIS — R6889 Other general symptoms and signs: Secondary | ICD-10-CM | POA: Diagnosis not present

## 2021-08-18 DIAGNOSIS — I428 Other cardiomyopathies: Secondary | ICD-10-CM | POA: Diagnosis not present

## 2021-08-18 DIAGNOSIS — R9431 Abnormal electrocardiogram [ECG] [EKG]: Secondary | ICD-10-CM | POA: Diagnosis not present

## 2021-08-18 DIAGNOSIS — I4891 Unspecified atrial fibrillation: Secondary | ICD-10-CM | POA: Diagnosis not present

## 2021-08-20 DIAGNOSIS — Z008 Encounter for other general examination: Secondary | ICD-10-CM | POA: Diagnosis not present

## 2021-08-20 DIAGNOSIS — I422 Other hypertrophic cardiomyopathy: Secondary | ICD-10-CM | POA: Diagnosis not present

## 2021-08-20 DIAGNOSIS — Z7682 Awaiting organ transplant status: Secondary | ICD-10-CM | POA: Diagnosis not present

## 2021-08-29 ENCOUNTER — Telehealth (HOSPITAL_COMMUNITY): Payer: Self-pay | Admitting: *Deleted

## 2021-08-29 NOTE — Telephone Encounter (Signed)
Attempted to call patient regarding upcoming cardiac MRI appointment. Left message on voicemail with name and callback number  Jennie Hannay RN Navigator Cardiac Imaging [facility] [phone] Office [phone] Cell  

## 2021-08-30 ENCOUNTER — Ambulatory Visit (HOSPITAL_COMMUNITY)
Admission: RE | Admit: 2021-08-30 | Discharge: 2021-08-30 | Disposition: A | Discharge status: Home / Self Care | Payer: Medicare Other | Source: Ambulatory Visit | Attending: Internal Medicine | Admitting: Internal Medicine

## 2021-08-30 ENCOUNTER — Other Ambulatory Visit: Payer: Self-pay

## 2021-08-30 DIAGNOSIS — I5042 Chronic combined systolic (congestive) and diastolic (congestive) heart failure: Secondary | ICD-10-CM

## 2021-08-30 MED ORDER — GADOBUTROL 1 MMOL/ML IV SOLN
10.0000 mL | Freq: Once | INTRAVENOUS | Status: AC | PRN
  Administered 2021-08-30: 10 mL via INTRAVENOUS

## 2021-09-07 ENCOUNTER — Other Ambulatory Visit (HOSPITAL_COMMUNITY): Payer: Self-pay | Admitting: Internal Medicine

## 2021-09-13 DIAGNOSIS — R4020 Unspecified coma: Secondary | ICD-10-CM | POA: Diagnosis not present

## 2021-09-28 DIAGNOSIS — 419620001 Death: Secondary | SNOMED CT | POA: Diagnosis not present

## 2021-09-28 DEATH — deceased

## 2021-10-13 ENCOUNTER — Inpatient Hospital Stay (HOSPITAL_COMMUNITY)
Admission: RE | Admit: 2021-10-13 | Discharge: 2021-10-13 | Disposition: A | Discharge status: Home / Self Care | Payer: Medicare Other | Source: Ambulatory Visit | Attending: Internal Medicine | Admitting: Internal Medicine

## 2021-10-13 DIAGNOSIS — I420 Dilated cardiomyopathy: Secondary | ICD-10-CM

## 2021-10-13 NOTE — Progress Notes (Signed)
Patient ID: Charles Hawkins, male   DOB: 10/13/1971, 50 y.o.   MRN: [id number]     Patient did not show for appt. Note left for templating purposes only     ADVANCED HF CLINIC CONSULT NOTE  HF: Charles Hawkins   HPI: Charles Hawkins is a 50 y.o. 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  Son had heart transplant at 48 age 48 months old due to reported infiltrative CM (exact diagnosis not reported in records I have). At that time, Charles Hawkins underwent cardiac workup and cMRI suggested infiltrative CM with normal EF. Has had exhaustive w/u since that time under the guidance of Drs. Charles Hawkins/Charles Hawkins at [facility] without clear diagnosis. 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  Saw Charles. Amalia Hawkins in hematology and Charles Hawkins in Rheumatology and he was apparently told he did not have a systemic problem at that time. Had bone marrow biopsy in 8/22 for leukocytopenia. No evidence of myelodysplastic or myeloproliferative disorder involving the bone marrow. However there was mild plasmacytosis representing 4%.  Echo 10/21 EF 60% moderate RV HK. RVSP 62mHG. + MVP with mild to moderate MR  CPX test 7/22 with very severe HF limitation. Referred back to [facility] for transplant eval. Saw Charles. PPosey Hawkins on 08/18/21. Transplant w/u begun.Underwent RHC. PCW 25 CI 2.2   CPX 05/15/21  FVC 1.93 (46%)      FEV1 1.49 (45%)        FEV1/FVC 77 (96%)        MVV 69 (45%)   Resting HR: 80 Standing HR: 82 Peak HR: 127   (58% age predicted max HR)  BP rest: 86/76 Standing BP: 74/40 BP peak: 88/72  Peak VO2: 9.8 (27% predicted peak VO2)  VE/VCO2 slope:  69  OUES: 0.70  Peak RER: 1.09  O2pulse:  5   (38% predicted O2pulse)   Studies:   - EM biopsy 11/14 - negative - Fat pad biopsy - neagtive - Serologies all normal  - cMRI in 5/15      1. Normal LV size and  systolic function, EF 584% Mild LV     hypertrophy. Normal RV size and systolic function.     2.  Moderate to severe biatrial enlargement.     3. Evidence for diffuse infiltrative process by delayed enhancement     imaging, possible cardiac amyloidosis.  CPX 4/15  Resting HR: 75 Peak HR: 130 (72% age predicted max HR) BP rest: 102/58 BP peak: 128/62 Peak VO2: 13.9 (38.8% predicted peak VO2) VE/VCO2 slope: 41.4 OUES: 1.09 Peak RER: 1.13 Ventilatory Threshold: 10.2 (28.5% predicted peak VO2) Peak RR 32 Peak Ventilation: 46.3 VE/MVV: 32.2% PETCO2 at peak: 26 O2pulse: 8 (57% predicted O2pulse) - Echo (2/16) with EF 35-40%, thick LV and RV with starry night pattern, moderately hypokinetic RV, severe biatrial enlargement.   Review of systems complete and found to be negative unless listed in HPI.   Current Outpatient Medications on File Prior to Encounter  Medication Sig Dispense Refill   atorvastatin (LIPITOR) 20 MG tablet Take 1 tablet (20 mg total) by mouth daily. 90 tablet 1   dapagliflozin propanediol (FARXIGA) 5 MG TABS tablet Take 1 tablet (5 mg total) by mouth daily before breakfast. 30 tablet 6   ELIQUIS 5 MG TABS tablet TAKE 1 TABLET (5 MG TOTAL) BY MOUTH 2 (TWO) TIMES DAILY.  60 tablet 5   Ensure (ENSURE) Take 237 mLs by mouth as needed.     furosemide (LASIX) 80 MG tablet TAKE 1 TABLET BY MOUTH IN  THE MORNING AND 1/2 TAB IN  THE EVENING . MAY TAKE  ANOTHER 1/2 TABLET IN THE  EVENING AS NEEDED 126 tablet 0   levothyroxine (SYNTHROID) 25 MCG tablet Take 25 mcg by mouth daily.     Multiple Vitamin (MULTIVITAMIN WITH MINERALS) TABS tablet Take 1 tablet by mouth daily.      potassium chloride (MICRO-K) 10 MEQ CR capsule TAKE 4 CAPSULES BY MOUTH  DAILY 360 capsule 3   VITAMIN D, CHOLECALCIFEROL, PO Take 1 tablet by mouth once a week.     No current facility-administered medications on file prior to encounter.    PHYSICAL EXAM: There were no vitals filed for this visit.  Wt  Readings from Last 3 Encounters:  06/16/21 68.9 kg (151 lb 12.8 oz)  05/31/21 70.8 kg (156 lb)  05/19/21 67.1 kg (148 lb)   General:  Thin appearing. No resp difficulty HEENT: normal Neck: supple. JVP 8-9. Carotids 2+ bilat; no bruits. No lymphadenopathy or thryomegaly appreciated. Cor: PMI nondisplaced. Irregular rate & rhythm. No rubs, gallops or murmurs. Lungs: clear Abdomen: soft, nontender, nondistended. No hepatosplenomegaly. No bruits or masses. Good bowel sounds. Extremities: no cyanosis, clubbing, rash, 2+ edema Neuro: alert & orientedx3, cranial nerves grossly intact. moves all 4 extremities w/o difficulty. Affect pleasant   ASSESSMENT & PLAN:  1. 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  - cMRI 5/15 Normal LV size and systolic function, EF 11%. Mild LV  hypertrophy. Normal RV size and systolic function, moderate to severe biatrial enlargement, Evidence for diffuse infiltrative process by delayed enhancement imaging, possible cardiac amyloidosis. - EM biopsy 2/14. Nonspecific myocardial degeneration. - His cardiomyopathy has now been genotyped and he has a pathogenic variant MYH7 [phone]. - Echo 10/21 EF 60% moderate RV HK. RVSP 39mHG. + MVP with mild to moderate MR  - CPX 7/22 with severe HF limitation. Peak VO2: 9.8 (27% predicted peak VO2) VE/VCO2 slope:  69  - Referred back to Charles Hawkins and transplant w/u initiated - NYHA  - Volume status elevated in setting of missing several doses of lasix - Continue lasix 80/40 -> 80 bid for several doses then back to 80/40 - Has had trouble tolerating GDMT - Stopped spiro due to grogginess - Previously on entresto but stopped due to intolerance.  - Has previously refused coreg despite urgings by myself and Charles. PPosey Hawkins  2. CVA 04/2018 - CTA 04/2018 -> right M1 MCA occlusion d/t  embolic stroke. Underwent endovascular revascularization of the right MCA  - No residual defects - Continue Eliquis. - Continue atorvastatin   3. A fibrillation/Flutter, chronic - Remains in Afib/Flutter. Rate controlled - Previously referred to EP for possible AFL ablation but did not go to appt.  - Continue eliquis 5 mg BID. Denies bleeding   4. Frequent PVCs - Previously taken off amio for afib due to low BP and hypothyroidism.  DGlori BickersMD 10/13/2021

## 2021-11-30 ENCOUNTER — Ambulatory Visit: Payer: Medicare Other | Admitting: Internal Medicine

## 2021-11-30 ENCOUNTER — Other Ambulatory Visit: Payer: Medicare Other

## 2021-12-06 ENCOUNTER — Inpatient Hospital Stay: Payer: Medicaid Other | Admitting: Internal Medicine

## 2021-12-06 ENCOUNTER — Inpatient Hospital Stay: Payer: Medicaid Other | Attending: Internal Medicine

## 2021-12-07 ENCOUNTER — Telehealth: Payer: Self-pay | Admitting: Internal Medicine

## 2021-12-07 NOTE — Telephone Encounter (Signed)
Called pt to r/s missed appt per 2/8 inbasket, pt significant other, Danae Chen, who informed me PT had passed in 10-04-2023. Offered Condolences. Notified desk nurse.
